# Patient Record
Sex: Male | Born: 1950
Health system: Southern US, Community
[De-identification: ages and names within clinical notes are randomized; demographics above are authoritative.]

## PROBLEM LIST (undated history)

## (undated) DIAGNOSIS — Z8489 Family history of other specified conditions: Secondary | ICD-10-CM

## (undated) DIAGNOSIS — K449 Diaphragmatic hernia without obstruction or gangrene: Secondary | ICD-10-CM

## (undated) DIAGNOSIS — Z860101 Personal history of adenomatous and serrated colon polyps: Secondary | ICD-10-CM

## (undated) DIAGNOSIS — K760 Fatty (change of) liver, not elsewhere classified: Secondary | ICD-10-CM

## (undated) DIAGNOSIS — C189 Malignant neoplasm of colon, unspecified: Secondary | ICD-10-CM

## (undated) DIAGNOSIS — T7840XA Allergy, unspecified, initial encounter: Secondary | ICD-10-CM

## (undated) DIAGNOSIS — J45909 Unspecified asthma, uncomplicated: Secondary | ICD-10-CM

## (undated) DIAGNOSIS — M1990 Unspecified osteoarthritis, unspecified site: Secondary | ICD-10-CM

## (undated) DIAGNOSIS — K219 Gastro-esophageal reflux disease without esophagitis: Secondary | ICD-10-CM

## (undated) DIAGNOSIS — Z86010 Personal history of colonic polyps: Secondary | ICD-10-CM

## (undated) DIAGNOSIS — N200 Calculus of kidney: Secondary | ICD-10-CM

## (undated) DIAGNOSIS — C61 Malignant neoplasm of prostate: Secondary | ICD-10-CM

## (undated) DIAGNOSIS — E785 Hyperlipidemia, unspecified: Secondary | ICD-10-CM

## (undated) DIAGNOSIS — D126 Benign neoplasm of colon, unspecified: Secondary | ICD-10-CM

## (undated) DIAGNOSIS — K648 Other hemorrhoids: Secondary | ICD-10-CM

## (undated) DIAGNOSIS — Z87442 Personal history of urinary calculi: Secondary | ICD-10-CM

## (undated) DIAGNOSIS — I10 Essential (primary) hypertension: Secondary | ICD-10-CM

## (undated) DIAGNOSIS — Z973 Presence of spectacles and contact lenses: Secondary | ICD-10-CM

## (undated) HISTORY — DX: Allergy, unspecified, initial encounter: T78.40XA

## (undated) HISTORY — DX: Other hemorrhoids: K64.8

## (undated) HISTORY — DX: Hyperlipidemia, unspecified: E78.5

## (undated) HISTORY — PX: VASECTOMY: SHX75

## (undated) HISTORY — DX: Gastro-esophageal reflux disease without esophagitis: K21.9

## (undated) HISTORY — DX: Essential (primary) hypertension: I10

## (undated) HISTORY — DX: Unspecified asthma, uncomplicated: J45.909

## (undated) HISTORY — DX: Calculus of kidney: N20.0

## (undated) HISTORY — PX: COLONOSCOPY: SHX174

## (undated) HISTORY — DX: Hereditary hemochromatosis: E83.110

## (undated) HISTORY — DX: Benign neoplasm of colon, unspecified: D12.6

## (undated) HISTORY — DX: Malignant neoplasm of colon, unspecified: C18.9

---
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
## 2003-12-25 HISTORY — PX: BUNIONECTOMY: SHX129

## 2011-12-28 ENCOUNTER — Ambulatory Visit: Payer: Managed Care, Other (non HMO)

## 2011-12-28 DIAGNOSIS — M25569 Pain in unspecified knee: Secondary | ICD-10-CM

## 2011-12-28 DIAGNOSIS — R05 Cough: Secondary | ICD-10-CM

## 2011-12-28 DIAGNOSIS — R059 Cough, unspecified: Secondary | ICD-10-CM

## 2012-06-24 ENCOUNTER — Telehealth: Payer: Self-pay

## 2012-06-24 NOTE — Telephone Encounter (Signed)
rx approved for 36 months.  lmom for pt and called pharmacy.  Pharmacy ran rx thru with no problem

## 2012-06-24 NOTE — Telephone Encounter (Signed)
VICKI STATES SHE AND HER HUSBAND HAVE BEEN DEALING WITH HIS MEDICINE FOR 2 WKS NOW AND HE IS DOWN TO HIS LAST PILL AND THEY WANT THIS TAKEN CARE OF ASAP. IT IS HIS OMEPRAZOLE. PLEASE CALL [phone]

## 2012-07-22 ENCOUNTER — Ambulatory Visit (INDEPENDENT_AMBULATORY_CARE_PROVIDER_SITE_OTHER): Payer: Managed Care, Other (non HMO) | Admitting: Emergency Medicine

## 2012-07-22 ENCOUNTER — Encounter: Payer: Self-pay | Admitting: Emergency Medicine

## 2012-07-22 ENCOUNTER — Telehealth: Payer: Self-pay

## 2012-07-22 VITALS — BP 138/94 | HR 77 | Temp 98.1°F | Resp 16 | Ht 70.0 in | Wt 223.0 lb

## 2012-07-22 DIAGNOSIS — K219 Gastro-esophageal reflux disease without esophagitis: Secondary | ICD-10-CM

## 2012-07-22 DIAGNOSIS — Z0000 Encounter for general adult medical examination without abnormal findings: Secondary | ICD-10-CM

## 2012-07-22 LAB — COMPREHENSIVE METABOLIC PANEL
ALT: 60 U/L — ABNORMAL HIGH (ref 0–53)
AST: 37 U/L (ref 0–37)
Albumin: 4.7 g/dL (ref 3.5–5.2)
Alkaline Phosphatase: 93 U/L (ref 39–117)
BUN: 22 mg/dL (ref 6–23)
CO2: 22 mEq/L (ref 19–32)
Calcium: 9.4 mg/dL (ref 8.4–10.5)
Chloride: 105 mEq/L (ref 96–112)
Creat: 1.09 mg/dL (ref 0.50–1.35)
Glucose, Bld: 107 mg/dL — ABNORMAL HIGH (ref 70–99)
Potassium: 4.3 mEq/L (ref 3.5–5.3)
Sodium: 141 mEq/L (ref 135–145)
Total Bilirubin: 0.9 mg/dL (ref 0.3–1.2)
Total Protein: 6.6 g/dL (ref 6.0–8.3)

## 2012-07-22 LAB — POCT UA - MICROSCOPIC ONLY
Bacteria, U Microscopic: NEGATIVE
Casts, Ur, LPF, POC: NEGATIVE
Crystals, Ur, HPF, POC: NEGATIVE
Yeast, UA: NEGATIVE

## 2012-07-22 LAB — CBC WITH DIFFERENTIAL/PLATELET
Basophils Absolute: 0.1 10*3/uL (ref 0.0–0.1)
Basophils Relative: 1 % (ref 0–1)
Eosinophils Absolute: 0.3 10*3/uL (ref 0.0–0.7)
Eosinophils Relative: 6 % — ABNORMAL HIGH (ref 0–5)
HCT: 45.1 % (ref 39.0–52.0)
Hemoglobin: 16.6 g/dL (ref 13.0–17.0)
Lymphocytes Relative: 22 % (ref 12–46)
Lymphs Abs: 1.1 10*3/uL (ref 0.7–4.0)
MCH: 32.4 pg (ref 26.0–34.0)
MCHC: 36.8 g/dL — ABNORMAL HIGH (ref 30.0–36.0)
MCV: 88.1 fL (ref 78.0–100.0)
Monocytes Absolute: 0.5 10*3/uL (ref 0.1–1.0)
Monocytes Relative: 9 % (ref 3–12)
Neutro Abs: 3.3 10*3/uL (ref 1.7–7.7)
Neutrophils Relative %: 62 % (ref 43–77)
Platelets: 246 10*3/uL (ref 150–400)
RBC: 5.12 MIL/uL (ref 4.22–5.81)
RDW: 12.6 % (ref 11.5–15.5)
WBC: 5.3 10*3/uL (ref 4.0–10.5)

## 2012-07-22 LAB — POCT URINALYSIS DIPSTICK
Bilirubin, UA: NEGATIVE
Blood, UA: NEGATIVE
Glucose, UA: NEGATIVE
Ketones, UA: NEGATIVE
Leukocytes, UA: NEGATIVE
Nitrite, UA: NEGATIVE
Protein, UA: NEGATIVE
Spec Grav, UA: 1.02
Urobilinogen, UA: 0.2
pH, UA: 7

## 2012-07-22 LAB — LIPID PANEL
Cholesterol: 190 mg/dL (ref 0–200)
HDL: 43 mg/dL (ref >39)
LDL Cholesterol: 103 mg/dL — ABNORMAL HIGH (ref 0–99)
Total CHOL/HDL Ratio: 4.4 Ratio
Triglycerides: 222 mg/dL — ABNORMAL HIGH (ref <150)
VLDL: 44 mg/dL — ABNORMAL HIGH (ref 0–40)

## 2012-07-22 LAB — IFOBT (OCCULT BLOOD): IFOBT: NEGATIVE

## 2012-07-22 LAB — PSA: PSA: 1.67 ng/mL (ref <=4.00)

## 2012-07-22 LAB — TSH: TSH: 1.749 u[IU]/mL (ref 0.350–4.500)

## 2012-07-22 NOTE — Telephone Encounter (Signed)
Pt would like to come by and pick up a copy of his lab results from 07/22/12

## 2012-07-22 NOTE — Progress Notes (Signed)
  Subjective:    Patient ID: Trisha Morandi, male    DOB: 08/14/1951, 61 y.o.   MRN: [id number]  HPI    Review of Systems  Constitutional: Negative.   HENT: Negative.   Eyes: Negative.   Respiratory: Negative.   Cardiovascular: Negative.   Gastrointestinal: Negative.   Genitourinary: Negative.   Musculoskeletal: Negative.   Skin: Negative.   Neurological: Negative.   Hematological: Negative.   Psychiatric/Behavioral: Negative.        Objective:   Physical Exam HEENT exam is unremarkable. His neck is supple. Thyroid not enlarged. Chest clear to auscultation and percussion. Heart regular rate no murmurs. Abdomen soft nontender. Extremities without edema. He has an absent right patella. Extremities are without edema. Pulses are 2+ and symmetrical. Ekg shows J-point depression no acute changes  Results for orders placed in visit on 07/22/12  IFOBT (OCCULT BLOOD)      Component Value Range   IFOBT Negative    POCT URINALYSIS DIPSTICK      Component Value Range   Color, UA yellow     Clarity, UA slightly cloudy     Glucose, UA neg     Bilirubin, UA neg     Ketones, UA neg     Spec Grav, UA 1.020     Blood, UA neg     pH, UA 7.0     Protein, UA neg     Urobilinogen, UA 0.2     Nitrite, UA neg     Leukocytes, UA Negative    POCT UA - MICROSCOPIC ONLY      Component Value Range   WBC, Ur, HPF, POC 0-4     RBC, urine, microscopic 0-2     Bacteria, U Microscopic neg     Mucus, UA small     Epithelial cells, urine per micros 0-3     Crystals, Ur, HPF, POC neg     Casts, Ur, LPF, POC neg     Yeast, UA neg         Assessment & Plan:  Patient is stable at present. He is up-to-date on colonoscopy. No change in current treatment protocol the I encouraged him to lose weight and exercise. We'll recheck on yearly basis.

## 2012-07-22 NOTE — Progress Notes (Signed)
  Subjective:    Patient ID: Randall Turner, male    DOB: 12-20-1951, 61 y.o.   MRN: [id number]  HPI    Review of Systems     Objective:   Physical Exam        Assessment & Plan:

## 2012-07-23 NOTE — Telephone Encounter (Signed)
Records are ready for patient to come by and pick-up at 102.  Please call patient to inform patient.

## 2012-07-23 NOTE — Telephone Encounter (Signed)
LMOM regarding lab results are ready for pick-up.

## 2012-09-08 ENCOUNTER — Other Ambulatory Visit: Payer: Self-pay | Admitting: Physician Assistant

## 2012-11-11 ENCOUNTER — Ambulatory Visit: Payer: Managed Care, Other (non HMO)

## 2012-11-11 ENCOUNTER — Ambulatory Visit: Payer: Managed Care, Other (non HMO) | Admitting: Internal Medicine

## 2012-11-11 VITALS — BP 128/72 | HR 98 | Temp 98.0°F | Resp 17 | Ht 70.0 in | Wt 229.0 lb

## 2012-11-11 DIAGNOSIS — M25561 Pain in right knee: Secondary | ICD-10-CM

## 2012-11-11 DIAGNOSIS — M1990 Unspecified osteoarthritis, unspecified site: Secondary | ICD-10-CM

## 2012-11-11 DIAGNOSIS — M25569 Pain in unspecified knee: Secondary | ICD-10-CM

## 2012-11-11 NOTE — Patient Instructions (Signed)
Osteoarthritis Osteoarthritis is the most common form of arthritis. It is redness, soreness, and swelling (inflammation) affecting the cartilage. Cartilage acts as a cushion, covering the ends of bones where they meet to form a joint. CAUSES  Over time, the cartilage begins to wear away. This causes bone to rub on bone. This produces pain and stiffness in the affected joints. Factors that contribute to this problem are:  Excessive body weight.  Age.  Overuse of joints. SYMPTOMS   People with osteoarthritis usually experience joint pain, swelling, or stiffness.  Over time, the joint may lose its normal shape.  Small deposits of bone (osteophytes) may grow on the edges of the joint.  Bits of bone or cartilage can break off and float inside the joint space. This may cause more pain and damage.  Osteoarthritis can lead to depression, anxiety, feelings of helplessness, and limitations on daily activities. The most commonly affected joints are in the:  Ends of the fingers.  Thumbs.  Neck.  Lower back.  Knees.  Hips. DIAGNOSIS  Diagnosis is mostly based on your symptoms and exam. Tests may be helpful, including:  X-rays of the affected joint.  A computerized magnetic scan (MRI).  Blood tests to rule out other types of arthritis.  Joint fluid tests. This involves using a needle to draw fluid from the joint and examining the fluid under a microscope. TREATMENT  Goals of treatment are to control pain, improve joint function, maintain a normal body weight, and maintain a healthy lifestyle. Treatment approaches may include:  A prescribed exercise program with rest and joint relief.  Weight control with nutritional education.  Pain relief techniques such as:  Properly applied heat and cold.  Electric pulses delivered to nerve endings under the skin (transcutaneous electrical nerve stimulation, TENS).  Massage.  Certain supplements. Ask your caregiver before using any  supplements, especially in combination with prescribed drugs.  Medicines to control pain, such as:  Acetaminophen.  Nonsteroidal anti-inflammatory drugs (NSAIDs), such as naproxen.  Narcotic or central-acting agents, such as tramadol. This drug carries a risk of addiction and is generally prescribed for short-term use.  Corticosteroids. These can be given orally or as injection. This is a short-term treatment, not recommended for routine use.  Surgery to reposition the bones and relieve pain (osteotomy) or to remove loose pieces of bone and cartilage. Joint replacement may be needed in advanced states of osteoarthritis. HOME CARE INSTRUCTIONS  Your caregiver can recommend specific types of exercise. These may include:  Strengthening exercises. These are done to strengthen the muscles that support joints affected by arthritis. They can be performed with weights or with exercise bands to add resistance.  Aerobic activities. These are exercises, such as brisk walking or low-impact aerobics, that get your heart pumping. They can help keep your lungs and circulatory system in shape.  Range-of-motion activities. These keep your joints limber.  Balance and agility exercises. These help you maintain daily living skills. Learning about your condition and being actively involved in your care will help improve the course of your osteoarthritis. SEEK MEDICAL CARE IF:   You feel hot or your skin turns red.  You develop a rash in addition to your joint pain.  You have an oral temperature above 102 F (38.9 C). [place] of Arthritis and Musculoskeletal and Skin Diseases: www.niams.SouthExposed.es [employer] on Aging: http://kim-miller.com/ American College of Rheumatology: www.rheumatology.org Document Released: 12/10/2005 Document Revised: 03/03/2012 Document Reviewed: 03/23/2010 [facility] Patient Information 2013 [place].  Wear and Tear Disorders of the Knee  (Arthritis, Osteoarthritis) Everyone will experience wear and tear injuries (arthritis, osteoarthritis) of the knee. These are the changes we all get as we age. They come from the joint stress of daily living. The amount of cartilage damage in your knee and your symptoms determine if you need surgery. Mild problems require approximately two months recovery time. More severe problems take several months to recover. With mild problems, your surgeon may find worn and rough cartilage surfaces. With severe changes, your surgeon may find cartilage that has completely worn away and exposed the bone. Loose bodies of bone and cartilage, bone spurs (excess bone growth), and injuries to the menisci (cushions between the large bones of your leg) are also common. All of these problems can cause pain. For a mild wear and tear problem, rough cartilage may simply need to be shaved and smoothed. For more severe problems with areas of exposed bone, your surgeon may use an instrument for roughing up the bone surfaces to stimulate new cartilage growth. Loose bodies are usually removed. Torn menisci may be trimmed or repaired. ABOUT THE ARTHROSCOPIC PROCEDURE Arthroscopy is a surgical technique. It allows your orthopedic surgeon to diagnose and treat your knee injury with accuracy. The surgeon looks into your knee through a small scope. The scope is like a small (pencil-sized) telescope. Arthroscopy is less invasive than open knee surgery. You can expect a more rapid recovery. After the procedure, you will be moved to a recovery area until most of the effects of the medication have worn off. Your caregiver will discuss the test results with you. RECOVERY The severity of the arthritis and the type of procedure performed will determine recovery time. Other important factors include age, physical condition, medical conditions, and the type of rehabilitation program. Strengthening your muscles after arthroscopy helps guarantee a  better recovery. Follow your caregiver's instructions. Use crutches, rest, elevate, ice, and do knee exercises as instructed. Your caregivers will help you and instruct you with exercises and other physical therapy required to regain your mobility, muscle strength, and functioning following surgery. Only take over-the-counter or prescription medicines for pain, discomfort, or fever as directed by your caregiver.  SEEK MEDICAL CARE IF:   There is increased bleeding (more than a small spot) from the wound.  You notice redness, swelling, or increasing pain in the wound.  Pus is coming from wound.  You develop an unexplained oral temperature above 102 F (38.9 C) , or as your caregiver suggests.  You notice a foul smell coming from the wound or dressing.  You have severe pain with motion of the knee. SEEK IMMEDIATE MEDICAL CARE IF:   You develop a rash.  You have difficulty breathing.  You have any allergic problems. MAKE SURE YOU:   Understand these instructions.  Will watch your condition.  Will get help right away if you are not doing well or get worse. Document Released: 12/07/2000 Document Revised: 03/03/2012 Document Reviewed: 05/05/2008 [facility] Patient Information 2013 [place], [place].

## 2012-11-11 NOTE — Progress Notes (Signed)
  Subjective:    Patient ID: Randall Turner, male    DOB: 1951-04-19, 61 y.o.   MRN: [id number]  HPI Knee pain for 35 yrs Hx of serious knee surgery at 17 Has weakness.   Review of Systems     Objective:   Physical Exam Swollen knee Good rom Joint stable Muscles weak NMS intact  [facility] reading (PRIMARY) by  Dr.Ariyana Faw patella disintegrated, DJD,chondrcalcinosis.       Assessment & Plan:  Pain/Swelling knee Refer to Dr. Berton Lan

## 2013-03-18 ENCOUNTER — Other Ambulatory Visit: Payer: Self-pay | Admitting: Physician Assistant

## 2013-06-15 ENCOUNTER — Other Ambulatory Visit: Payer: Self-pay | Admitting: Physician Assistant

## 2013-07-28 ENCOUNTER — Ambulatory Visit (INDEPENDENT_AMBULATORY_CARE_PROVIDER_SITE_OTHER): Payer: Managed Care, Other (non HMO) | Admitting: Emergency Medicine

## 2013-07-28 ENCOUNTER — Encounter: Payer: Self-pay | Admitting: Emergency Medicine

## 2013-07-28 VITALS — BP 134/96 | HR 84 | Temp 97.5°F | Resp 16 | Ht 70.0 in | Wt 225.0 lb

## 2013-07-28 DIAGNOSIS — K219 Gastro-esophageal reflux disease without esophagitis: Secondary | ICD-10-CM | POA: Insufficient documentation

## 2013-07-28 DIAGNOSIS — Z0000 Encounter for general adult medical examination without abnormal findings: Secondary | ICD-10-CM

## 2013-07-28 DIAGNOSIS — R7309 Other abnormal glucose: Secondary | ICD-10-CM

## 2013-07-28 DIAGNOSIS — R739 Hyperglycemia, unspecified: Secondary | ICD-10-CM | POA: Insufficient documentation

## 2013-07-28 DIAGNOSIS — E785 Hyperlipidemia, unspecified: Secondary | ICD-10-CM

## 2013-07-28 DIAGNOSIS — Z1211 Encounter for screening for malignant neoplasm of colon: Secondary | ICD-10-CM

## 2013-07-28 DIAGNOSIS — R635 Abnormal weight gain: Secondary | ICD-10-CM

## 2013-07-28 DIAGNOSIS — R8281 Pyuria: Secondary | ICD-10-CM

## 2013-07-28 LAB — CBC WITH DIFFERENTIAL/PLATELET
Basophils Absolute: 0.1 10*3/uL (ref 0.0–0.1)
Basophils Relative: 1 % (ref 0–1)
Eosinophils Absolute: 0.3 10*3/uL (ref 0.0–0.7)
Eosinophils Relative: 5 % (ref 0–5)
HCT: 48.4 % (ref 39.0–52.0)
Hemoglobin: 17.6 g/dL — ABNORMAL HIGH (ref 13.0–17.0)
Lymphocytes Relative: 30 % (ref 12–46)
Lymphs Abs: 1.5 10*3/uL (ref 0.7–4.0)
MCH: 32.2 pg (ref 26.0–34.0)
MCHC: 36.4 g/dL — ABNORMAL HIGH (ref 30.0–36.0)
MCV: 88.6 fL (ref 78.0–100.0)
Monocytes Absolute: 0.5 10*3/uL (ref 0.1–1.0)
Monocytes Relative: 9 % (ref 3–12)
Neutro Abs: 2.8 10*3/uL (ref 1.7–7.7)
Neutrophils Relative %: 55 % (ref 43–77)
Platelets: 229 10*3/uL (ref 150–400)
RBC: 5.46 MIL/uL (ref 4.22–5.81)
RDW: 12.6 % (ref 11.5–15.5)
WBC: 5.2 10*3/uL (ref 4.0–10.5)

## 2013-07-28 LAB — POCT UA - MICROSCOPIC ONLY
Bacteria, U Microscopic: NEGATIVE
Casts, Ur, LPF, POC: NEGATIVE
Crystals, Ur, HPF, POC: NEGATIVE
Yeast, UA: NEGATIVE

## 2013-07-28 LAB — POCT GLYCOSYLATED HEMOGLOBIN (HGB A1C): Hemoglobin A1C: 4.8

## 2013-07-28 LAB — POCT URINALYSIS DIPSTICK
Bilirubin, UA: NEGATIVE
Glucose, UA: NEGATIVE
Ketones, UA: NEGATIVE
Nitrite, UA: NEGATIVE
Protein, UA: NEGATIVE
Spec Grav, UA: 1.025
Urobilinogen, UA: 0.2
pH, UA: 5.5

## 2013-07-28 LAB — COMPREHENSIVE METABOLIC PANEL
ALT: 46 U/L (ref 0–53)
AST: 31 U/L (ref 0–37)
Albumin: 4.5 g/dL (ref 3.5–5.2)
Alkaline Phosphatase: 93 U/L (ref 39–117)
BUN: 21 mg/dL (ref 6–23)
CO2: 25 mEq/L (ref 19–32)
Calcium: 9.5 mg/dL (ref 8.4–10.5)
Chloride: 106 mEq/L (ref 96–112)
Creat: 1.34 mg/dL (ref 0.50–1.35)
Glucose, Bld: 114 mg/dL — ABNORMAL HIGH (ref 70–99)
Potassium: 4.3 mEq/L (ref 3.5–5.3)
Sodium: 140 mEq/L (ref 135–145)
Total Bilirubin: 1.1 mg/dL (ref 0.3–1.2)
Total Protein: 6.8 g/dL (ref 6.0–8.3)

## 2013-07-28 LAB — LIPID PANEL
Cholesterol: 197 mg/dL (ref 0–200)
HDL: 51 mg/dL (ref >39)
LDL Cholesterol: 112 mg/dL — ABNORMAL HIGH (ref 0–99)
Total CHOL/HDL Ratio: 3.9 Ratio
Triglycerides: 168 mg/dL — ABNORMAL HIGH (ref <150)
VLDL: 34 mg/dL (ref 0–40)

## 2013-07-28 LAB — PSA: PSA: 1.9 ng/mL (ref <=4.00)

## 2013-07-28 LAB — IFOBT (OCCULT BLOOD): IFOBT: NEGATIVE

## 2013-07-28 LAB — TSH: TSH: 2.229 u[IU]/mL (ref 0.350–4.500)

## 2013-07-28 MED ORDER — OMEPRAZOLE 20 MG PO CPDR: 20.0000 mg | DELAYED_RELEASE_CAPSULE | Freq: Every day | ORAL | Status: DC

## 2013-07-28 NOTE — Progress Notes (Signed)
@[facility] @  Patient ID: Randall Turner MRN: [id number], DOB: 11/24/1951 62 y.o. Date of Encounter: 07/28/2013, 8:08 AM  Primary Physician: No primary provider on file.  Chief Complaint: Physical (CPE)  HPI: 62 y.o. y/o male with history noted below here for CPE.  Doing well. No issues/complaints.  Review of Systems:  Consitutional: No fever, chills, fatigue, night sweats, lymphadenopathy, or weight changes. Eyes: No visual changes, eye redness, or discharge. ENT/Mouth: Ears: No otalgia, tinnitus, hearing loss, discharge. Nose: No congestion, rhinorrhea, sinus pain, or epistaxis. Throat: No sore throat, post nasal drip, or teeth pain. Cardiovascular: No CP, palpitations, diaphoresis, DOE, edema, orthopnea, PND. Respiratory: No cough, hemoptysis, SOB, or wheezing. Gastrointestinal: No anorexia, dysphagia, reflux, pain, nausea, vomiting, hematemesis, diarrhea, constipation, BRBPR, or melena. Genitourinary: No dysuria, frequency, urgency, hematuria, incontinence, nocturia, decreased urinary stream, discharge, impotence, or testicular pain/masses. Musculoskeletal: No decreased ROM, myalgias, stiffness, joint swelling, or weakness. Skin: No rash, erythema, lesion changes, pain, warmth, jaundice, or pruritis. Neurological: No headache, dizziness, syncope, seizures, tremors, memory loss, coordination problems, or paresthesias. Psychological: No anxiety, depression, hallucinations, SI/HI. Endocrine: No fatigue, polydipsia, polyphagia, polyuria, or known diabetes. All other systems were reviewed and are otherwise negative.  Past Medical History  Diagnosis Date  . Allergy   . GERD (gastroesophageal reflux disease)   . Asthma      Past Surgical History  Procedure Laterality Date  . Vasectomy    . Patella removed right knee      Home Meds:  Prior to Admission medications   Medication Sig Start Date End Date Taking? Authorizing Provider  omeprazole (PRILOSEC) 20 MG capsule Take 1  capsule (20 mg total) by mouth daily. PATIENT NEEDS OFFICE VISIT FOR ADDITIONAL REFILLS 06/15/13  Yes Collene Gobble, MD    Allergies:  Allergies  Allergen Reactions  . Cortisone     swelling    History   Social History  . Marital Status: Married    Spouse Name: N/A    Number of Children: N/A  . Years of Education: N/A   Occupational History  . Not on file.   Social History Main Topics  . Smoking status: Never Smoker   . Smokeless tobacco: Not on file  . Alcohol Use: Yes     Comment: ocas  . Drug Use: No  . Sexually Active: Not on file   Other Topics Concern  . Not on file   Social History Narrative  . No narrative on file    Family History  Problem Relation Age of Onset  . Cancer Mother   . Cancer Father     Physical Exam:  Blood pressure 134/96, pulse 84, temperature 97.5 F (36.4 C), temperature source Oral, resp. rate 16, height 5\' 10"  (1.778 m), weight 225 lb (102.059 kg).  General: Well developed, well nourished, in no acute distress. HEENT: Normocephalic, atraumatic. Conjunctiva pink, sclera non-icteric. Pupils 2 mm constricting to 1 mm, round, regular, and equally reactive to light and accomodation. EOMI. Internal auditory canal clear. TMs with good cone of light and without pathology. Nasal mucosa pink. Nares are without discharge. No sinus tenderness. Oral mucosa pink. Dentition . Pharynx without exudate.   Neck: Supple. Trachea midline. No thyromegaly. Full ROM. No lymphadenopathy. Lungs: Clear to auscultation bilaterally without wheezes, rales, or rhonchi. Breathing is of normal effort and unlabored. Cardiovascular: RRR with S1 S2. No murmurs, rubs, or gallops appreciated. Distal pulses 2+ symmetrically. No carotid or abdominal bruits.  Abdomen: Soft, non-tender, non-distended with normoactive bowel sounds. No hepatosplenomegaly  or masses. No rebound/guarding. No CVA tenderness. Without hernias.  Rectal: No external hemorrhoids or fissures. Rectal vault  without masses.   Genitourinary:   circumcised male. No penile lesions. Testes descended bilaterally, and smooth without tenderness or masses.  Musculoskeletal: Full range of motion and 5/5 strength throughout. Without swelling, atrophy, tenderness, crepitus, or warmth. Extremities without clubbing, cyanosis, or edema. Calves supple. Skin: Warm and moist without erythema, ecchymosis, wounds, or rash. Neuro: A+Ox3. CN II-XII grossly intact. Moves all extremities spontaneously. Full sensation throughout. Normal gait. DTR 2+ throughout upper and lower extremities. Finger to nose intact. Psych:  Responds to questions appropriately with a normal affect.   Studies: CBC, CMET, Lipid, PSA, TSH,Hba1c      Assessment/Plan:  62 y.o. y/o male here for complete physical. He is due for his colonoscopy and endoscopy evaluation. He is up-to-date on his immunizations. He needs to have a flu shot this year. I've encouraged him to increase his exercise. Overall he is healthy and doing well. Blood pressure was borderline elevated I think he can treat this with changes in his exercise program and weight loss.  -  Signed, Earl Lites, MD 07/28/2013 8:08 AM

## 2013-07-29 LAB — URINE CULTURE
Colony Count: NO GROWTH
Organism ID, Bacteria: NO GROWTH

## 2013-08-03 ENCOUNTER — Encounter: Payer: Self-pay | Admitting: Internal Medicine

## 2013-08-04 ENCOUNTER — Telehealth: Payer: Self-pay

## 2013-08-04 NOTE — Telephone Encounter (Signed)
Called him. Labs look good. Left message copy mailed

## 2013-08-04 NOTE — Telephone Encounter (Signed)
PT CAME BY 104 FOR LAB RESULTS. RAMONA SAID THEY HAD NOT BEEN REVIEWED YET. PLEASE CONTACT PT AFTER REVIEW.

## 2013-10-05 ENCOUNTER — Ambulatory Visit (AMBULATORY_SURGERY_CENTER): Payer: Self-pay

## 2013-10-05 ENCOUNTER — Encounter: Payer: Self-pay | Admitting: Internal Medicine

## 2013-10-05 VITALS — Ht 71.0 in | Wt 225.0 lb

## 2013-10-05 DIAGNOSIS — Z1211 Encounter for screening for malignant neoplasm of colon: Secondary | ICD-10-CM

## 2013-10-05 MED ORDER — MOVIPREP 100 G PO SOLR: 1.0000 | Freq: Once | ORAL | Status: DC

## 2013-10-19 ENCOUNTER — Ambulatory Visit (AMBULATORY_SURGERY_CENTER): Payer: Managed Care, Other (non HMO) | Admitting: Internal Medicine

## 2013-10-19 ENCOUNTER — Encounter: Payer: Self-pay | Admitting: Internal Medicine

## 2013-10-19 VITALS — BP 136/57 | HR 61 | Temp 98.5°F | Resp 10 | Ht 71.0 in | Wt 225.0 lb

## 2013-10-19 DIAGNOSIS — D126 Benign neoplasm of colon, unspecified: Secondary | ICD-10-CM

## 2013-10-19 DIAGNOSIS — Z1211 Encounter for screening for malignant neoplasm of colon: Secondary | ICD-10-CM

## 2013-10-19 MED ORDER — SODIUM CHLORIDE 0.9 % IV SOLN: 500.0000 mL | INTRAVENOUS | Status: DC

## 2013-10-19 NOTE — Op Note (Signed)
[facility] [address] [place], [address]   COLONOSCOPY PROCEDURE REPORT  PATIENT: Randall, Turner  MR#: [id number] BIRTHDATE: 04-08-1951 , 61  yrs. old GENDER: Male ENDOSCOPIST: Beverley Fiedler, MD REFERRED XB:[id number] Cleta Alberts, M.D. PROCEDURE DATE:  10/19/2013 PROCEDURE:   Colonoscopy with biopsy, Colonoscopy with snare polypectomy, and Colonoscopy with cold biopsy polypectomy First Screening Colonoscopy - Avg.  risk and is 50 yrs.  old or older - No.  Prior Negative Screening - Now for repeat screening. 10 or more years since last screening  History of Adenoma - Now for follow-up colonoscopy & has been > or = to 3 yrs.  N/A  Polyps Removed Today? Yes. ASA CLASS:   Class II INDICATIONS:average risk screening. MEDICATIONS: MAC sedation, administered by CRNA, Propofol (Diprivan), and Propofol (Diprivan) 380 mg IV  DESCRIPTION OF PROCEDURE:   After the risks benefits and alternatives of the procedure were thoroughly explained, informed consent was obtained.  A digital rectal exam revealed no rectal mass.   The LB [id number]  endoscope was introduced through the anus and advanced to the cecum, which was identified by both the appendix and ileocecal valve. No adverse events experienced. The quality of the prep was Moviprep fair  The instrument was then slowly withdrawn as the colon was fully examined.    COLON FINDINGS: An appendiceal fecalith was seen without surrounding inflammatory changes.  Three sessile polyps measuring 3-4 mm in size were found in the ascending colon (2) and rectosigmoid colon (1).  Polypectomy was performed with cold forceps.  All resections were complete and all polyp tissue was completely retrieved.   A flat polyp measuring 6-7 mm in size was found in the transverse colon.  A polypectomy was performed with a cold snare.  The resection was complete and the polyp tissue was completely retrieved.   Two submucosal lesions measuring  approximately 1 cm in size in the sigmoid colon, query lipoma.  Biopsies in bite-on-bite fashion was taken from the most distal submucosal lesion.   Mild diverticulosis was noted in the descending colon and sigmoid colon. Retroflexed views revealed external hemorrhoids. The time to cecum=3 minutes 09 seconds.  Withdrawal time=19 minutes 36 seconds. The scope was withdrawn and the procedure completed.  COMPLICATIONS: There were no complications.  ENDOSCOPIC IMPRESSION: 1.   Three sessile polyps measuring 3-4 mm in size were found in the ascending colon and rectosigmoid colon; Polypectomy was performed with cold forceps 2.   Flat polyp measuring 6-7 mm in size was found in the transverse colon; polypectomy was performed with a cold snare 3.   Two submucosal lesions measuring approximately 1 cm in size in the sigmoid colon, query lipoma.  Biopsy in bite-on-bite fashion was taken from the most distal submucosal lesion. 4.   Mild diverticulosis was noted in the descending colon and sigmoid colon  RECOMMENDATIONS: 1.  Await pathology results 2.  High fiber diet 3.  Timing of repeat colonoscopy will be determined by pathology findings. 4.  You will receive a letter within 1-2 weeks with the results of your biopsy as well as final recommendations.  Please call my office if you have not received a letter after 3 weeks.   eSigned:  Beverley Fiedler, MD 10/19/2013 11:12 AM   cc: The Patient and Lesle Chris, MD   PATIENT NAME:  Randall, Turner MR#: [id number]

## 2013-10-19 NOTE — Patient Instructions (Signed)
Discharge instructions given with verbal understanding. Handouts on polyps and diverticulosis and a high fiber diet. Resume previous medications. YOU HAD AN ENDOSCOPIC PROCEDURE TODAY AT THE [facility]: Refer to the procedure report that was given to you for any specific questions about what was found during the examination.  If the procedure report does not answer your questions, please call your gastroenterologist to clarify.  If you requested that your care partner not be given the details of your procedure findings, then the procedure report has been included in a sealed envelope for you to review at your convenience later.  YOU SHOULD EXPECT: Some feelings of bloating in the abdomen. Passage of more gas than usual.  Walking can help get rid of the air that was put into your GI tract during the procedure and reduce the bloating. If you had a lower endoscopy (such as a colonoscopy or flexible sigmoidoscopy) you may notice spotting of blood in your stool or on the toilet paper. If you underwent a bowel prep for your procedure, then you may not have a normal bowel movement for a few days.  DIET: Your first meal following the procedure should be a light meal and then it is ok to progress to your normal diet.  A half-sandwich or bowl of soup is an example of a good first meal.  Heavy or fried foods are harder to digest and may make you feel nauseous or bloated.  Likewise meals heavy in dairy and vegetables can cause extra gas to form and this can also increase the bloating.  Drink plenty of fluids but you should avoid alcoholic beverages for 24 hours.  ACTIVITY: Your care partner should take you home directly after the procedure.  You should plan to take it easy, moving slowly for the rest of the day.  You can resume normal activity the day after the procedure however you should NOT DRIVE or use heavy machinery for 24 hours (because of the sedation medicines used during the test).    SYMPTOMS  TO REPORT IMMEDIATELY: A gastroenterologist can be reached at any hour.  During normal business hours, 8:30 AM to 5:00 PM Monday through Friday, call [phone].  After hours and on weekends, please call the GI answering service at [phone] who will take a message and have the physician on call contact you.   Following lower endoscopy (colonoscopy or flexible sigmoidoscopy):  Excessive amounts of blood in the stool  Significant tenderness or worsening of abdominal pains  Swelling of the abdomen that is new, acute  Fever of 100F or higher  FOLLOW UP: If any biopsies were taken you will be contacted by phone or by letter within the next 1-3 weeks.  Call your gastroenterologist if you have not heard about the biopsies in 3 weeks.  Our staff will call the home number listed on your records the next business day following your procedure to check on you and address any questions or concerns that you may have at that time regarding the information given to you following your procedure. This is a courtesy call and so if there is no answer at the home number and we have not heard from you through the emergency physician on call, we will assume that you have returned to your regular daily activities without incident.  SIGNATURES/CONFIDENTIALITY: You and/or your care partner have signed paperwork which will be entered into your electronic medical record.  These signatures attest to the fact that that the information above  on your After Visit Summary has been reviewed and is understood.  Full responsibility of the confidentiality of this discharge information lies with you and/or your care-partner.

## 2013-10-19 NOTE — Progress Notes (Signed)
Report to pacu rn, vss, bbs=clear 

## 2013-10-19 NOTE — Progress Notes (Signed)
Called to room to assist during endoscopic procedure.  Patient ID and intended procedure confirmed with present staff. Received instructions for my participation in the procedure from the performing physician.  

## 2013-10-19 NOTE — Progress Notes (Signed)
Patient did not experience any of the following events: a burn prior to discharge; a fall within the facility; wrong site/side/patient/procedure/implant event; or a hospital transfer or hospital admission upon discharge from the facility. (G8907) Patient did not have preoperative order for IV antibiotic SSI prophylaxis. (G8918)  

## 2013-10-20 ENCOUNTER — Telehealth: Payer: Self-pay

## 2013-10-20 NOTE — Telephone Encounter (Signed)
No answer, left message

## 2013-10-25 ENCOUNTER — Encounter: Payer: Self-pay | Admitting: Internal Medicine

## 2013-11-19 ENCOUNTER — Encounter: Payer: Self-pay | Admitting: Family Medicine

## 2014-01-14 ENCOUNTER — Ambulatory Visit (INDEPENDENT_AMBULATORY_CARE_PROVIDER_SITE_OTHER): Payer: Managed Care, Other (non HMO) | Admitting: Physician Assistant

## 2014-01-14 ENCOUNTER — Encounter: Payer: Self-pay | Admitting: Physician Assistant

## 2014-01-14 VITALS — BP 148/90 | HR 86 | Temp 98.3°F | Resp 18 | Ht 70.0 in | Wt 231.0 lb

## 2014-01-14 DIAGNOSIS — R1011 Right upper quadrant pain: Secondary | ICD-10-CM

## 2014-01-14 NOTE — Progress Notes (Signed)
   Subjective:    Patient ID: Randall Turner, male    DOB: 1951/06/29, 63 y.o.   MRN: [id number]  HPI  [name] is a 63 year old male who presents today with complaint of sharp, shooting pain in RUQ on 01/10/14.   He has never had this happen before. He has experienced discomfort in the RUQ since 01/11/14 but has not had another sharp pain. No discomfort while standing but laying down is very uncomfortable which is causing difficulty sleeping at night. Last night reports had very minimal discomfort. Food does not affect the discomfort and he does not feel better or worse after eating.  Hx of hiatal hernia and GERD. Takes Prilosec to reduce acid in stomach. He notes the pain and discomfort he has been experiencing recently feels different than GERD sx.  Denies SOB, chest pain, nausea, vomiting or diarrhea.   Review of Systems As above.     Objective:   Physical Exam  Constitutional: He is oriented to person, place, and time. He appears well-developed and well-nourished.  Cardiovascular: Normal rate, regular rhythm and normal heart sounds.   Pulmonary/Chest: Effort normal and breath sounds normal.  Abdominal: Soft. Bowel sounds are normal. He exhibits no mass. There is tenderness (mild in RUQ to deep palpation ). There is no rebound and no guarding.  Neurological: He is alert and oriented to person, place, and time.  Psychiatric: He has a normal mood and affect. His speech is normal and behavior is normal. Judgment and thought content normal.       Assessment & Plan:   1. RUQ abdominal pain RUQ pain most likely due to gallstone. Ordered Abdominal US to evaluate for gallstones.  - US Abdomen Limited RUQ; Future

## 2014-01-14 NOTE — Patient Instructions (Signed)
If you have not heard anything regarding the referral in 1 week, please contact our office.

## 2014-01-15 NOTE — Progress Notes (Signed)
I have examined this patient along with the student and agree.  

## 2014-01-20 ENCOUNTER — Ambulatory Visit
Admission: RE | Admit: 2014-01-20 | Discharge: 2014-01-20 | Disposition: A | Discharge status: Home / Self Care | Payer: Managed Care, Other (non HMO) | Source: Ambulatory Visit | Attending: Physician Assistant | Admitting: Physician Assistant

## 2014-01-20 DIAGNOSIS — R1011 Right upper quadrant pain: Secondary | ICD-10-CM

## 2014-02-03 ENCOUNTER — Ambulatory Visit: Payer: Managed Care, Other (non HMO) | Admitting: Family Medicine

## 2014-02-03 ENCOUNTER — Ambulatory Visit: Payer: Managed Care, Other (non HMO)

## 2014-02-03 VITALS — BP 142/90 | HR 91 | Temp 97.8°F | Resp 18 | Ht 69.5 in | Wt 232.0 lb

## 2014-02-03 DIAGNOSIS — H409 Unspecified glaucoma: Secondary | ICD-10-CM

## 2014-02-03 DIAGNOSIS — S149XXA Injury of unspecified nerves of neck, initial encounter: Secondary | ICD-10-CM

## 2014-02-03 DIAGNOSIS — M542 Cervicalgia: Secondary | ICD-10-CM

## 2014-02-03 DIAGNOSIS — G589 Mononeuropathy, unspecified: Secondary | ICD-10-CM

## 2014-02-03 MED ORDER — PREDNISONE 20 MG PO TABS: ORAL_TABLET | ORAL | Status: DC

## 2014-02-03 MED ORDER — HYDROCODONE-ACETAMINOPHEN 5-325 MG PO TABS: 10.0000 | ORAL_TABLET | Freq: Four times a day (QID) | ORAL | Status: DC | PRN

## 2014-02-03 MED ORDER — HYDROCODONE-ACETAMINOPHEN 5-325 MG PO TABS: 1.0000 | ORAL_TABLET | Freq: Four times a day (QID) | ORAL | Status: DC | PRN
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
## 2014-02-03 NOTE — Addendum Note (Signed)
Addended by: Kem Boroughs D on: 02/03/2014 03:25 PM   Modules accepted: Orders

## 2014-02-03 NOTE — Patient Instructions (Signed)
Take prednisone 3 daily for 2 days, then 2 daily for 2 days, then one daily for 2 days. Best taken early in the day. With your history of a reaction to a cortisone cream so he discontinued this if there is any concern of allergic reaction, or seek medical attention if needed  Take the pain pills every 4-6 hours if needed for severe pain  When you're down to 1 prednisone daily begin taking naproxen 220 mg 2 pills twice daily for about 5 days.  Return if worse at anytime or if not continuing to improve dramatically over the next week.

## 2014-03-22 ENCOUNTER — Ambulatory Visit
Admission: RE | Admit: 2014-03-22 | Discharge: 2014-03-22 | Disposition: A | Discharge status: Home / Self Care | Payer: Managed Care, Other (non HMO) | Source: Ambulatory Visit | Attending: Emergency Medicine | Admitting: Emergency Medicine

## 2014-03-22 ENCOUNTER — Ambulatory Visit: Payer: Managed Care, Other (non HMO) | Admitting: Emergency Medicine

## 2014-03-22 ENCOUNTER — Ambulatory Visit: Payer: Managed Care, Other (non HMO)

## 2014-03-22 VITALS — BP 148/86 | HR 100 | Temp 97.9°F | Resp 16 | Ht 70.0 in | Wt 236.0 lb

## 2014-03-22 DIAGNOSIS — R319 Hematuria, unspecified: Secondary | ICD-10-CM

## 2014-03-22 LAB — POCT CBC
Granulocyte percent: 68.8 %G (ref 37–80)
HCT, POC: 48.7 % (ref 43.5–53.7)
Hemoglobin: 16.2 g/dL (ref 14.1–18.1)
Lymph, poc: 1.6 (ref 0.6–3.4)
MCH, POC: 31.1 pg (ref 27–31.2)
MCHC: 33.3 g/dL (ref 31.8–35.4)
MCV: 93.4 fL (ref 80–97)
MID (cbc): 0.7 (ref 0–0.9)
MPV: 10 fL (ref 0–99.8)
POC Granulocyte: 5 (ref 2–6.9)
POC LYMPH PERCENT: 21.8 %L (ref 10–50)
POC MID %: 9.4 %M (ref 0–12)
Platelet Count, POC: 263 10*3/uL (ref 142–424)
RBC: 5.21 M/uL (ref 4.69–6.13)
RDW, POC: 13.7 %
WBC: 7.2 10*3/uL (ref 4.6–10.2)

## 2014-03-22 LAB — POCT UA - MICROSCOPIC ONLY
Bacteria, U Microscopic: NEGATIVE
Casts, Ur, LPF, POC: NEGATIVE
Crystals, Ur, HPF, POC: NEGATIVE
Mucus, UA: POSITIVE
Yeast, UA: NEGATIVE

## 2014-03-22 LAB — POCT URINALYSIS DIPSTICK
Glucose, UA: 100
Leukocytes, UA: NEGATIVE
Nitrite, UA: NEGATIVE
Protein, UA: 100
Spec Grav, UA: 1.025
Urobilinogen, UA: 1
pH, UA: 7

## 2014-03-22 LAB — IFOBT (OCCULT BLOOD): IFOBT: NEGATIVE

## 2014-03-22 NOTE — Progress Notes (Signed)
   Subjective:    Patient ID: Randall Turner, male    DOB: 02-Feb-1951, 63 y.o.   MRN: [id number]  HPI patient states he was in his usual state of health until last night he felt somewhat nauseated before he went to bed. He had a vague discomfort on the left side of his abdomen. When he awakened in the night he urinated and had bright red blood with urination. Again when he urinated this morning there was blood again. He has a very mild discomfort left side of the abdomen.    Review of Systems     Objective:   Physical Exam patient is alert and cooperative he is not in distress. Neck is supple. Chest clear to auscultation and percussion. There is no CVA tenderness noted abdomen has very mild left lower to mid abdominal discomfort  [facility] reading (PRIMARY) by  [name]  no stones seen. No acute abnormalities Results for orders placed in visit on 03/22/14  POCT CBC      Result Value Ref Range   WBC 7.2  4.6 - 10.2 K/uL   Lymph, poc 1.6  0.6 - 3.4   POC LYMPH PERCENT 21.8  10 - 50 %L   MID (cbc) 0.7  0 - 0.9   POC MID % 9.4  0 - 12 %M   POC Granulocyte 5.0  2 - 6.9   Granulocyte percent 68.8  37 - 80 %G   RBC 5.21  4.69 - 6.13 M/uL   Hemoglobin 16.2  14.1 - 18.1 g/dL   HCT, POC 48.7  43.5 - 53.7 %   MCV 93.4  80 - 97 fL   MCH, POC 31.1  27 - 31.2 pg   MCHC 33.3  31.8 - 35.4 g/dL   RDW, POC 13.7     Platelet Count, POC 263  142 - 424 K/uL   MPV 10.0  0 - 99.8 fL  POCT UA - MICROSCOPIC ONLY      Result Value Ref Range   WBC, Ur, HPF, POC 0-1     RBC, urine, microscopic TNTC     Bacteria, U Microscopic neg     Mucus, UA pos     Epithelial cells, urine per micros 0-2     Crystals, Ur, HPF, POC neg     Casts, Ur, LPF, POC neg     Yeast, UA neg    POCT URINALYSIS DIPSTICK      Result Value Ref Range   Color, UA brown     Clarity, UA cloudy     Glucose, UA 100     Bilirubin, UA small     Ketones, UA trace     Spec Grav, UA 1.025     Blood, UA large     pH, UA 7.0     Protein, UA 100     Urobilinogen, UA 1.0     Nitrite, UA neg     Leukocytes, UA Negative    IFOBT (OCCULT BLOOD)      Result Value Ref Range   IFOBT Negative         Assessment & Plan:  The patient presents with hematuria with pain on the left side of his abdomen. He is sent to preferred imaging for a CT urogram he was also given a strainer

## 2014-03-23 ENCOUNTER — Encounter (HOSPITAL_COMMUNITY): Payer: Self-pay | Admitting: *Deleted

## 2014-03-23 ENCOUNTER — Other Ambulatory Visit: Payer: Self-pay | Admitting: Urology

## 2014-03-23 ENCOUNTER — Encounter (HOSPITAL_COMMUNITY): Payer: Self-pay | Admitting: Pharmacy Technician

## 2014-03-23 LAB — URINE CULTURE: Colony Count: 5000

## 2014-03-24 NOTE — H&P (Signed)
ctive Problems Problems   1. Calculus of kidney (592.0)  History of Present Illness  Mr. Villamizar is a 63 yo WM sent in consultation by [name] for a stone.  He had gross hematuria this morning with some LLQ discomfort.   He had a KUB at Urgent Care and nothing was seen so he was sent for a CT which showed a 67mm LUPJ stone.   He had some right sided pain about a month before and he had an abdominal US at that time but the kidneys weren't checked since they were checking out his liver.  He has no prior confirmed stones.    He has had a vasectomy but no other GU history.   Past Medical History Problems   1. History of asthma (V12.69)  2. History of esophageal reflux (V12.79)  Surgical History Problems   1. History of Foot Surgery  2. History of Knee Surgery  3. History of Surgery Of Male Genitalia Vasectomy  4. History of Tonsillectomy  Current Meds  1. Aspirin 81 MG Oral Tablet; takes 2 a day;  Therapy: (Recorded:30Mar2015) to Recorded  2. Omeprazole 20 MG Oral Tablet Delayed Release;  Therapy: (Recorded:30Mar2015) to Recorded  Allergies Medication   1. cortisone  Family History Problems   1. Family history of kidney stones (V18.69) : Sister  Social History Problems    Alcohol use (V49.89)   Caffeine use (V49.89)   Married   Never smoker   Number of children   Occupation  Review of Systems Genitourinary, constitutional, skin, eye, otolaryngeal, hematologic/lymphatic, cardiovascular, pulmonary, endocrine, musculoskeletal, gastrointestinal, neurological and psychiatric system(s) were reviewed and pertinent findings if present are noted.  Genitourinary: hematuria.  Gastrointestinal: abdominal pain.    Vitals Vital Signs [Data Includes: Last 1 Day]  Recorded: [id number] 02:00PM  Blood Pressure: 147 / 95 Temperature: 97.6 F Heart Rate: 99 Recorded: [id number] 01:52PM  Height: 5 ft 10 in Weight: 236 lb  BMI Calculated: 33.86 BSA Calculated: 2.24  Physical  Exam Constitutional: Well nourished and well developed . No acute distress.  ENT:. The ears and nose are normal in appearance.  Neck: The appearance of the neck is normal and no neck mass is present.  Pulmonary: No respiratory distress and normal respiratory rhythm and effort.  Cardiovascular: Heart rate and rhythm are normal . No peripheral edema.  Abdomen: The abdomen is mildly obese. The abdomen is soft and nontender. No masses are palpated. No CVA tenderness. No hernias are palpable. No hepatosplenomegaly noted.  Lymphatics: The posterior cervical and supraclavicular nodes are not enlarged or tender.  Skin: Normal skin turgor, no visible rash and no visible skin lesions.  Neuro/Psych:. Mood and affect are appropriate.    Results/Data  The following images/tracing/specimen were independently visualized:  KUB and CT films and report reviewed. I can see the stone on the KUB but it is faint. On CT it is up to 1000HU.  The following clinical lab reports were reviewed:  I reviewed his UA from the hospital with TNTC RBC.    Assessment Assessed   1. Calculus of kidney (592.0)   He has a 12x35mm LUPJ stone with an episode of hematuria and pain.   Plan Calculus of kidney   1. Start: Oxycodone-Acetaminophen 5-325 MG Oral Tablet; take 1 or 2 tablets q 4-6 hours  prn pain  2. Start: Tamsulosin HCl - 0.4 MG Oral Capsule; TAKE ONE CAPSULE BY MOUTH AT  BEDTIME  3. Follow-up Schedule Surgery Office  Follow-up  Status: Hold  For - Appointment   Requested for: [phone]   I discussed the treatment options in detail including ESWL, Ureteroscopy and PCNL.  I think he will be best served by initial ESWL and reviewed the risks of bleeding, infection, failure of fragmentation, need for secondary procedures, thrombotic events and sedation risks as well as renal or adjacent organ injury that could be serious or fatal.     He would like to proceed.   I have given him a script for percocet and sent in  tamsulosin and reviewed the instructions and side effects.   Discussion/Summary  CC: Dr. Ivar Bury.

## 2014-03-25 ENCOUNTER — Encounter (HOSPITAL_COMMUNITY): Payer: Self-pay | Admitting: *Deleted

## 2014-03-25 ENCOUNTER — Ambulatory Visit (HOSPITAL_COMMUNITY): Payer: Managed Care, Other (non HMO)

## 2014-03-25 ENCOUNTER — Encounter (HOSPITAL_COMMUNITY)
Admission: RE | Disposition: A | Discharge status: Home / Self Care | Payer: Self-pay | Source: Ambulatory Visit | Attending: Urology

## 2014-03-25 ENCOUNTER — Ambulatory Visit (HOSPITAL_COMMUNITY)
Admission: RE | Admit: 2014-03-25 | Discharge: 2014-03-25 | Disposition: A | Discharge status: Home / Self Care | Payer: Managed Care, Other (non HMO) | Source: Ambulatory Visit | Attending: Urology | Admitting: Urology

## 2014-03-25 DIAGNOSIS — N200 Calculus of kidney: Secondary | ICD-10-CM | POA: Insufficient documentation

## 2014-03-25 DIAGNOSIS — R319 Hematuria, unspecified: Secondary | ICD-10-CM | POA: Insufficient documentation

## 2014-03-25 DIAGNOSIS — Z79899 Other long term (current) drug therapy: Secondary | ICD-10-CM | POA: Insufficient documentation

## 2014-03-25 DIAGNOSIS — Z7982 Long term (current) use of aspirin: Secondary | ICD-10-CM | POA: Insufficient documentation

## 2014-03-25 DIAGNOSIS — K219 Gastro-esophageal reflux disease without esophagitis: Secondary | ICD-10-CM | POA: Insufficient documentation

## 2014-03-25 SURGERY — LITHOTRIPSY, ESWL
Anesthesia: LOCAL | Laterality: Left

## 2014-03-25 MED ORDER — DIPHENHYDRAMINE HCL 25 MG PO CAPS
25.0000 mg | ORAL_CAPSULE | ORAL | Status: AC
  Administered 2014-03-25: 25 mg via ORAL
  Filled 2014-03-25: qty 1

## 2014-03-25 MED ORDER — CIPROFLOXACIN HCL 500 MG PO TABS
500.0000 mg | ORAL_TABLET | ORAL | Status: AC
  Administered 2014-03-25: 500 mg via ORAL
  Filled 2014-03-25: qty 1

## 2014-03-25 MED ORDER — DIAZEPAM 5 MG PO TABS
10.0000 mg | ORAL_TABLET | ORAL | Status: AC
Start: 2014-03-25 — End: 2014-03-25
  Administered 2014-03-25: 10 mg via ORAL
  Filled 2014-03-25: qty 2

## 2014-03-25 MED ORDER — DEXTROSE-NACL 5-0.45 % IV SOLN
INTRAVENOUS | Status: DC
  Administered 2014-03-25: 08:00:00 via INTRAVENOUS

## 2014-03-25 NOTE — Discharge Instructions (Signed)
Lithotripsy, Care After °Refer to this sheet in the next few weeks. These instructions provide you with information on caring for yourself after your procedure. Your health care provider may also give you more specific instructions. Your treatment has been planned according to current medical practices, but problems sometimes occur. Call your health care provider if you have any problems or questions after your procedure. °WHAT TO EXPECT AFTER THE PROCEDURE  °· Your urine may have a red tinge for a few days after treatment. Blood loss is usually minimal. °· You may have soreness in the back or flank area. This usually goes away after a few days. The procedure can cause blotches or bruises on the back where the pressure wave enters the skin. These marks usually cause only minimal discomfort and should disappear in a short time. °· Stone fragments should begin to pass within 24 hours of treatment. However, a delayed passage is not unusual. °· You may have pain, discomfort, and feel sick to your stomach (nauseated) when the crushed fragments of stone are passed down the tube from the kidney to the bladder. Stone fragments can pass soon after the procedure and may last for up to 4 8 weeks. °· A small number of patients may have severe pain when stone fragments are not able to pass, which leads to an obstruction. °· If your stone is greater than 1 inch (2.5 cm) in diameter or if you have multiple stones that have a combined diameter greater than 1 inch (2.5 cm), you may require more than one treatment. °· If you had a stent placed prior to your procedure, you may experience some discomfort, especially during urination. You may experience the pain or discomfort in your flank or back, or you may experience a sharp pain or discomfort at the base of your penis or in your lower abdomen. The discomfort usually lasts only a few minutes after urinating. °HOME CARE INSTRUCTIONS  °· Rest at home until you feel your energy  improving. °· Only take over-the-counter or prescription medicines for pain, discomfort, or fever as directed by your health care provider. Depending on the type of lithotripsy, you may need to take antibiotics and anti-inflammatory medicines for a few days. °· Drink enough water and fluids to keep your urine clear or pale yellow. This helps "flush" your kidneys. It helps pass any remaining pieces of stone and prevents stones from coming back. °· Most people can resume daily activities within 1 2 days after standard lithotripsy. It can take longer to recover from laser and percutaneous lithotripsy. °· If the stones are in your urinary system, you may be asked to strain your urine at home to look for stones. Any stones that are found can be sent to a medical lab for examination. °· Visit your health care provider for a follow-up appointment in a few weeks. Your doctor may remove your stent if you have one. Your health care provider will also check to see whether stone particles still remain. °SEEK MEDICAL CARE IF:  °· Your pain is not relieved by medicine. °· You have a lasting nauseous feeling. °· You feel there is too much blood in the urine. °· You develop persistent problems with frequent or painful urination that does not at least partially improve after 2 days following the procedure. °· You have a congested cough. °· You feel lightheaded. °· You develop a rash or any other signs that might suggest an allergic problem. °· You develop any reaction or side   effects to your medicine(s). °SEEK IMMEDIATE MEDICAL CARE IF:  °· You experience severe back or flank pain or both. °· You see nothing but blood when you urinate. °· You cannot pass any urine at all. °· You have a fever or shaking chills. °· You develop shortness of breath, difficulty breathing, or chest pain. °· You develop vomiting that will not stop after 6 8 hours. °· You have a fainting episode. °Document Released: 12/30/2007 Document Revised: 09/30/2013  Document Reviewed: 06/25/2013 °[employer]® Patient Information ©2014 [employer]. ° °

## 2014-03-25 NOTE — Interval H&P Note (Signed)
History and Physical Interval Note  The stone is readily seen on KUB today.  03/25/2014 8:49 AM  Randall Turner  has presented today for surgery, with the diagnosis of LEFT RENAL STONE  The various methods of treatment have been discussed with the patient and family. After consideration of risks, benefits and other options for treatment, the patient has consented to  Procedure(s): LEFT EXTRACORPOREAL SHOCK WAVE LITHOTRIPSY (ESWL) (Left) as a surgical intervention .  The patient's history has been reviewed, patient examined, no change in status, stable for surgery.  I have reviewed the patient's chart and labs.  Questions were answered to the patient's satisfaction.     [name]

## 2014-08-03 ENCOUNTER — Encounter: Payer: Self-pay | Admitting: Emergency Medicine

## 2014-08-03 ENCOUNTER — Other Ambulatory Visit: Payer: Self-pay | Admitting: Emergency Medicine

## 2014-08-03 ENCOUNTER — Ambulatory Visit (INDEPENDENT_AMBULATORY_CARE_PROVIDER_SITE_OTHER): Payer: Managed Care, Other (non HMO) | Admitting: Emergency Medicine

## 2014-08-03 VITALS — BP 120/84 | HR 71 | Temp 98.3°F | Resp 16 | Ht 70.5 in | Wt 234.0 lb

## 2014-08-03 DIAGNOSIS — Z125 Encounter for screening for malignant neoplasm of prostate: Secondary | ICD-10-CM

## 2014-08-03 DIAGNOSIS — Z0000 Encounter for general adult medical examination without abnormal findings: Secondary | ICD-10-CM

## 2014-08-03 DIAGNOSIS — E785 Hyperlipidemia, unspecified: Secondary | ICD-10-CM

## 2014-08-03 DIAGNOSIS — N200 Calculus of kidney: Secondary | ICD-10-CM

## 2014-08-03 DIAGNOSIS — Z23 Encounter for immunization: Secondary | ICD-10-CM

## 2014-08-03 DIAGNOSIS — R7309 Other abnormal glucose: Secondary | ICD-10-CM

## 2014-08-03 DIAGNOSIS — R739 Hyperglycemia, unspecified: Secondary | ICD-10-CM

## 2014-08-03 DIAGNOSIS — Z1329 Encounter for screening for other suspected endocrine disorder: Secondary | ICD-10-CM

## 2014-08-03 LAB — COMPLETE METABOLIC PANEL WITH GFR
ALT: 55 U/L — ABNORMAL HIGH (ref 0–53)
AST: 31 U/L (ref 0–37)
Albumin: 4.6 g/dL (ref 3.5–5.2)
Alkaline Phosphatase: 92 U/L (ref 39–117)
BUN: 19 mg/dL (ref 6–23)
CO2: 23 mEq/L (ref 19–32)
Calcium: 9.2 mg/dL (ref 8.4–10.5)
Chloride: 104 mEq/L (ref 96–112)
Creat: 1.17 mg/dL (ref 0.50–1.35)
GFR, Est African American: 77 mL/min
GFR, Est Non African American: 67 mL/min
Glucose, Bld: 108 mg/dL — ABNORMAL HIGH (ref 70–99)
Potassium: 4.2 mEq/L (ref 3.5–5.3)
Sodium: 137 mEq/L (ref 135–145)
Total Bilirubin: 0.9 mg/dL (ref 0.2–1.2)
Total Protein: 6.9 g/dL (ref 6.0–8.3)

## 2014-08-03 LAB — POCT URINALYSIS DIPSTICK
Bilirubin, UA: NEGATIVE
Blood, UA: NEGATIVE
Glucose, UA: NEGATIVE
Ketones, UA: NEGATIVE
Leukocytes, UA: NEGATIVE
Nitrite, UA: NEGATIVE
Protein, UA: NEGATIVE
Spec Grav, UA: <=1.005
Urobilinogen, UA: 0.2
pH, UA: 5.5

## 2014-08-03 LAB — CBC WITH DIFFERENTIAL/PLATELET
Basophils Absolute: 0 10*3/uL (ref 0.0–0.1)
Basophils Relative: 1 % (ref 0–1)
Eosinophils Absolute: 0.2 10*3/uL (ref 0.0–0.7)
Eosinophils Relative: 4 % (ref 0–5)
HCT: 46.5 % (ref 39.0–52.0)
Hemoglobin: 16.5 g/dL (ref 13.0–17.0)
Lymphocytes Relative: 25 % (ref 12–46)
Lymphs Abs: 1.2 10*3/uL (ref 0.7–4.0)
MCH: 30.8 pg (ref 26.0–34.0)
MCHC: 35.5 g/dL (ref 30.0–36.0)
MCV: 86.9 fL (ref 78.0–100.0)
Monocytes Absolute: 0.5 10*3/uL (ref 0.1–1.0)
Monocytes Relative: 10 % (ref 3–12)
Neutro Abs: 2.8 10*3/uL (ref 1.7–7.7)
Neutrophils Relative %: 60 % (ref 43–77)
Platelets: 211 10*3/uL (ref 150–400)
RBC: 5.35 MIL/uL (ref 4.22–5.81)
RDW: 13.6 % (ref 11.5–15.5)
WBC: 4.6 10*3/uL (ref 4.0–10.5)

## 2014-08-03 LAB — POCT GLYCOSYLATED HEMOGLOBIN (HGB A1C): Hemoglobin A1C: 5.2

## 2014-08-03 LAB — IFOBT (OCCULT BLOOD): IFOBT: NEGATIVE

## 2014-08-03 LAB — LIPID PANEL
Cholesterol: 185 mg/dL (ref 0–200)
HDL: 56 mg/dL (ref >39)
LDL Cholesterol: 101 mg/dL — ABNORMAL HIGH (ref 0–99)
Total CHOL/HDL Ratio: 3.3 Ratio
Triglycerides: 142 mg/dL (ref <150)
VLDL: 28 mg/dL (ref 0–40)

## 2014-08-03 LAB — TSH: TSH: 2.877 u[IU]/mL (ref 0.350–4.500)

## 2014-08-03 NOTE — Progress Notes (Signed)
   Subjective:    Patient ID: Randall Turner, male    DOB: 1951/01/13, 63 y.o.   MRN: [id number]  HPI    Review of Systems  Constitutional: Negative.   HENT: Negative.   Eyes: Negative.   Respiratory: Negative.   Cardiovascular: Negative.   Gastrointestinal: Negative.   Endocrine: Negative.   Genitourinary: Negative.   Musculoskeletal: Negative.   Skin: Negative.   Allergic/Immunologic: Negative.   Neurological: Negative.   Hematological: Negative.   Psychiatric/Behavioral: Negative.        Objective:   Physical Exam  Constitutional: He is oriented to person, place, and time. He appears well-developed and well-nourished.  HENT:  Head: Normocephalic and atraumatic.  Eyes: Pupils are equal, round, and reactive to light.  Neck: Normal range of motion. No tracheal deviation present. No thyromegaly present.  Cardiovascular: Normal rate, regular rhythm and normal heart sounds.   Pulmonary/Chest: Breath sounds normal. No respiratory distress. He has no wheezes. He has no rales. He exhibits no tenderness.  Abdominal: Soft. Bowel sounds are normal. He exhibits no distension and no mass. There is no tenderness. There is no rebound and no guarding.  There is an easily reducible umbilical  Genitourinary: Prostate normal and penis normal.  Musculoskeletal: Normal range of motion. He exhibits no edema and no tenderness.  Neurological: He is alert and oriented to person, place, and time. He has normal reflexes.  Skin: Skin is warm and dry.  Psychiatric: He has a normal mood and affect. His behavior is normal. Thought content normal.  EKG normal sinus rhythm Results for orders placed in visit on 08/03/14  IFOBT (OCCULT BLOOD)      Result Value Ref Range   IFOBT Negative           Assessment & Plan:  Patient looks great. He is up-to-date on colonoscopies immunizations and is doing well status post lithotripsy. Recheck one year no changes. He was encouraged to exercise more.

## 2014-08-03 NOTE — Progress Notes (Signed)
   Subjective:    Patient ID: Randall Turner, male    DOB: 1951-01-12, 63 y.o.   MRN: [id number] This chart was scribed for Darlyne Russian, MD by Cathie Hoops, ED Scribe. The patient was seen in Room 21. The patient's care was started at 8:19 AM.   HPI HPI Comments: Randall Turner is a 63 y.o. male who presents to the [facility] a complete physical exam. History of kidney stones doing well. Status: Post-lithotripsy.   UTD on colonoscopy and immunizations.    Review of Systems  Constitutional: Negative for fever and chills.  HENT: Negative for drooling.   Eyes: Negative for discharge.  Respiratory: Negative for shortness of breath.   Gastrointestinal: Negative for nausea and vomiting.  Psychiatric/Behavioral: Negative for confusion.    Past Medical History  Diagnosis Date  . Allergy   . GERD (gastroesophageal reflux disease)   . Asthma     childhood asthma  . Kidney stone    Past Surgical History  Procedure Laterality Date  . Vasectomy    . Patella removed right knee    . Bunionectomy    . Tonsillectomy     Allergies  Allergen Reactions  . Cortisone     swelling   Current Outpatient Prescriptions  Medication Sig Dispense Refill  . aspirin EC 81 MG tablet Take 162 mg by mouth daily.      Marland Kitchen omeprazole (PRILOSEC) 20 MG capsule Take 20 mg by mouth daily.      Marland Kitchen oxyCODONE-acetaminophen (PERCOCET/ROXICET) 5-325 MG per tablet Take 1-2 tablets by mouth every 6 (six) hours as needed for severe pain.      . tamsulosin (FLOMAX) 0.4 MG CAPS capsule Take 0.4 mg by mouth at bedtime as needed.       No current facility-administered medications for this visit.       Objective:  Triage Vitals: BP 120/84  Pulse 71  Temp(Src) 98.3 F (36.8 C) (Oral)  Resp 16  Ht 5' 10.5" (1.791 m)  Wt 234 lb (106.142 kg)  BMI 33.09 kg/m2  SpO2 96% Physical Exam CONSTITUTIONAL: Well developed/well nourished HEAD: Normocephalic/atraumatic EYES: EOMI/PERRL ENMT:  Mucous membranes moist NECK: supple no meningeal signs SPINE:entire spine nontender CV: S1/S2 noted, no murmurs/rubs/gallops noted LUNGS: Lungs are clear to auscultation bilaterally, no apparent distress ABDOMEN: soft, nontender, no rebound or guarding GU:no cva tenderness NEURO: Pt is awake/alert, moves all extremitiesx4 EXTREMITIES: pulses normal, full ROM SKIN: warm, color normal PSYCH: no abnormalities of mood noted   Assessment & Plan:  8:28 AM- Patient informed of current plan for treatment and evaluation and agrees with plan at this time.  Advised to work on weight reduction.

## 2014-08-03 NOTE — Addendum Note (Signed)
Addended by: Lanna Poche R on: 08/03/2014 01:43 PM   Modules accepted: Orders

## 2014-08-04 LAB — HEPATITIS C ANTIBODY: HCV Ab: NEGATIVE

## 2014-08-04 LAB — PSA: PSA: 1.69 ng/mL (ref <=4.00)

## 2014-09-01 ENCOUNTER — Other Ambulatory Visit: Payer: Self-pay | Admitting: Emergency Medicine

## 2014-09-02 NOTE — Telephone Encounter (Signed)
Dr [name] I don't see GERD addressed at CPE last month. OK to give RFs?

## 2015-04-08 ENCOUNTER — Telehealth: Payer: Self-pay | Admitting: Family Medicine

## 2015-04-08 NOTE — Telephone Encounter (Signed)
lmom to call and reschedule his appt with Dr [name] his clinic on 08/09/15 Will be closed

## 2015-08-09 ENCOUNTER — Encounter: Payer: Managed Care, Other (non HMO) | Admitting: Emergency Medicine

## 2015-08-30 ENCOUNTER — Other Ambulatory Visit: Payer: Self-pay | Admitting: Emergency Medicine

## 2015-09-15 ENCOUNTER — Ambulatory Visit (INDEPENDENT_AMBULATORY_CARE_PROVIDER_SITE_OTHER): Payer: 59 | Admitting: Emergency Medicine

## 2015-09-15 ENCOUNTER — Encounter: Payer: Self-pay | Admitting: Emergency Medicine

## 2015-09-15 ENCOUNTER — Ambulatory Visit (INDEPENDENT_AMBULATORY_CARE_PROVIDER_SITE_OTHER): Payer: 59

## 2015-09-15 VITALS — BP 160/120 | HR 88 | Temp 98.4°F | Resp 16 | Ht 70.5 in | Wt 241.6 lb

## 2015-09-15 DIAGNOSIS — Z23 Encounter for immunization: Secondary | ICD-10-CM

## 2015-09-15 DIAGNOSIS — R739 Hyperglycemia, unspecified: Secondary | ICD-10-CM | POA: Diagnosis not present

## 2015-09-15 DIAGNOSIS — N200 Calculus of kidney: Secondary | ICD-10-CM | POA: Diagnosis not present

## 2015-09-15 DIAGNOSIS — K219 Gastro-esophageal reflux disease without esophagitis: Secondary | ICD-10-CM

## 2015-09-15 DIAGNOSIS — I10 Essential (primary) hypertension: Secondary | ICD-10-CM | POA: Diagnosis not present

## 2015-09-15 DIAGNOSIS — Z0000 Encounter for general adult medical examination without abnormal findings: Secondary | ICD-10-CM | POA: Diagnosis not present

## 2015-09-15 DIAGNOSIS — M25511 Pain in right shoulder: Secondary | ICD-10-CM

## 2015-09-15 DIAGNOSIS — Z125 Encounter for screening for malignant neoplasm of prostate: Secondary | ICD-10-CM

## 2015-09-15 LAB — CBC WITH DIFFERENTIAL/PLATELET
Basophils Absolute: 0.1 10*3/uL (ref 0.0–0.1)
Basophils Relative: 1 % (ref 0–1)
Eosinophils Absolute: 0.3 10*3/uL (ref 0.0–0.7)
Eosinophils Relative: 4 % (ref 0–5)
HCT: 46.5 % (ref 39.0–52.0)
Hemoglobin: 17.6 g/dL — ABNORMAL HIGH (ref 13.0–17.0)
Lymphocytes Relative: 29 % (ref 12–46)
Lymphs Abs: 1.9 10*3/uL (ref 0.7–4.0)
MCH: 32.7 pg (ref 26.0–34.0)
MCHC: 36.4 g/dL — ABNORMAL HIGH (ref 30.0–36.0)
MCV: 86.4 fL (ref 78.0–100.0)
MPV: 10.2 fL (ref 8.6–12.4)
Monocytes Absolute: 0.7 10*3/uL (ref 0.1–1.0)
Monocytes Relative: 11 % (ref 3–12)
Neutro Abs: 3.6 10*3/uL (ref 1.7–7.7)
Neutrophils Relative %: 55 % (ref 43–77)
Platelets: 221 10*3/uL (ref 150–400)
RBC: 5.38 MIL/uL (ref 4.22–5.81)
RDW: 13 % (ref 11.5–15.5)
WBC: 6.5 10*3/uL (ref 4.0–10.5)

## 2015-09-15 LAB — COMPLETE METABOLIC PANEL WITH GFR
ALT: 99 U/L — ABNORMAL HIGH (ref 9–46)
AST: 52 U/L — ABNORMAL HIGH (ref 10–35)
Albumin: 4.7 g/dL (ref 3.6–5.1)
Alkaline Phosphatase: 104 U/L (ref 40–115)
BUN: 18 mg/dL (ref 7–25)
CO2: 24 mmol/L (ref 20–31)
Calcium: 9.5 mg/dL (ref 8.6–10.3)
Chloride: 102 mmol/L (ref 98–110)
Creat: 1.23 mg/dL (ref 0.70–1.25)
GFR, Est African American: 72 mL/min (ref >=60)
GFR, Est Non African American: 63 mL/min (ref >=60)
Glucose, Bld: 109 mg/dL — ABNORMAL HIGH (ref 65–99)
Potassium: 4.4 mmol/L (ref 3.5–5.3)
Sodium: 138 mmol/L (ref 135–146)
Total Bilirubin: 1.2 mg/dL (ref 0.2–1.2)
Total Protein: 6.8 g/dL (ref 6.1–8.1)

## 2015-09-15 LAB — POCT URINALYSIS DIP (MANUAL ENTRY)
Bilirubin, UA: NEGATIVE
Blood, UA: NEGATIVE
Glucose, UA: NEGATIVE
Ketones, POC UA: NEGATIVE
Leukocytes, UA: NEGATIVE
Nitrite, UA: NEGATIVE
Protein Ur, POC: NEGATIVE
Spec Grav, UA: 1.01
Urobilinogen, UA: 0.2
pH, UA: 5.5

## 2015-09-15 LAB — LIPID PANEL
Cholesterol: 207 mg/dL — ABNORMAL HIGH (ref 125–200)
HDL: 50 mg/dL (ref >=40)
LDL Cholesterol: 104 mg/dL (ref <130)
Total CHOL/HDL Ratio: 4.1 Ratio (ref <=5.0)
Triglycerides: 264 mg/dL — ABNORMAL HIGH (ref <150)
VLDL: 53 mg/dL — ABNORMAL HIGH (ref <30)

## 2015-09-15 LAB — POC MICROSCOPIC URINALYSIS (UMFC): Mucus: ABSENT

## 2015-09-15 LAB — POCT GLYCOSYLATED HEMOGLOBIN (HGB A1C): Hemoglobin A1C: 5.2

## 2015-09-15 LAB — GLUCOSE, POCT (MANUAL RESULT ENTRY): POC Glucose: 93 mg/dl (ref 70–99)

## 2015-09-15 LAB — TSH: TSH: 2.754 u[IU]/mL (ref 0.350–4.500)

## 2015-09-15 MED ORDER — LOSARTAN POTASSIUM 50 MG PO TABS: 50.0000 mg | ORAL_TABLET | Freq: Every day | ORAL | Status: DC

## 2015-09-15 MED ORDER — OMEPRAZOLE 20 MG PO CPDR: DELAYED_RELEASE_CAPSULE | ORAL | Status: DC

## 2015-09-15 NOTE — Progress Notes (Deleted)
   Subjective:    Patient ID: Randall Turner, male    DOB: [birth year], 64 y.o.   MRN: [id number]  HPI    Review of Systems  Constitutional: Negative.   HENT: Negative.   Eyes: Negative.   Respiratory: Negative.   Cardiovascular: Negative.   Gastrointestinal: Negative.   Endocrine: Negative.   Genitourinary: Negative.   Musculoskeletal: Positive for arthralgias.  Allergic/Immunologic: Negative.   Neurological: Negative.   Hematological: Negative.   Psychiatric/Behavioral: Negative.        Objective:   Physical Exam        Assessment & Plan:

## 2015-09-15 NOTE — Progress Notes (Addendum)
This chart was scribed for Nena Jordan, MD by [facility], medical scribe at [facility].The patient was seen in exam room 22 and the patient's care was started at 9:38 AM.  Chief Complaint:  Chief Complaint  Patient presents with  . Annual Exam   HPI: Randall Turner is a 64 y.o. male who reports to [facility] today for an annual physical exam. BP elevated today 160/120 on recheck. Third recheck is 130/100. Not taking medication for HTN. Some life stressor could contribute to his elevated BP. BP at last visit was 120/84.  Weight is up from last visit.  Not taking flomax.  Fell at work and injured his right shoulder in January, this has lingered but improving. Took naproxen for relief. No x-ray.  Past Medical History  Diagnosis Date  . Allergy   . GERD (gastroesophageal reflux disease)   . Asthma     childhood asthma  . Kidney stone    Past Surgical History  Procedure Laterality Date  . Vasectomy    . Patella removed right knee    . Bunionectomy    . Tonsillectomy     Social History   Social History  . Marital Status: Married    Spouse Name: N/A  . Number of Children: N/A  . Years of Education: N/A   Social History Main Topics  . Smoking status: Never Smoker   . Smokeless tobacco: Never Used  . Alcohol Use: Yes     Comment: ocas 2 drinks  . Drug Use: No  . Sexual Activity: Not Asked   Other Topics Concern  . None   Social History Narrative   Married. Education: [employer].    Family History  Problem Relation Age of Onset  . Cancer Mother   . Gallstones Mother   . Cancer Father   . Colon cancer Neg Hx   . Pancreatic cancer Neg Hx   . Stomach cancer Neg Hx   . Heart disease Maternal Grandmother   . Cancer Maternal Grandfather   . Heart disease Paternal Grandmother    Allergies  Allergen Reactions  . Cortisone     swelling   Prior to Admission medications   Medication Sig Start Date End Date Taking? Authorizing Provider  aspirin EC 81  MG tablet Take 162 mg by mouth daily.   Yes Historical Provider, MD  omeprazole (PRILOSEC) 20 MG capsule TAKE ONE CAPSULE BY MOUTH ONCE DAILY 08/30/15  Yes Chelle Jeffery, PA-C   ROS: The patient denies fevers, chills, night sweats, unintentional weight loss, chest pain, palpitations, wheezing, dyspnea on exertion, nausea, vomiting, abdominal pain, dysuria, hematuria, melena, numbness, weakness, or tingling.  All other systems have been reviewed and were otherwise negative with the exception of those mentioned in the HPI and as above.    PHYSICAL EXAM: Filed Vitals:   09/15/15 0939  BP: 160/120  Pulse:   Temp:   Resp:    Body mass index is 34.16 kg/(m^2).  General: Alert, no acute distress HEENT:  Normocephalic, atraumatic, oropharynx patent. Eye: [name] Cardiovascular:  Regular rate and rhythm, no rubs murmurs or gallops.  No Carotid bruits, radial pulse intact. No pedal edema.  Respiratory: Clear to auscultation bilaterally.  No wheezes, rales, or rhonchi.  No cyanosis, no use of accessory musculature Abdominal: No organomegaly, abdomen is soft and non-tender, positive bowel sounds.  No masses. Musculoskeletal: Gait intact. No edema, tenderness Skin: No rashes. Neurologic: Facial musculature symmetric. Psychiatric: Patient acts appropriately throughout  our interaction. Lymphatic: No cervical or submandibular lymphadenopathy Genitourinary/Anorectal: No acute findings prostate is normal size there are no rectal masses  LABS: Results for orders placed or performed in visit on 09/15/15  POCT urinalysis dipstick  Result Value Ref Range   Color, UA yellow yellow   Clarity, UA clear clear   Glucose, UA negative negative   Bilirubin, UA negative negative   Ketones, POC UA negative negative   Spec Grav, UA 1.010    Blood, UA negative negative   pH, UA 5.5    Protein Ur, POC negative negative   Urobilinogen, UA 0.2    Nitrite, UA Negative Negative   Leukocytes, UA Negative  Negative  POCT Microscopic Urinalysis ([facility])  Result Value Ref Range   WBC,UR,HPF,POC None None WBC/hpf   RBC,UR,HPF,POC None None RBC/hpf   Bacteria None None   Mucus Absent Absent   Epithelial Cells, UR Per Microscopy  None cells/hpf  POCT glucose (manual entry)  Result Value Ref Range   POC Glucose 93 70 - 99 mg/dl  POCT glycosylated hemoglobin (Hb A1C)  Result Value Ref Range   Hemoglobin A1C 5.2    EKG/XRAY:   Primary read interpreted by [name] at [facility]. There is before meals joint arthritis otherwise unremarkable.  ASSESSMENT/PLAN: He has been under a lot stress with his wife's breast cancer. I encouraged him to start working on weight loss diet and exercise. I started him on losartan 50 mg daily. Recheck blood pressure in about a month. Gross sideeffects, risk and benefits, and alternatives of medications d/w patient. Patient is aware that all medications have potential sideeffects and we are unable to predict every sideeffect or drug-drug interaction that may occur.  By signing my name below, I, Nadim Abuhashem, attest that this documentation has been prepared under the direction and in the presence of Nena Jordan, MD.  Electronically Signed: Lora Havens, medical scribe. 09/15/2015 9:48 AM.  Arlyss Queen MD 09/15/2015 9:48 AM

## 2015-09-15 NOTE — Patient Instructions (Signed)
Hypertension Hypertension, commonly called high blood pressure, is when the force of blood pumping through your arteries is too strong. Your arteries are the blood vessels that carry blood from your heart throughout your body. A blood pressure reading consists of a higher number over a lower number, such as 110/72. The higher number (systolic) is the pressure inside your arteries when your heart pumps. The lower number (diastolic) is the pressure inside your arteries when your heart relaxes. Ideally you want your blood pressure below 120/80. Hypertension forces your heart to work harder to pump blood. Your arteries may become narrow or stiff. Having hypertension puts you at risk for heart disease, stroke, and other problems.  RISK FACTORS Some risk factors for high blood pressure are controllable. Others are not.  Risk factors you cannot control include:   Race. You may be at higher risk if you are African American.  Age. Risk increases with age.  Gender. Men are at higher risk than women before age 45 years. After age 65, women are at higher risk than men. Risk factors you can control include:  Not getting enough exercise or physical activity.  Being overweight.  Getting too much fat, sugar, calories, or salt in your diet.  Drinking too much alcohol. SIGNS AND SYMPTOMS Hypertension does not usually cause signs or symptoms. Extremely high blood pressure (hypertensive crisis) may cause headache, anxiety, shortness of breath, and nosebleed. DIAGNOSIS  To check if you have hypertension, your health care provider will measure your blood pressure while you are seated, with your arm held at the level of your heart. It should be measured at least twice using the same arm. Certain conditions can cause a difference in blood pressure between your right and left arms. A blood pressure reading that is higher than normal on one occasion does not mean that you need treatment. If one blood pressure reading  is high, ask your health care provider about having it checked again. TREATMENT  Treating high blood pressure includes making lifestyle changes and possibly taking medicine. Living a healthy lifestyle can help lower high blood pressure. You may need to change some of your habits. Lifestyle changes may include:  Following the DASH diet. This diet is high in fruits, vegetables, and whole grains. It is low in salt, red meat, and added sugars.  Getting at least 2 hours of brisk physical activity every week.  Losing weight if necessary.  Not smoking.  Limiting alcoholic beverages.  Learning ways to reduce stress. If lifestyle changes are not enough to get your blood pressure under control, your health care provider may prescribe medicine. You may need to take more than one. Work closely with your health care provider to understand the risks and benefits. HOME CARE INSTRUCTIONS  Have your blood pressure rechecked as directed by your health care provider.   Take medicines only as directed by your health care provider. Follow the directions carefully. Blood pressure medicines must be taken as prescribed. The medicine does not work as well when you skip doses. Skipping doses also puts you at risk for problems.   Do not smoke.   Monitor your blood pressure at home as directed by your health care provider. SEEK MEDICAL CARE IF:   You think you are having a reaction to medicines taken.  You have recurrent headaches or feel dizzy.  You have swelling in your ankles.  You have trouble with your vision. SEEK IMMEDIATE MEDICAL CARE IF:  You develop a severe headache or confusion.    You have unusual weakness, numbness, or feel faint.  You have severe chest or abdominal pain.  You vomit repeatedly.  You have trouble breathing. MAKE SURE YOU:   Understand these instructions.  Will watch your condition.  Will get help right away if you are not doing well or get worse. Document  Released: 12/10/2005 Document Revised: 04/26/2014 Document Reviewed: 10/02/2013 ExitCare Patient Information 2015 [employer]. This information is not intended to replace advice given to you by your health care provider. Make sure you discuss any questions you have with your health care provider. Health Maintenance A healthy lifestyle and preventative care can promote health and wellness.  Maintain regular health, dental, and eye exams.  Eat a healthy diet. Foods like vegetables, fruits, whole grains, low-fat dairy products, and lean protein foods contain the nutrients you need and are low in calories. Decrease your intake of foods high in solid fats, added sugars, and salt. Get information about a proper diet from your health care provider, if necessary.  Regular physical exercise is one of the most important things you can do for your health. Most adults should get at least 150 minutes of moderate-intensity exercise (any activity that increases your heart rate and causes you to sweat) each week. In addition, most adults need muscle-strengthening exercises on 2 or more days a week.   Maintain a healthy weight. The body mass index (BMI) is a screening tool to identify possible weight problems. It provides an estimate of body fat based on height and weight. Your health care provider can find your BMI and can help you achieve or maintain a healthy weight. For males 20 years and older:  A BMI below 18.5 is considered underweight.  A BMI of 18.5 to 24.9 is normal.  A BMI of 25 to 29.9 is considered overweight.  A BMI of 30 and above is considered obese.  Maintain normal blood lipids and cholesterol by exercising and minimizing your intake of saturated fat. Eat a balanced diet with plenty of fruits and vegetables. Blood tests for lipids and cholesterol should begin at age 20 and be repeated every 5 years. If your lipid or cholesterol levels are high, you are over age 50, or you are at high risk  for heart disease, you may need your cholesterol levels checked more frequently.Ongoing high lipid and cholesterol levels should be treated with medicines if diet and exercise are not working.  If you smoke, find out from your health care provider how to quit. If you do not use tobacco, do not start.  Lung cancer screening is recommended for adults aged 55-80 years who are at high risk for developing lung cancer because of a history of smoking. A yearly low-dose CT scan of the lungs is recommended for people who have at least a 30-pack-year history of smoking and are current smokers or have quit within the past 15 years. A pack year of smoking is smoking an average of 1 pack of cigarettes a day for 1 year (for example, a 30-pack-year history of smoking could mean smoking 1 pack a day for 30 years or 2 packs a day for 15 years). Yearly screening should continue until the smoker has stopped smoking for at least 15 years. Yearly screening should be stopped for people who develop a health problem that would prevent them from having lung cancer treatment.  If you choose to drink alcohol, do not have more than 2 drinks per day. One drink is considered to be 12 oz (  360 mL) of beer, 5 oz (150 mL) of wine, or 1.5 oz (45 mL) of liquor.  Avoid the use of street drugs. Do not share needles with anyone. Ask for help if you need support or instructions about stopping the use of drugs.  High blood pressure causes heart disease and increases the risk of stroke. Blood pressure should be checked at least every 1-2 years. Ongoing high blood pressure should be treated with medicines if weight loss and exercise are not effective.  If you are 45-79 years old, ask your health care provider if you should take aspirin to prevent heart disease.  Diabetes screening involves taking a blood sample to check your fasting blood sugar level. This should be done once every 3 years after age 45 if you are at a normal weight and without  risk factors for diabetes. Testing should be considered at a younger age or be carried out more frequently if you are overweight and have at least 1 risk factor for diabetes.  Colorectal cancer can be detected and often prevented. Most routine colorectal cancer screening begins at the age of 50 and continues through age 75. However, your health care provider may recommend screening at an earlier age if you have risk factors for colon cancer. On a yearly basis, your health care provider may provide home test kits to check for hidden blood in the stool. A small camera at the end of a tube may be used to directly examine the colon (sigmoidoscopy or colonoscopy) to detect the earliest forms of colorectal cancer. Talk to your health care provider about this at age 50 when routine screening begins. A direct exam of the colon should be repeated every 5-10 years through age 75, unless early forms of precancerous polyps or small growths are found.  People who are at an increased risk for hepatitis B should be screened for this virus. You are considered at high risk for hepatitis B if:  You were born in a country where hepatitis B occurs often. Talk with your health care provider about which countries are considered high risk.  Your parents were born in a high-risk country and you have not received a shot to protect against hepatitis B (hepatitis B vaccine).  You have HIV or AIDS.  You use needles to inject street drugs.  You live with, or have sex with, someone who has hepatitis B.  You are a man who has sex with other men (MSM).  You get hemodialysis treatment.  You take certain medicines for conditions like cancer, organ transplantation, and autoimmune conditions.  Hepatitis C blood testing is recommended for all people born from 1945 through 1965 and any individual with known risk factors for hepatitis C.  Healthy men should no longer receive prostate-specific antigen (PSA) blood tests as part of  routine cancer screening. Talk to your health care provider about prostate cancer screening.  Testicular cancer screening is not recommended for adolescents or adult males who have no symptoms. Screening includes self-exam, a health care provider exam, and other screening tests. Consult with your health care provider about any symptoms you have or any concerns you have about testicular cancer.  Practice safe sex. Use condoms and avoid high-risk sexual practices to reduce the spread of sexually transmitted infections (STIs).  You should be screened for STIs, including gonorrhea and chlamydia if:  You are sexually active and are younger than 24 years.  You are older than 24 years, and your health care provider tells you   that you are at risk for this type of infection.  Your sexual activity has changed since you were last screened, and you are at an increased risk for chlamydia or gonorrhea. Ask your health care provider if you are at risk.  If you are at risk of being infected with HIV, it is recommended that you take a prescription medicine daily to prevent HIV infection. This is called pre-exposure prophylaxis (PrEP). You are considered at risk if:  You are a man who has sex with other men (MSM).  You are a heterosexual man who is sexually active with multiple partners.  You take drugs by injection.  You are sexually active with a partner who has HIV.  Talk with your health care provider about whether you are at high risk of being infected with HIV. If you choose to begin PrEP, you should first be tested for HIV. You should then be tested every 3 months for as long as you are taking PrEP.  Use sunscreen. Apply sunscreen liberally and repeatedly throughout the day. You should seek shade when your shadow is shorter than you. Protect yourself by wearing long sleeves, pants, a wide-brimmed hat, and sunglasses year round whenever you are outdoors.  Tell your health care provider of new moles  or changes in moles, especially if there is a change in shape or color. Also, tell your health care provider if a mole is larger than the size of a pencil eraser.  A one-time screening for abdominal aortic aneurysm (AAA) and surgical repair of large AAAs by ultrasound is recommended for men aged 65-75 years who are current or former smokers.  Stay current with your vaccines (immunizations). Document Released: 06/07/2008 Document Revised: 12/15/2013 Document Reviewed: 05/07/2011 ExitCare Patient Information 2015 [employer]. This information is not intended to replace advice given to you by your health care provider. Make sure you discuss any questions you have with your health care provider.  

## 2015-09-18 ENCOUNTER — Other Ambulatory Visit: Payer: Self-pay | Admitting: Radiology

## 2015-09-18 DIAGNOSIS — R7989 Other specified abnormal findings of blood chemistry: Secondary | ICD-10-CM

## 2015-09-18 DIAGNOSIS — R945 Abnormal results of liver function studies: Principal | ICD-10-CM

## 2015-09-18 DIAGNOSIS — D751 Secondary polycythemia: Secondary | ICD-10-CM

## 2015-09-19 ENCOUNTER — Telehealth: Payer: Self-pay | Admitting: *Deleted

## 2015-09-19 NOTE — Telephone Encounter (Signed)
Pt was called to and a message was left to call the office back regarding his elevated liver results and to be told of an ultrasound that has been placed by [name].

## 2015-09-19 NOTE — Telephone Encounter (Signed)
Spoke with pt, I gave him lab results and advised him why he needs an [place] of his liver/. Pt states he had an [place] in 2015 and it showed fatty liver. Do you still want him to repeat this [place]? Please advise. Pt has an appt Friday.

## 2015-09-19 NOTE — Telephone Encounter (Signed)
I would like to go ahead and repeat the ultrasound. His last one was done in January 2015 and as he stated did show fatty change.

## 2015-09-20 ENCOUNTER — Other Ambulatory Visit (INDEPENDENT_AMBULATORY_CARE_PROVIDER_SITE_OTHER): Payer: 59

## 2015-09-20 DIAGNOSIS — R7989 Other specified abnormal findings of blood chemistry: Secondary | ICD-10-CM

## 2015-09-20 DIAGNOSIS — R945 Abnormal results of liver function studies: Secondary | ICD-10-CM

## 2015-09-20 DIAGNOSIS — D751 Secondary polycythemia: Secondary | ICD-10-CM

## 2015-09-20 DIAGNOSIS — Z0000 Encounter for general adult medical examination without abnormal findings: Secondary | ICD-10-CM | POA: Diagnosis not present

## 2015-09-20 LAB — IRON AND TIBC
%SAT: 51 % (ref 15–60)
Iron: 169 ug/dL (ref 50–180)
TIBC: 333 ug/dL (ref 250–425)
UIBC: 164 ug/dL (ref 125–400)

## 2015-09-20 NOTE — Progress Notes (Signed)
Pt is here for lab work only. 

## 2015-09-20 NOTE — Telephone Encounter (Signed)
Spoke with pt, advised message from Dr. Daub. Pt understood. 

## 2015-09-21 ENCOUNTER — Encounter: Payer: Self-pay | Admitting: Family Medicine

## 2015-09-21 LAB — FERRITIN: Ferritin: 246 ng/mL (ref 22–322)

## 2015-09-21 LAB — HEPATITIS B SURFACE ANTIGEN: Hepatitis B Surface Ag: NEGATIVE

## 2015-09-21 LAB — MITOCHONDRIAL ANTIBODIES: Mitochondrial M2 Ab, IgG: 0.41 (ref <0.91)

## 2015-09-21 LAB — ANA: Anti Nuclear Antibody(ANA): NEGATIVE

## 2015-09-22 LAB — ALPHA-1-ANTITRYPSIN: A-1 Antitrypsin, Ser: 133 mg/dL (ref 83–199)

## 2015-09-22 LAB — CERULOPLASMIN: Ceruloplasmin: 20 mg/dL (ref 18–36)

## 2015-09-23 ENCOUNTER — Ambulatory Visit
Admission: RE | Admit: 2015-09-23 | Discharge: 2015-09-23 | Disposition: A | Discharge status: Home / Self Care | Payer: 59 | Source: Ambulatory Visit | Attending: Emergency Medicine | Admitting: Emergency Medicine

## 2015-09-23 DIAGNOSIS — R945 Abnormal results of liver function studies: Principal | ICD-10-CM

## 2015-09-23 DIAGNOSIS — D751 Secondary polycythemia: Secondary | ICD-10-CM

## 2015-09-23 DIAGNOSIS — R7989 Other specified abnormal findings of blood chemistry: Secondary | ICD-10-CM

## 2015-09-24 LAB — HEMOCHROMATOSIS DNA-PCR(C282Y,H63D)

## 2015-09-25 ENCOUNTER — Other Ambulatory Visit: Payer: Self-pay | Admitting: Emergency Medicine

## 2015-09-26 LAB — POC HEMOCCULT BLD/STL (HOME/3-CARD/SCREEN)
Card #2 Fecal Occult Blod, POC: NEGATIVE
Card #3 Fecal Occult Blood, POC: NEGATIVE
Fecal Occult Blood, POC: NEGATIVE

## 2015-09-27 ENCOUNTER — Encounter: Payer: Self-pay | Admitting: Emergency Medicine

## 2015-11-02 ENCOUNTER — Other Ambulatory Visit (HOSPITAL_BASED_OUTPATIENT_CLINIC_OR_DEPARTMENT_OTHER): Payer: 59

## 2015-11-02 ENCOUNTER — Ambulatory Visit (HOSPITAL_BASED_OUTPATIENT_CLINIC_OR_DEPARTMENT_OTHER): Payer: 59

## 2015-11-02 ENCOUNTER — Ambulatory Visit (HOSPITAL_BASED_OUTPATIENT_CLINIC_OR_DEPARTMENT_OTHER): Payer: 59 | Admitting: Hematology & Oncology

## 2015-11-02 ENCOUNTER — Ambulatory Visit: Payer: 59

## 2015-11-02 ENCOUNTER — Encounter: Payer: Self-pay | Admitting: Hematology & Oncology

## 2015-11-02 DIAGNOSIS — R7989 Other specified abnormal findings of blood chemistry: Secondary | ICD-10-CM

## 2015-11-02 LAB — CBC WITH DIFFERENTIAL (CANCER CENTER ONLY)
BASO#: 0.1 10*3/uL (ref 0.0–0.2)
BASO%: 0.8 % (ref 0.0–2.0)
EOS%: 3.8 % (ref 0.0–7.0)
Eosinophils Absolute: 0.3 10*3/uL (ref 0.0–0.5)
HCT: 44.8 % (ref 38.7–49.9)
HGB: 15.9 g/dL (ref 13.0–17.1)
LYMPH#: 2.2 10*3/uL (ref 0.9–3.3)
LYMPH%: 30.4 % (ref 14.0–48.0)
MCH: 31.2 pg (ref 28.0–33.4)
MCHC: 35.5 g/dL (ref 32.0–35.9)
MCV: 88 fL (ref 82–98)
MONO#: 0.9 10*3/uL (ref 0.1–0.9)
MONO%: 12 % (ref 0.0–13.0)
NEUT#: 3.8 10*3/uL (ref 1.5–6.5)
NEUT%: 53 % (ref 40.0–80.0)
Platelets: 228 10*3/uL (ref 145–400)
RBC: 5.1 10*6/uL (ref 4.20–5.70)
RDW: 13.2 % (ref 11.1–15.7)
WBC: 7.1 10*3/uL (ref 4.0–10.0)

## 2015-11-02 LAB — COMPREHENSIVE METABOLIC PANEL (CC13)
ALT: 132 U/L — ABNORMAL HIGH (ref 0–55)
AST: 67 U/L — ABNORMAL HIGH (ref 5–34)
Albumin: 4.1 g/dL (ref 3.5–5.0)
Alkaline Phosphatase: 132 U/L (ref 40–150)
Anion Gap: 10 mEq/L (ref 3–11)
BUN: 16.6 mg/dL (ref 7.0–26.0)
CO2: 22 mEq/L (ref 22–29)
Calcium: 9.6 mg/dL (ref 8.4–10.4)
Chloride: 107 mEq/L (ref 98–109)
Creatinine: 1 mg/dL (ref 0.7–1.3)
EGFR: 76 mL/min/{1.73_m2} — ABNORMAL LOW (ref >90)
Glucose: 95 mg/dl (ref 70–140)
Potassium: 4.1 mEq/L (ref 3.5–5.1)
Sodium: 140 mEq/L (ref 136–145)
Total Bilirubin: 1.09 mg/dL (ref 0.20–1.20)
Total Protein: 6.7 g/dL (ref 6.4–8.3)

## 2015-11-02 NOTE — Progress Notes (Signed)
Randall Turner presents today for phlebotomy per MD orders. Phlebotomy procedure started at 1550 and ended at 1600. 500 grams removed. Patient observed for 30 minutes after procedure without any incident. Patient tolerated procedure well. IV needle removed intact.

## 2015-11-02 NOTE — Patient Instructions (Signed)

## 2015-11-02 NOTE — Progress Notes (Signed)
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  Chief Complaint  Patient presents with  . OTHER    New Patient  : I have hemochromatosis.  HPI: [name] is a very nice 64 year old white male. He is from [place]. We had a good time talking about [place].  He is retired. 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  He apparently has had some issues with his liver. He was followed by [name], who was very thorough in his evaluation. He ultimately found that Mr. Sieler had hemochromatosis. He did an ultrasound of the liver. This showed a fatty liver. No other abnormalities were noted. He had no splenomegaly. He was fact has some slightly elevated LFTs.  He ultimately did a genetic test. He was found to have hemochromatosis. 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  Mr. Pott really does not drink. He does not smoke. He's had no abdominal surgery.  As far as he knows, there is no one in his family that has hemochromatosis. No one died early of heart disease or liver disease. His mother had lung cancer but was never a smoker.  His father had prostate cancer.  He's had no issues with diabetes. He's had no joint problems. He's had no thyroid issues. He's had no problems with low testosterone.  Overall, his performance status is ECOG 0.                 Past Medical History  Diagnosis Date  . Allergy   . GERD (gastroesophageal reflux disease)   . Asthma     childhood asthma  . Kidney stone   :  Past Surgical History  Procedure Laterality Date  . Vasectomy    . Patella removed right knee    . Bunionectomy    . Tonsillectomy    :   Current outpatient prescriptions:  .  aspirin EC 81 MG tablet, Take 162 mg by mouth daily., Disp: , Rfl:  .  losartan (COZAAR) 50 MG tablet, Take 1 tablet (50 mg total) by mouth daily., Disp: 90 tablet, Rfl: 3 .  omeprazole (PRILOSEC) 20 MG  capsule, TAKE ONE CAPSULE BY MOUTH ONCE DAILY, Disp: 90 capsule, Rfl: 3 .  OVER THE COUNTER MEDICATION, Take 1 tablet by mouth. Mens multivitamin without iron, Disp: , Rfl: :  :  Allergies  Allergen Reactions  . Cortisone     swelling  :  Family History  Problem Relation Age of Onset  . Cancer Mother   . Gallstones Mother   . Cancer Father   . Colon cancer Neg Hx   . Pancreatic cancer Neg Hx   . Stomach cancer Neg Hx   . Heart disease Maternal Grandmother   . Cancer Maternal Grandfather   . Heart disease Paternal Grandmother   :  Social History   Social History  . Marital Status: Married    Spouse Name: N/A  . Number of Children: N/A  . Years of Education: N/A   Occupational History  . Not on file.   Social History Main Topics  . Smoking status: Never Smoker   . Smokeless tobacco: Never Used  . Alcohol Use: 0.0 oz/week    0 Standard drinks or equivalent per week     Comment: ocas 2 drinks  . Drug Use: No  . Sexual Activity: Not on file   Other Topics Concern  . Not on file   Social History  Narrative   Married. Education: College.   :  Pertinent items are noted in HPI.  Exam: @IPVITALS @ Well developed and well-nourished white male in no obvious distress. Vital signs are temperature 97.3. Pulse 77. Blood pressure 141/97. Weight is 240 pounds. Head and neck exam shows no ocular or oral lesions. He has no scleral icterus. He has no adenopathy in the neck. Lungs are clear bilaterally. Cardiac exam regular rate and rhythm with no murmurs, rubs or bruits. Abdomen is soft. He is mildly obese. He has good bowel sounds. There is no fluid wave. There is no palpable liver or spleen tip. Back exam shows no tenderness over the spine, ribs or hips. Extremities shows no clubbing, cyanosis or edema. He has no joint swelling in his hands. Neurological exam shows no focal neurological deficits. Skin exam shows no rashes, ecchymoses or petechia.    Recent Labs  11/02/15 1322   WBC 7.1  HGB 15.9  HCT 44.8  PLT 228   No results for input(s): NA, K, CL, CO2, GLUCOSE, BUN, CREATININE, CALCIUM in the last 72 hours.  Blood smear review:  none   Pathology: None     Assessment and Plan:  Mr. Dommer is a 64 year old white male. He has hemochromatosis.  I think the fact that he is a compound heterozygote has actually helped him. I would've thought that his iron levels would've been a whole lot higher. I think that would've been much higher if he was homozygous for one of the mutations.  I think we have to make sure that we get the iron taken out of him. I want to make sure that his ferritin gets below 100. I think this would prevent him from having any issues with iron overload.  I do not believe that the elevated LFTs are from hemochromatosis. I dissolving his iron levels are all that bad. His iron saturation is only 51%.  I spoke to he and his wife for about 45 minutes. His wife currently is undergoing radiation therapy for breast cancer. She is about one third the way through.  I think that we probably can get him phlebotomized weekly for the next 3 weeks. At that point, his ferritin should be down below 100. We can then hopefully is his phlebotomy appointment out further.  I will plan to see him back myself in another 4-5 weeks.

## 2015-11-03 LAB — AFP TUMOR MARKER: AFP-Tumor Marker: 3 ng/mL (ref <6.1)

## 2015-11-03 LAB — IRON AND TIBC CHCC
%SAT: 51 % (ref 20–55)
Iron: 149 ug/dL (ref 42–163)
TIBC: 293 ug/dL (ref 202–409)
UIBC: 143 ug/dL (ref 117–376)

## 2015-11-03 LAB — FERRITIN CHCC: Ferritin: 303 ng/ml (ref 22–316)

## 2015-11-09 ENCOUNTER — Ambulatory Visit (HOSPITAL_BASED_OUTPATIENT_CLINIC_OR_DEPARTMENT_OTHER): Payer: 59

## 2015-11-09 NOTE — Patient Instructions (Signed)

## 2015-11-16 ENCOUNTER — Ambulatory Visit (HOSPITAL_BASED_OUTPATIENT_CLINIC_OR_DEPARTMENT_OTHER): Payer: 59

## 2015-11-16 NOTE — Progress Notes (Signed)
Randall Turner presents today for phlebotomy per MD orders. Phlebotomy procedure started at 1415 and ended at 1423. 500 ml  removed. Patient observed for 30 minutes after procedure without any incident. Patient tolerated procedure well. IV needle removed intact.

## 2015-11-16 NOTE — Patient Instructions (Signed)
Therapeutic Phlebotomy, Care After  Refer to this sheet in the next few weeks. These instructions provide you with information about caring for yourself after your procedure. Your health care provider may also give you more specific instructions. Your treatment has been planned according to current medical practices, but problems sometimes occur. Call your health care provider if you have any problems or questions after your procedure.  WHAT TO EXPECT AFTER THE PROCEDURE  After your procedure, it is common to have:   Light-headedness or dizziness. You may feel faint.   Nausea.   Tiredness.  HOME CARE INSTRUCTIONS  Activities   Return to your normal activities as directed by your health care provider. Most people can go back to their normal activities right away.   Avoid strenuous physical activity and heavy lifting or pulling for about 5 hours after the procedure. Do not lift anything that is heavier than 10 lb (4.5 kg).   Athletes should avoid strenuous exercise for at least 12 hours.   Change positions slowly for the remainder of the day. This will help to prevent light-headedness or fainting.   If you feel light-headed, lie down until the feeling goes away.  Eating and Drinking   Be sure to eat well-balanced meals for the next 24 hours.   Drink enough fluid to keep your urine clear or pale yellow.   Avoid drinking alcohol on the day that you had the procedure.  Care of the Needle Insertion Site   Keep your bandage dry. You can remove the bandage after about 5 hours or as directed by your health care provider.   If you have bleeding from the needle insertion site, elevate your arm and press firmly on the site until the bleeding stops.   If you have bruising at the site, apply ice to the area:   Put ice in a plastic bag.   Place a towel between your skin and the bag.   Leave the ice on for 20 minutes, 2-3 times a day for the first 24 hours.   If the swelling does not go away after 24 hours, apply  a warm, moist washcloth to the area for 20 minutes, 2-3 times a day.  General Instructions   Avoid smoking for at least 30 minutes after the procedure.   Keep all follow-up visits as directed by your health care provider. It is important to continue with further therapeutic phlebotomy treatments as directed.  SEEK MEDICAL CARE IF:   You have redness, swelling, or pain at the needle insertion site.   You have fluid, blood, or pus coming from the needle insertion site.   You feel light-headed, dizzy, or nauseated, and the feeling does not go away.   You notice new bruising at the needle insertion site.   You feel weaker than normal.   You have a fever or chills.  SEEK IMMEDIATE MEDICAL CARE IF:   You have severe nausea or vomiting.   You have chest pain.   You have trouble breathing.    This information is not intended to replace advice given to you by your health care provider. Make sure you discuss any questions you have with your health care provider.    Document Released: 05/14/2011 Document Revised: 04/26/2015 Document Reviewed: 12/06/2014  Elsevier Interactive Patient Education 2016 [employer]vier Inc[employer]

## 2015-11-30 ENCOUNTER — Ambulatory Visit (HOSPITAL_BASED_OUTPATIENT_CLINIC_OR_DEPARTMENT_OTHER): Payer: 59

## 2015-11-30 ENCOUNTER — Ambulatory Visit (HOSPITAL_BASED_OUTPATIENT_CLINIC_OR_DEPARTMENT_OTHER): Payer: 59 | Admitting: Hematology & Oncology

## 2015-11-30 ENCOUNTER — Other Ambulatory Visit (HOSPITAL_BASED_OUTPATIENT_CLINIC_OR_DEPARTMENT_OTHER): Payer: 59

## 2015-11-30 ENCOUNTER — Encounter: Payer: Self-pay | Admitting: Hematology & Oncology

## 2015-11-30 HISTORY — DX: Hereditary hemochromatosis: E83.110

## 2015-11-30 LAB — COMPREHENSIVE METABOLIC PANEL
ALT: 108 U/L — ABNORMAL HIGH (ref 0–55)
AST: 64 U/L — ABNORMAL HIGH (ref 5–34)
Albumin: 4.1 g/dL (ref 3.5–5.0)
Alkaline Phosphatase: 118 U/L (ref 40–150)
Anion Gap: 11 mEq/L (ref 3–11)
BUN: 19.1 mg/dL (ref 7.0–26.0)
CO2: 21 mEq/L — ABNORMAL LOW (ref 22–29)
Calcium: 9.8 mg/dL (ref 8.4–10.4)
Chloride: 108 mEq/L (ref 98–109)
Creatinine: 1.3 mg/dL (ref 0.7–1.3)
EGFR: 60 mL/min/{1.73_m2} — ABNORMAL LOW (ref >90)
Glucose: 120 mg/dl (ref 70–140)
Potassium: 4.4 mEq/L (ref 3.5–5.1)
Sodium: 140 mEq/L (ref 136–145)
Total Bilirubin: 0.84 mg/dL (ref 0.20–1.20)
Total Protein: 7 g/dL (ref 6.4–8.3)

## 2015-11-30 LAB — CBC WITH DIFFERENTIAL (CANCER CENTER ONLY)
BASO#: 0 10*3/uL (ref 0.0–0.2)
BASO%: 0.6 % (ref 0.0–2.0)
EOS%: 4.8 % (ref 0.0–7.0)
Eosinophils Absolute: 0.3 10*3/uL (ref 0.0–0.5)
HCT: 42.3 % (ref 38.7–49.9)
HGB: 14.6 g/dL (ref 13.0–17.1)
LYMPH#: 1.6 10*3/uL (ref 0.9–3.3)
LYMPH%: 24.2 % (ref 14.0–48.0)
MCH: 31.2 pg (ref 28.0–33.4)
MCHC: 34.5 g/dL (ref 32.0–35.9)
MCV: 90 fL (ref 82–98)
MONO#: 0.8 10*3/uL (ref 0.1–0.9)
MONO%: 11.7 % (ref 0.0–13.0)
NEUT#: 3.8 10*3/uL (ref 1.5–6.5)
NEUT%: 58.7 % (ref 40.0–80.0)
Platelets: 240 10*3/uL (ref 145–400)
RBC: 4.68 10*6/uL (ref 4.20–5.70)
RDW: 13.7 % (ref 11.1–15.7)
WBC: 6.5 10*3/uL (ref 4.0–10.0)

## 2015-11-30 NOTE — Progress Notes (Signed)
Hematology and Oncology Follow Up Visit  Randall Turner [id number] 14-Nov-1951 64 y.o. 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  Current Therapy:    Phlebotomy to maintain ferritin less than 100     Interim History:  [name] is back for follow-up. He is doing pretty well. He's had 3 phlebotomies so far. We first saw him, his ferritin was 303 with an iron saturation of 51%.  He is had no problems with the phlebotomies. They did not make him too tired.  He's had no issues with fevers sweats or chills.  He had an abdominal ultrasound done back in September. This showed a fatty liver.  He's had no cough. He's had no shortness of breath. He's had no rashes. He has had no leg swelling.  He is still working without difficulties.  His wife just completed radiation treatments for breast cancer.  Hopefully, they will be oh to travel some next year.  His performance status is ECOG 0.    Medications:  Current outpatient prescriptions:  .  aspirin EC 81 MG tablet, Take 162 mg by mouth daily., Disp: , Rfl:  .  losartan (COZAAR) 50 MG tablet, Take 1 tablet (50 mg total) by mouth daily., Disp: 90 tablet, Rfl: 3 .  omeprazole (PRILOSEC) 20 MG capsule, TAKE ONE CAPSULE BY MOUTH ONCE DAILY, Disp: 90 capsule, Rfl: 3 .  OVER THE COUNTER MEDICATION, Take 1 tablet by mouth. Mens multivitamin without iron, Disp: , Rfl:   Allergies:  Allergies  Allergen Reactions  . Cortisone     swelling    Past Medical History, Surgical history, Social history, and Family History were reviewed and updated.  Review of Systems: As above  Physical Exam:  height is 5\' 10"  (1.778 m) and weight is 243 lb (110.224 kg). His oral temperature is 97.6 F (36.4 C). His blood pressure is 135/86 and his pulse is 101. His respiration is 18.   Wt Readings from Last 3 Encounters:  11/30/15 243 lb (110.224 kg)  11/02/15 240 lb (108.863 kg)  09/15/15 241  lb 9.6 oz (109.589 kg)     Obese white gentleman in no obvious distress. Head and neck exam shows no ocular or oral lesions. There are no palpable cervical or supraclavicular lymph nodes. Lungs are clear to percussion and ask rotation bilaterally. Cardiac exam regular rate and rhythm with no murmurs, rubs or bruits. Abdomen is soft. He has good bowel sounds. There is no fluid wave. He is moderately obese. There is no palpable liver or spleen tip. Extremities shows no clubbing, cyanosis or edema. Neurological exam shows no focal neurological deficits. Skin exam shows no rashes, ecchymoses or petechia.  Lab Results  Component Value Date   WBC 6.5 11/30/2015   HGB 14.6 11/30/2015   HCT 42.3 11/30/2015   MCV 90 11/30/2015   PLT 240 11/30/2015     Chemistry      Component Value Date/Time   NA 140 11/02/2015 1321   NA 138 09/15/2015 1009   K 4.1 11/02/2015 1321   K 4.4 09/15/2015 1009   CL 102 09/15/2015 1009   CO2 22 11/02/2015 1321   CO2 24 09/15/2015 1009   BUN 16.6 11/02/2015 1321   BUN 18 09/15/2015 1009   CREATININE 1.0 11/02/2015 1321   CREATININE 1.23 09/15/2015 1009      Component Value Date/Time   CALCIUM 9.6 11/02/2015 1321   CALCIUM 9.5 09/15/2015 1009   ALKPHOS 132 11/02/2015 1321  ALKPHOS 104 09/15/2015 1009   AST 67* 11/02/2015 1321   AST 52* 09/15/2015 1009   ALT 132* 11/02/2015 1321   ALT 99* 09/15/2015 1009   BILITOT 1.09 11/02/2015 1321   BILITOT 1.2 09/15/2015 1009         Impression and Plan: [name] is a 64 year old gentleman with hemochromatosis. He is a compound heterozygote.  I'll be interested see what his iron studies are today.  We will go ahead and phlebotomize him just to be on the safe side.  I still feel that he probably has hepatic steatosis that is probably causing his liver enzymes to be up a little bit.  We will plan to get him back to see [place] in about 6 weeks. We will see what his iron studies show.     Volanda Napoleon,  MD 12/7/20162:18 PM

## 2015-11-30 NOTE — Patient Instructions (Signed)

## 2015-12-01 ENCOUNTER — Encounter: Payer: Self-pay | Admitting: *Deleted

## 2015-12-01 LAB — FERRITIN: Ferritin: 95 ng/ml (ref 22–316)

## 2015-12-01 LAB — IRON AND TIBC
%SAT: 29 % (ref 20–55)
Iron: 100 ug/dL (ref 42–163)
TIBC: 338 ug/dL (ref 202–409)
UIBC: 238 ug/dL (ref 117–376)

## 2016-01-08 ENCOUNTER — Telehealth: Payer: Self-pay | Admitting: Family Medicine

## 2016-01-08 NOTE — Telephone Encounter (Signed)
lmom to call and reschedule appt with daub 

## 2016-01-11 ENCOUNTER — Ambulatory Visit (INDEPENDENT_AMBULATORY_CARE_PROVIDER_SITE_OTHER): Payer: BLUE CROSS/BLUE SHIELD | Admitting: Family Medicine

## 2016-01-11 VITALS — BP 120/78 | HR 100 | Temp 97.7°F | Resp 18 | Ht 71.0 in | Wt 239.2 lb

## 2016-01-11 DIAGNOSIS — J209 Acute bronchitis, unspecified: Secondary | ICD-10-CM

## 2016-01-11 MED ORDER — HYDROCOD POLST-CPM POLST ER 10-8 MG/5ML PO SUER: 5.0000 mL | Freq: Two times a day (BID) | ORAL | Status: DC | PRN

## 2016-01-11 MED ORDER — ALBUTEROL SULFATE (2.5 MG/3ML) 0.083% IN NEBU
2.5000 mg | INHALATION_SOLUTION | Freq: Once | RESPIRATORY_TRACT | Status: AC
  Administered 2016-01-11: 2.5 mg via RESPIRATORY_TRACT

## 2016-01-11 MED ORDER — AZITHROMYCIN 250 MG PO TABS: ORAL_TABLET | ORAL | Status: DC

## 2016-01-11 MED ORDER — GUAIFENESIN ER 1200 MG PO TB12: 1.0000 | ORAL_TABLET | Freq: Two times a day (BID) | ORAL | Status: DC | PRN

## 2016-01-11 MED ORDER — ALBUTEROL SULFATE 108 (90 BASE) MCG/ACT IN AEPB: 2.0000 | INHALATION_SPRAY | RESPIRATORY_TRACT | Status: DC | PRN

## 2016-01-11 NOTE — Progress Notes (Signed)
Subjective:  By signing my name below, I, Moises Blood, attest that this documentation has been prepared under the direction and in the presence of Delman Cheadle, MD. Electronically Signed: Moises Blood, [place]. 01/11/2016 , 2:23 PM .  Patient was seen in Room 12 .   Patient ID: Randall Turner, male    DOB: 08/02/1951, 65 y.o.   MRN: [id number] Chief Complaint  Patient presents with  . Sore Throat    x 5 days  . Cough    productive, clear  . Sinusitis    allergic to cats, has been around cats recently  . Nasal Congestion   HPI Randall Turner is a 65 y.o. male who presents to [facility] complaining of URI symptoms that started about 5 days ago.  He was at his son's house and he has allergies towards cats. He notes that it started with sinus congestion and productive cough (clear). He states it feels worse at night. He's tried decongestants, antihistamines and nasal spray; however, he believes it made him worse. He wakes up at night due to coughs. He denies shortness of breath, fever and chills. He had childhood asthma. He denies recent use of inhaler. He denies taking allergy medications. He denies history of smoking.   Past Medical History  Diagnosis Date  . Allergy   . GERD (gastroesophageal reflux disease)   . Asthma     childhood asthma  . Kidney stone   . 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 ([place]) 11/30/2015   Prior to Admission medications   Medication Sig Start Date End Date Taking? Authorizing Provider  aspirin EC 81 MG tablet Take 162 mg by mouth daily.   Yes Historical Provider, MD  losartan (COZAAR) 50 MG tablet Take 1 tablet (50 mg total) by mouth daily. 09/15/15  Yes Darlyne Russian, MD  omeprazole (PRILOSEC) 20 MG capsule TAKE ONE CAPSULE BY MOUTH ONCE DAILY 09/15/15  Yes Darlyne Russian, MD  OVER THE COUNTER MEDICATION Take 1 tablet by mouth. Mens multivitamin without iron   Yes Historical Provider, MD   Allergies  Allergen Reactions  .  Cortisone     swelling    Review of Systems  Constitutional: Positive for fatigue. Negative for fever and chills.  HENT: Positive for congestion, postnasal drip, sinus pressure, sore throat and voice change. Negative for ear pain, rhinorrhea and sneezing.   Respiratory: Positive for cough and wheezing. Negative for shortness of breath.        Objective:   Physical Exam  Constitutional: He is oriented to person, place, and time. He appears well-developed and well-nourished. No distress.  HENT:  Head: Normocephalic and atraumatic.  Right Ear: Tympanic membrane is injected (slightly).  Left Ear: Tympanic membrane is injected (slightly).  Mouth/Throat: No posterior oropharyngeal erythema.  Nares pale and boggy, postnasal drip present  Eyes: EOM are normal. Pupils are equal, round, and reactive to light.  Neck: Neck supple.  Cardiovascular: Normal rate, regular rhythm, S1 normal, S2 normal and normal heart sounds.   No murmur heard. Pulmonary/Chest: Effort normal. No respiratory distress.  Coarse breath sounds throughout  Musculoskeletal: Normal range of motion.  Lymphadenopathy:       Head (right side): Tonsillar adenopathy present.       Head (left side): Tonsillar adenopathy present.  Neurological: He is alert and oriented to person, place, and time.  Skin: Skin is warm and dry.  Psychiatric: He has a normal mood and affect. His behavior is normal.  Nursing  note and vitals reviewed.   BP 120/78 mmHg  Pulse 100  Temp(Src) 97.7 F (36.5 C) (Oral)  Resp 18  Ht 5\' 11"  (1.803 m)  Wt 239 lb 3.2 oz (108.5 kg)  BMI 33.38 kg/m2  SpO2 96%     Assessment & Plan:   1. Acute bronchitis, unspecified organism     Meds ordered this encounter  Medications  . albuterol (PROVENTIL) (2.5 MG/3ML) 0.083% nebulizer solution 2.5 mg    Sig:   . chlorpheniramine-HYDROcodone (TUSSIONEX PENNKINETIC ER) 10-8 MG/5ML SUER    Sig: Take 5 mLs by mouth every 12 (twelve) hours as needed.     Dispense:  120 mL    Refill:  0  . Albuterol Sulfate (PROAIR RESPICLICK) 123XX123 (90 Base) MCG/ACT AEPB    Sig: Inhale 2 puffs into the lungs every 4 (four) hours as needed.    Dispense:  1 each    Refill:  0  . azithromycin (ZITHROMAX) 250 MG tablet    Sig: Take 2 tabs PO x 1 dose, then 1 tab PO QD x 4 days    Dispense:  6 tablet    Refill:  0  . Guaifenesin (MUCINEX MAXIMUM STRENGTH) 1200 MG TB12    Sig: Take 1 tablet (1,200 mg total) by mouth every 12 (twelve) hours as needed.    Dispense:  14 tablet    Refill:  1    I personally performed the services described in this documentation, which was scribed in my presence. The recorded information has been reviewed and considered, and addended by me as needed.  Delman Cheadle, MD MPH

## 2016-01-11 NOTE — Patient Instructions (Signed)

## 2016-03-15 ENCOUNTER — Ambulatory Visit (INDEPENDENT_AMBULATORY_CARE_PROVIDER_SITE_OTHER): Payer: BLUE CROSS/BLUE SHIELD | Admitting: Emergency Medicine

## 2016-03-15 ENCOUNTER — Ambulatory Visit: Payer: 59 | Admitting: Emergency Medicine

## 2016-03-15 VITALS — BP 124/92 | HR 87 | Temp 97.7°F | Resp 16 | Ht 70.25 in | Wt 243.2 lb

## 2016-03-15 DIAGNOSIS — Z125 Encounter for screening for malignant neoplasm of prostate: Secondary | ICD-10-CM

## 2016-03-15 LAB — FERRITIN: Ferritin: 23 ng/mL (ref 20–380)

## 2016-03-15 NOTE — Progress Notes (Signed)
Patient ID: Randall Turner, male   DOB: 15-Jan-1951, 65 y.o.   MRN: [id number]     By signing my name below, I, Zola Button, attest that this documentation has been prepared under the direction and in the presence of Arlyss Queen, MD.  Electronically Signed: Zola Button, Medical Scribe. 03/15/2016. 4:09 PM.   Chief Complaint:  Chief Complaint  Patient presents with  . Follow-up    6 mos  . Hypertension    HPI: Randall Turner is a 65 y.o. male with a history of HTN and hemochromatosis who reports to [facility] today for a follow-up. Patient is trying to find a cheaper place to have his phlebotomy done. One of his sons had been tested for hemochromatosis and was told his son was negative, but he does not believe it as his son did not have genetic testing. Patient would like his PSA and ferritin checked. FMHx includes prostate cancer in his father (diagnosed in his 31s). He is due for colonoscopy in October this year; polyps were found on his last colonoscopy in October 2014, and he was told to repeat in 3 years.  Patient reports having a few lesions on his back that he would like examined. Spouse is concerned about one in particular that has been increasing in size and described as looking like a "chocolate Cheerio." He does not want to go to dermatology to have them burned off.  Past Medical History  Diagnosis Date  . Allergy   . GERD (gastroesophageal reflux disease)   . Asthma     childhood asthma  . Kidney stone   . 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 ([place]) 11/30/2015   Past Surgical History  Procedure Laterality Date  . Vasectomy    . Patella removed right knee    . Bunionectomy    . Tonsillectomy     Social History   Social History  . Marital Status: Married    Spouse Name: N/A  . Number of Children: N/A  . Years of Education: N/A   Social History Main Topics  . Smoking status: Never Smoker   . Smokeless tobacco: Never Used  . Alcohol  Use: 0.0 oz/week    0 Standard drinks or equivalent per week     Comment: ocas 2 drinks  . Drug Use: No  . Sexual Activity: Not on file   Other Topics Concern  . Not on file   Social History Narrative   Married. Education: [employer].    Family History  Problem Relation Age of Onset  . Cancer Mother   . Gallstones Mother   . Cancer Father   . Colon cancer Neg Hx   . Pancreatic cancer Neg Hx   . Stomach cancer Neg Hx   . Heart disease Maternal Grandmother   . Cancer Maternal Grandfather   . Heart disease Paternal Grandmother    Allergies  Allergen Reactions  . Cortisone     swelling   Prior to Admission medications   Medication Sig Start Date End Date Taking? Authorizing Provider  Albuterol Sulfate (PROAIR RESPICLICK) 123XX123 (90 Base) MCG/ACT AEPB Inhale 2 puffs into the lungs every 4 (four) hours as needed. 01/11/16   Shawnee Knapp, MD  aspirin EC 81 MG tablet Take 162 mg by mouth daily.    Historical Provider, MD  azithromycin (ZITHROMAX) 250 MG tablet Take 2 tabs PO x 1 dose, then 1 tab PO QD x 4 days 01/11/16   Shawnee Knapp, MD  chlorpheniramine-HYDROcodone (TUSSIONEX PENNKINETIC ER) 10-8 MG/5ML SUER Take 5 mLs by mouth every 12 (twelve) hours as needed. 01/11/16   Shawnee Knapp, MD  Guaifenesin [facility] MAXIMUM STRENGTH) 1200 MG TB12 Take 1 tablet (1,200 mg total) by mouth every 12 (twelve) hours as needed. 01/11/16   Shawnee Knapp, MD  losartan (COZAAR) 50 MG tablet Take 1 tablet (50 mg total) by mouth daily. 09/15/15   Darlyne Russian, MD  omeprazole (PRILOSEC) 20 MG capsule TAKE ONE CAPSULE BY MOUTH ONCE DAILY 09/15/15   Darlyne Russian, MD  OVER THE COUNTER MEDICATION Take 1 tablet by mouth. Mens multivitamin without iron    Historical Provider, MD     ROS: The patient denies fevers, chills, night sweats, unintentional weight loss, chest pain, palpitations, wheezing, dyspnea on exertion, nausea, vomiting, dysuria, hematuria, melena, numbness, or tingling.  All other systems have been  reviewed and were otherwise negative with the exception of those mentioned in the HPI and as above.    PHYSICAL EXAM: Filed Vitals:   03/15/16 1600 03/15/16 1603  BP: 124/90 124/92  Pulse: 87   Temp: 97.7 F (36.5 C)   Resp: 16    Body mass index is 34.66 kg/(m^2).   General: Alert, no acute distress HEENT:  Normocephalic, atraumatic, oropharynx patent. Eye: [facility] Cardiovascular: Occasional skip beat, otherwise heart exam normal. Respiratory: Clear to auscultation bilaterally.  No wheezes, rales, or rhonchi.  No cyanosis, no use of accessory musculature Abdominal: No organomegaly, abdomen is soft and non-tender, positive bowel sounds.  No masses. Musculoskeletal: Gait intact. No edema, tenderness Skin: No rashes. Neurologic: Facial musculature symmetric. Psychiatric: Patient acts appropriately throughout our interaction. Lymphatic: No cervical or submandibular lymphadenopathy     LABS:    EKG/XRAY:   Primary read interpreted by [name] at [facility].   ASSESSMENT/PLAN: Patient has a diagnosis of hemochromatosis. Last phlebotomy was in December with ferritin level under 100. Ferritin level done today. Patient also has FMHx of prostate cancer. At time of physical in September, no PSA was done so PSA was done today. Previous PSAs have been normal.He did have a rectal exam in September which was normal.    Gross sideeffects, risk and benefits, and alternatives of medications d/w patient. Patient is aware that all medications have potential sideeffects and we are unable to predict every sideeffect or drug-drug interaction that may occur.  Arlyss Queen MD 03/15/2016 4:09 PM

## 2016-03-15 NOTE — Patient Instructions (Signed)
     IF you received an x-ray today, you will receive an invoice from [facility]. Please contact [facility] at [phone] with questions or concerns regarding your invoice.   IF you received labwork today, you will receive an invoice from [facility]. Please contact [facility] at [phone] with questions or concerns regarding your invoice.   Our billing staff will not be able to assist you with questions regarding bills from these companies.  You will be contacted with the lab results as soon as they are available. The fastest way to get your results is to activate your My Chart account. Instructions are located on the last page of this paperwork. If you have not heard from us regarding the results in 2 weeks, please contact this office.      

## 2016-03-16 LAB — PSA: PSA: 1.97 ng/mL (ref <=4.00)

## 2016-05-30 ENCOUNTER — Ambulatory Visit: Payer: 59

## 2016-05-30 ENCOUNTER — Encounter: Payer: Self-pay | Admitting: Hematology & Oncology

## 2016-05-30 ENCOUNTER — Ambulatory Visit (HOSPITAL_BASED_OUTPATIENT_CLINIC_OR_DEPARTMENT_OTHER): Payer: BLUE CROSS/BLUE SHIELD | Admitting: Hematology & Oncology

## 2016-05-30 ENCOUNTER — Other Ambulatory Visit (HOSPITAL_BASED_OUTPATIENT_CLINIC_OR_DEPARTMENT_OTHER): Payer: BLUE CROSS/BLUE SHIELD

## 2016-05-30 LAB — CBC WITH DIFFERENTIAL (CANCER CENTER ONLY)
BASO#: 0 10*3/uL (ref 0.0–0.2)
BASO%: 0.7 % (ref 0.0–2.0)
EOS%: 3.6 % (ref 0.0–7.0)
Eosinophils Absolute: 0.2 10*3/uL (ref 0.0–0.5)
HCT: 44.1 % (ref 38.7–49.9)
HGB: 15.4 g/dL (ref 13.0–17.1)
LYMPH#: 1.6 10*3/uL (ref 0.9–3.3)
LYMPH%: 25.9 % (ref 14.0–48.0)
MCH: 30 pg (ref 28.0–33.4)
MCHC: 34.9 g/dL (ref 32.0–35.9)
MCV: 86 fL (ref 82–98)
MONO#: 0.8 10*3/uL (ref 0.1–0.9)
MONO%: 13.4 % — ABNORMAL HIGH (ref 0.0–13.0)
NEUT#: 3.5 10*3/uL (ref 1.5–6.5)
NEUT%: 56.4 % (ref 40.0–80.0)
Platelets: 202 10*3/uL (ref 145–400)
RBC: 5.13 10*6/uL (ref 4.20–5.70)
RDW: 14.2 % (ref 11.1–15.7)
WBC: 6.1 10*3/uL (ref 4.0–10.0)

## 2016-05-30 LAB — COMPREHENSIVE METABOLIC PANEL
ALT: 83 U/L — ABNORMAL HIGH (ref 0–55)
AST: 49 U/L — ABNORMAL HIGH (ref 5–34)
Albumin: 4 g/dL (ref 3.5–5.0)
Alkaline Phosphatase: 104 U/L (ref 40–150)
Anion Gap: 8 mEq/L (ref 3–11)
BUN: 19.7 mg/dL (ref 7.0–26.0)
CO2: 23 mEq/L (ref 22–29)
Calcium: 9.6 mg/dL (ref 8.4–10.4)
Chloride: 110 mEq/L — ABNORMAL HIGH (ref 98–109)
Creatinine: 1.1 mg/dL (ref 0.7–1.3)
EGFR: 73 mL/min/{1.73_m2} — ABNORMAL LOW (ref >90)
Glucose: 117 mg/dl (ref 70–140)
Potassium: 4.3 mEq/L (ref 3.5–5.1)
Sodium: 141 mEq/L (ref 136–145)
Total Bilirubin: 0.7 mg/dL (ref 0.20–1.20)
Total Protein: 6.8 g/dL (ref 6.4–8.3)

## 2016-05-30 NOTE — Progress Notes (Signed)
Phlebotomy not needed per Dr Marin Olp.

## 2016-05-30 NOTE — Progress Notes (Signed)
Hematology and Oncology Follow Up Visit  Randall Turner [id number] February 19, 1951 65 y.o. 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  Current Therapy:    Phlebotomy to maintain ferritin less than 100     Interim History:  Randall Turner is back for follow-up. He is doing pretty well. He actually had a ferritin level done by his family doctor recently. In March, the ferritin was 23.  We have phlebotomize him about 3 or 4 times. We first saw him, his ferritin was 303  and is coming down quite nicely, along with the iron saturation.  Otherwise, he is really had no problems. He has 2 sons that need to be tested. One lives in [place]. I told him that we can always see his son locally and try to help out.  He has had no cough. He has had no arthritic issues. He does have some arthralgias. I don't think this is related to the hemochromatosis.  He has had no change in bowel or bladder habits. He has had no cough. He has had no mouth sores.   His performance status is ECOG 0.   Medications:  Current outpatient prescriptions:  .  Albuterol Sulfate (PROAIR RESPICLICK) 123XX123 (90 Base) MCG/ACT AEPB, Inhale 2 puffs into the lungs every 4 (four) hours as needed., Disp: 1 each, Rfl: 0 .  aspirin EC 81 MG tablet, Take 162 mg by mouth daily., Disp: , Rfl:  .  losartan (COZAAR) 50 MG tablet, Take 1 tablet (50 mg total) by mouth daily., Disp: 90 tablet, Rfl: 3 .  omeprazole (PRILOSEC) 20 MG capsule, TAKE ONE CAPSULE BY MOUTH ONCE DAILY, Disp: 90 capsule, Rfl: 3 .  OVER THE COUNTER MEDICATION, Take 1 tablet by mouth. Mens multivitamin without iron, Disp: , Rfl:   Allergies:  Allergies  Allergen Reactions  . Cortisone     swelling    Past Medical History, Surgical history, Social history, and Family History were reviewed and updated.  Review of Systems: As above  Physical Exam:  height is 5\' 10"  (1.778 m) and weight is 243 lb (110.224 kg).  His oral temperature is 97.4 F (36.3 C). His blood pressure is 135/80 and his pulse is 74. His respiration is 16.   Wt Readings from Last 3 Encounters:  05/30/16 243 lb (110.224 kg)  03/15/16 243 lb 3.2 oz (110.315 kg)  01/11/16 239 lb 3.2 oz (108.5 kg)     Obese white gentleman in no obvious distress. Head and neck exam shows no ocular or oral lesions. There are no palpable cervical or supraclavicular lymph nodes. Lungs are clear to percussion and ask rotation bilaterally. Cardiac exam regular rate and rhythm with no murmurs, rubs or bruits. Abdomen is soft. He has good bowel sounds. There is no fluid wave. He is moderately obese. There is no palpable liver or spleen tip. Extremities shows no clubbing, cyanosis or edema. Neurological exam shows no focal neurological deficits. Skin exam shows no rashes, ecchymoses or petechia.  Lab Results  Component Value Date   WBC 6.1 05/30/2016   HGB 15.4 05/30/2016   HCT 44.1 05/30/2016   MCV 86 05/30/2016   PLT 202 05/30/2016     Chemistry      Component Value Date/Time   NA 140 11/30/2015 1250   NA 138 09/15/2015 1009   K 4.4 11/30/2015 1250   K 4.4 09/15/2015 1009   CL 102 09/15/2015 1009   CO2 21* 11/30/2015 1250   CO2  24 09/15/2015 1009   BUN 19.1 11/30/2015 1250   BUN 18 09/15/2015 1009   CREATININE 1.3 11/30/2015 1250   CREATININE 1.23 09/15/2015 1009      Component Value Date/Time   CALCIUM 9.8 11/30/2015 1250   CALCIUM 9.5 09/15/2015 1009   ALKPHOS 118 11/30/2015 1250   ALKPHOS 104 09/15/2015 1009   AST 64* 11/30/2015 1250   AST 52* 09/15/2015 1009   ALT 108* 11/30/2015 1250   ALT 99* 09/15/2015 1009   BILITOT 0.84 11/30/2015 1250   BILITOT 1.2 09/15/2015 1009         Impression and Plan: Randall Turner is a 65 year old gentleman with hemochromatosis. He is a compound heterozygote.  Of note, his liver function tests have been improving area and this might be from the phlebotomies.  We will see what his iron  studies show today. [place], we will not have to phlebotomize him.   I will make an appointment to see him back in 3 months. I think this would be reasonable in order to make life easier for him and still maintain close contact with his iron studies.   Volanda Napoleon, MD 6/7/20172:41 PM

## 2016-05-31 ENCOUNTER — Telehealth: Payer: Self-pay | Admitting: *Deleted

## 2016-05-31 ENCOUNTER — Encounter: Payer: Self-pay | Admitting: *Deleted

## 2016-05-31 LAB — FERRITIN: Ferritin: 23 ng/ml (ref 22–316)

## 2016-05-31 LAB — IRON AND TIBC
%SAT: 17 % — ABNORMAL LOW (ref 20–55)
Iron: 60 ug/dL (ref 42–163)
TIBC: 345 ug/dL (ref 202–409)
UIBC: 285 ug/dL (ref 117–376)

## 2016-05-31 NOTE — Telephone Encounter (Addendum)
Patient's phone will not accept phone calls from office. Will send via [place]  ----- Message from Volanda Napoleon, MD sent at 05/31/2016 10:08 AM EDT ----- Call - ferrtitin is fantastic - it is only 23!!!!  This is great!!!  Have a great summer!!!  Randall Turner

## 2016-07-30 ENCOUNTER — Encounter: Payer: Self-pay | Admitting: Internal Medicine

## 2016-08-17 ENCOUNTER — Other Ambulatory Visit: Payer: Self-pay | Admitting: Emergency Medicine

## 2016-08-17 DIAGNOSIS — I10 Essential (primary) hypertension: Secondary | ICD-10-CM

## 2016-08-31 ENCOUNTER — Ambulatory Visit: Payer: BLUE CROSS/BLUE SHIELD | Admitting: Hematology & Oncology

## 2016-08-31 ENCOUNTER — Other Ambulatory Visit: Payer: BLUE CROSS/BLUE SHIELD

## 2016-09-13 ENCOUNTER — Encounter: Payer: Self-pay | Admitting: Family

## 2016-09-13 ENCOUNTER — Other Ambulatory Visit (HOSPITAL_BASED_OUTPATIENT_CLINIC_OR_DEPARTMENT_OTHER): Payer: BLUE CROSS/BLUE SHIELD

## 2016-09-13 ENCOUNTER — Ambulatory Visit (HOSPITAL_BASED_OUTPATIENT_CLINIC_OR_DEPARTMENT_OTHER): Payer: BLUE CROSS/BLUE SHIELD | Admitting: Family

## 2016-09-13 ENCOUNTER — Ambulatory Visit (HOSPITAL_BASED_OUTPATIENT_CLINIC_OR_DEPARTMENT_OTHER): Payer: BLUE CROSS/BLUE SHIELD

## 2016-09-13 VITALS — BP 128/78 | HR 93 | Resp 16

## 2016-09-13 VITALS — BP 140/84 | HR 94 | Temp 97.8°F | Resp 16 | Ht 70.0 in | Wt 245.8 lb

## 2016-09-13 DIAGNOSIS — Z23 Encounter for immunization: Secondary | ICD-10-CM

## 2016-09-13 DIAGNOSIS — N63 Unspecified lump in unspecified breast: Secondary | ICD-10-CM

## 2016-09-13 DIAGNOSIS — R5383 Other fatigue: Secondary | ICD-10-CM | POA: Diagnosis not present

## 2016-09-13 LAB — CBC WITH DIFFERENTIAL (CANCER CENTER ONLY)
BASO#: 0.1 10*3/uL (ref 0.0–0.2)
BASO%: 0.7 % (ref 0.0–2.0)
EOS%: 3.9 % (ref 0.0–7.0)
Eosinophils Absolute: 0.3 10*3/uL (ref 0.0–0.5)
HCT: 46.6 % (ref 38.7–49.9)
HGB: 16.5 g/dL (ref 13.0–17.1)
LYMPH#: 1.8 10*3/uL (ref 0.9–3.3)
LYMPH%: 23.9 % (ref 14.0–48.0)
MCH: 30.4 pg (ref 28.0–33.4)
MCHC: 35.4 g/dL (ref 32.0–35.9)
MCV: 86 fL (ref 82–98)
MONO#: 0.9 10*3/uL (ref 0.1–0.9)
MONO%: 11.5 % (ref 0.0–13.0)
NEUT#: 4.5 10*3/uL (ref 1.5–6.5)
NEUT%: 60 % (ref 40.0–80.0)
Platelets: 237 10*3/uL (ref 145–400)
RBC: 5.42 10*6/uL (ref 4.20–5.70)
RDW: 13.7 % (ref 11.1–15.7)
WBC: 7.5 10*3/uL (ref 4.0–10.0)

## 2016-09-13 LAB — COMPREHENSIVE METABOLIC PANEL
ALT: 84 U/L — ABNORMAL HIGH (ref 0–55)
AST: 51 U/L — ABNORMAL HIGH (ref 5–34)
Albumin: 4 g/dL (ref 3.5–5.0)
Alkaline Phosphatase: 127 U/L (ref 40–150)
Anion Gap: 9 mEq/L (ref 3–11)
BUN: 22.6 mg/dL (ref 7.0–26.0)
CO2: 21 mEq/L — ABNORMAL LOW (ref 22–29)
Calcium: 9.7 mg/dL (ref 8.4–10.4)
Chloride: 108 mEq/L (ref 98–109)
Creatinine: 1.1 mg/dL (ref 0.7–1.3)
EGFR: 68 mL/min/{1.73_m2} — ABNORMAL LOW (ref >90)
Glucose: 116 mg/dl (ref 70–140)
Potassium: 4.2 mEq/L (ref 3.5–5.1)
Sodium: 139 mEq/L (ref 136–145)
Total Bilirubin: 1.06 mg/dL (ref 0.20–1.20)
Total Protein: 7.2 g/dL (ref 6.4–8.3)

## 2016-09-13 MED ORDER — INFLUENZA VAC SPLIT QUAD 0.5 ML IM SUSY
0.5000 mL | PREFILLED_SYRINGE | Freq: Once | INTRAMUSCULAR | Status: AC
  Administered 2016-09-13: 0.5 mL via INTRAMUSCULAR
  Filled 2016-09-13: qty 0.5

## 2016-09-13 NOTE — Progress Notes (Signed)
Hematology and Oncology Follow Up Visit  Randall Turner [id number] 1951-05-05 65 y.o. 09/13/2016   Principle Diagnosis:  Hemochromatosis - compound heterozygote for C282Y and H63D  Current Therapy:   Phlebotomy to maintain ferritin less than 100    Interim History:  Randall Turner is here today for follow-up. He is doing fairly well but having increased fatigue. His last phlebotomy was in December 2016. His Ferritin was 23 in March of this year. He feels that he would benefit from a phlebotomy at this time.  No fever, chills, n/v, cough, rash, dizziness, SOB, chest pain, palpitations, abdominal pain or changes in bowel or bladder habits.  He has a "pea sized" nodule that is tender on palpation with exam.  No lymphadenopathy found on exam. No episodes of bleeding or bruising.  No swelling, tenderness, numbness or tingling in his extremities.  He has maintained a food appetite and is staying well hydrated. His weight is stable.    Medications:    Medication List       Accurate as of 09/13/16  2:06 PM. Always use your most recent med list.          Albuterol Sulfate 108 (90 Base) MCG/ACT Aepb Commonly known as:  PROAIR RESPICLICK Inhale 2 puffs into the lungs every 4 (four) hours as needed.   aspirin EC 81 MG tablet Take 162 mg by mouth daily.   losartan 50 MG tablet Commonly known as:  COZAAR TAKE ONE TABLET BY MOUTH ONCE DAILY   omeprazole 20 MG capsule Commonly known as:  PRILOSEC TAKE ONE CAPSULE BY MOUTH ONCE DAILY   OVER THE COUNTER MEDICATION Take 1 tablet by mouth. Mens multivitamin without iron       Allergies:  Allergies  Allergen Reactions  . Cortisone     swelling    Past Medical History, Surgical history, Social history, and Family History were reviewed and updated.  Review of Systems: All other 10 point review of systems is negative.   Physical Exam:  vitals were not taken for this visit.  Wt Readings from Last 3 Encounters:  05/30/16 243  lb (110.2 kg)  03/15/16 243 lb 3.2 oz (110.3 kg)  01/11/16 239 lb 3.2 oz (108.5 kg)    Ocular: Sclerae unicteric, pupils equal, round and reactive to light Ear-nose-throat: Oropharynx clear, dentition fair Lymphatic: No cervical supraclavicular or axillary adenopathy Lungs no rales or rhonchi, good excursion bilaterally Heart regular rate and rhythm, no murmur appreciated Abd soft, nontender, positive bowel sounds, no liver or spleen tip palpated on exam, no fluid wave MSK no focal spinal tenderness, no joint edema Neuro: non-focal, well-oriented, appropriate affect Breasts: Has pea sized, mobile nodule at the 8 o'clock position of the right breast that is tender to the touch. No changes found with the left breast.   Lab Results  Component Value Date   WBC 6.1 05/30/2016   HGB 15.4 05/30/2016   HCT 44.1 05/30/2016   MCV 86 05/30/2016   PLT 202 05/30/2016   Lab Results  Component Value Date   FERRITIN 23 05/30/2016   IRON 60 05/30/2016   TIBC 345 05/30/2016   UIBC 285 05/30/2016   IRONPCTSAT 17 (L) 05/30/2016   Lab Results  Component Value Date   RBC 5.13 05/30/2016   No results found for: KPAFRELGTCHN, LAMBDASER, KAPLAMBRATIO No results found for: IGGSERUM, IGA, IGMSERUM No results found for: TOTALPROTELP, ALBUMINELP, A1GS, A2GS, BETS, BETA2SER, GAMS, MSPIKE, SPEI   Chemistry      Component Value Date/Time  NA 141 05/30/2016 1326   K 4.3 05/30/2016 1326   CL 102 09/15/2015 1009   CO2 23 05/30/2016 1326   BUN 19.7 05/30/2016 1326   CREATININE 1.1 05/30/2016 1326      Component Value Date/Time   CALCIUM 9.6 05/30/2016 1326   ALKPHOS 104 05/30/2016 1326   AST 49 (H) 05/30/2016 1326   ALT 83 (H) 05/30/2016 1326   BILITOT 0.70 05/30/2016 1326     Impression and Plan: Randall Turner is a 65 yo gentleman with hemochromatosis - compound heterozygous. His last phlebotomy was in December of 2016. He is symptomatic at this time with increasing fatigue. He feels that he  needs a phlebotomy at this time. His Hgb/Hct is 16.5/46.6.  We will proceed with his phlebotomy today. He does not feel that he needs fluids afterwards.  He did request a flu shot today so we will add that as well.  We will have him get a mammogram next week to evaluate the nodule in his right breast.  We will plan to see him back in 3 months for repeat lab work and follow-up. We will arrange for his labs to be done a couple days before his follow-up at [facility] as this is closer to home for him.  He will contact our office with any questions or concerns. We can certainly see him sooner if need be.   Eliezer Bottom, NP 9/21/20172:06 PM

## 2016-09-13 NOTE — Patient Instructions (Signed)

## 2016-09-14 ENCOUNTER — Other Ambulatory Visit: Payer: Self-pay | Admitting: Family

## 2016-09-14 DIAGNOSIS — N63 Unspecified lump in unspecified breast: Secondary | ICD-10-CM

## 2016-09-14 LAB — IRON AND TIBC
%SAT: 40 % (ref 20–55)
Iron: 139 ug/dL (ref 42–163)
TIBC: 345 ug/dL (ref 202–409)
UIBC: 206 ug/dL (ref 117–376)

## 2016-09-14 LAB — FERRITIN: Ferritin: 38 ng/ml (ref 22–316)

## 2016-09-18 ENCOUNTER — Ambulatory Visit
Admission: RE | Admit: 2016-09-18 | Discharge: 2016-09-18 | Disposition: A | Discharge status: Home / Self Care | Payer: BLUE CROSS/BLUE SHIELD | Source: Ambulatory Visit | Attending: Family | Admitting: Family

## 2016-09-18 DIAGNOSIS — N63 Unspecified lump in unspecified breast: Secondary | ICD-10-CM

## 2016-09-19 ENCOUNTER — Other Ambulatory Visit: Payer: Self-pay | Admitting: Emergency Medicine

## 2016-09-19 ENCOUNTER — Telehealth: Payer: Self-pay | Admitting: Family

## 2016-09-19 ENCOUNTER — Other Ambulatory Visit: Payer: Self-pay | Admitting: Family

## 2016-09-19 ENCOUNTER — Encounter: Payer: Self-pay | Admitting: Family

## 2016-09-19 DIAGNOSIS — I10 Essential (primary) hypertension: Secondary | ICD-10-CM

## 2016-09-19 NOTE — Telephone Encounter (Signed)
Unable to get through to patient.

## 2016-09-25 ENCOUNTER — Telehealth: Payer: Self-pay

## 2016-09-25 DIAGNOSIS — I10 Essential (primary) hypertension: Secondary | ICD-10-CM

## 2016-09-25 MED ORDER — LOSARTAN POTASSIUM 50 MG PO TABS: 50.0000 mg | ORAL_TABLET | Freq: Every day | ORAL | 0 refills | Status: DC

## 2016-09-25 NOTE — Telephone Encounter (Signed)
Patient is needing his losartin, has an appointment with Dr. Carlota Raspberry.   Pharmacy - [facility] on [address].   [phone]

## 2016-09-25 NOTE — Telephone Encounter (Signed)
Sent in Rf and notified pt. 

## 2016-10-04 ENCOUNTER — Encounter: Payer: BLUE CROSS/BLUE SHIELD | Admitting: Family Medicine

## 2016-10-16 ENCOUNTER — Ambulatory Visit (INDEPENDENT_AMBULATORY_CARE_PROVIDER_SITE_OTHER): Payer: BLUE CROSS/BLUE SHIELD | Admitting: Family Medicine

## 2016-10-16 VITALS — BP 130/90 | HR 89 | Temp 97.7°F | Resp 17 | Ht 70.0 in | Wt 247.0 lb

## 2016-10-16 DIAGNOSIS — Z0000 Encounter for general adult medical examination without abnormal findings: Secondary | ICD-10-CM

## 2016-10-16 DIAGNOSIS — K219 Gastro-esophageal reflux disease without esophagitis: Secondary | ICD-10-CM

## 2016-10-16 DIAGNOSIS — R7989 Other specified abnormal findings of blood chemistry: Secondary | ICD-10-CM

## 2016-10-16 DIAGNOSIS — Z114 Encounter for screening for human immunodeficiency virus [HIV]: Secondary | ICD-10-CM

## 2016-10-16 DIAGNOSIS — R945 Abnormal results of liver function studies: Secondary | ICD-10-CM

## 2016-10-16 DIAGNOSIS — I10 Essential (primary) hypertension: Secondary | ICD-10-CM | POA: Diagnosis not present

## 2016-10-16 DIAGNOSIS — E782 Mixed hyperlipidemia: Secondary | ICD-10-CM

## 2016-10-16 DIAGNOSIS — K429 Umbilical hernia without obstruction or gangrene: Secondary | ICD-10-CM

## 2016-10-16 LAB — LIPID PANEL
Cholesterol: 193 mg/dL (ref 125–200)
HDL: 50 mg/dL (ref >=40)
LDL Cholesterol: 95 mg/dL (ref <130)
Total CHOL/HDL Ratio: 3.9 Ratio (ref <=5.0)
Triglycerides: 239 mg/dL — ABNORMAL HIGH (ref <150)
VLDL: 48 mg/dL — ABNORMAL HIGH (ref <30)

## 2016-10-16 MED ORDER — LOSARTAN POTASSIUM 50 MG PO TABS: 50.0000 mg | ORAL_TABLET | Freq: Every day | ORAL | 1 refills | Status: DC

## 2016-10-16 MED ORDER — OMEPRAZOLE 20 MG PO CPDR: DELAYED_RELEASE_CAPSULE | ORAL | 1 refills | Status: DC

## 2016-10-16 NOTE — Progress Notes (Signed)
By signing my name below, I, [name], attest that this documentation has been prepared under the direction and in the presence of [name], MD.  Electronically Signed: [name], Medical Scribe. 10/16/16. 11:21 AM.  Subjective:    Patient ID: Randall Turner, male    DOB: May 23, 1951, 65 y.o.   MRN: [id number]  HPI Chief Complaint  Patient presents with  . Annual Exam    HPI Comments: Skip Turner is a 65 y.o. male who presents to the [facility] for complete physical. He is a new pt to me. Prev pt of [name] and PMHx of HTN, HLD, GERD, and hemochromatosis. Pt is fasting. Probable lipoma on his right breast; planned on repeat US in 6 months.  Hernia: Pt has had umbilical hernia pain 2x in the past 6 months. Pt states the elastic of his underwear or a tight t-shirt also creates pain to his hernia. Pain occurs when he lays on his stomach, but will go away at night and is described as a "tiny 6 inch cord going across" his abdomen. Pt has taken naprosyn for his symptoms. Pt has not gone to a surgeon to fix his hernia. Pt has been careful about picking up heavy objects.  HLD: Does not appear he is on a statin. Lab Results  Component Value Date   CHOL 207 (H) 09/15/2015   HDL 50 09/15/2015   LDLCALC 104 09/15/2015   TRIG 264 (H) 09/15/2015   CHOLHDL 4.1 09/15/2015   Lab Results  Component Value Date   ALT 84 (H) 09/13/2016   AST 51 (H) 09/13/2016   ALKPHOS 127 09/13/2016   BILITOT 1.06 09/13/2016   Hemochromatosis: Was referred to [name]. He had elevated LFTs and hemoglobin when evaluated last year. Last visit with [name] June 7th. He has had phlebotomy 3-4x with decreasing ferritin. Planned for repeat visit in 3 months.  HTN: On losartan 50 mg QD. Pt doesn't check is bp since he has frequent visits for hemachromatosis.  Lab Results  Component Value Date   CREATININE 1.1 09/13/2016   GERD: Treated with omeprazole 20 mg  QD.  Cancer Screening: Colon CA: Colonoscopy in 2014 with Dr. [name], 4 polyps removed, mild diverticulosis. Polyps were tubular adenomas and told to repeat in 3 years. Pt has not got his colonoscopy repeated yet and plans on getting it done next year. Prostate CA:  Lab Results  Component Value Date   PSA 1.97 03/15/2016   PSA 1.69 08/03/2014   PSA 1.90 07/28/2013   Immunizations: Immunization History  Administered Date(s) Administered  . Influenza Split 10/30/2013  . Influenza,inj,Quad PF,36+ Mos 08/03/2014, 09/15/2015, 09/13/2016  . Pneumococcal-Unspecified 11/22/2006  . Tdap 02/18/2007  . Zoster 12/06/2013   Vision: Pt hasn't been to [name] ophthalmologist in the past 2 years and knows he has to setup an appt soon.  Visual Acuity Screening   Right eye Left eye Both eyes  Without correction:     With correction: 20/15 20/30 20/15    Dentist: Pt's next appt will be around Dec 2017  Exercise: Pt hasn't been exercising.  Hep C/HIV Screening: Pt had negative Hep C antibodies in 08/03/2014 and would like to get screened for HIV.  Depression Screening: Pt's wife had breast CA last year and she had a mammogram that had nl results this year. He has been taking assisting her, while maintaining his own health maintenance, but he has been getting through it. Depression screen [name] 2/9 10/16/2016 03/15/2016  01/11/2016 09/15/2015  Decreased Interest 0 0 0 0  Down, Depressed, Hopeless 0 0 0 0  PHQ - 2 Score 0 0 0 0    Patient Active Problem List   Diagnosis Date Noted  . 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 ([place]) 11/30/2015  . Essential hypertension 09/15/2015  . Hyperglycemia 07/28/2013  . Other and unspecified hyperlipidemia 07/28/2013  . Esophageal reflux 07/28/2013   Past Medical History:  Diagnosis Date  . Allergy   . Asthma    childhood asthma  . GERD (gastroesophageal reflux disease)   . Hemochromatosis associated with compound heterozygous  mutation in HFE gene ([place]) 11/30/2015  . Kidney stone    Past Surgical History:  Procedure Laterality Date  . BUNIONECTOMY    . patella removed right knee    . TONSILLECTOMY    . VASECTOMY     Allergies  Allergen Reactions  . Cortisone     swelling   Prior to Admission medications   Medication Sig Start Date End Date Taking? Authorizing Provider  Albuterol Sulfate (PROAIR RESPICLICK) 123XX123 (90 Base) MCG/ACT AEPB Inhale 2 puffs into the lungs every 4 (four) hours as needed. 01/11/16  Yes Shawnee Knapp, MD  aspirin EC 81 MG tablet Take 162 mg by mouth daily.   Yes Historical Provider, MD  cholecalciferol (VITAMIN D) 1000 units tablet Take 1,000 Units by mouth daily.   Yes Historical Provider, MD  losartan (COZAAR) 50 MG tablet Take 1 tablet (50 mg total) by mouth daily. 09/25/16  Yes Wendie Agreste, MD  omeprazole (PRILOSEC) 20 MG capsule TAKE ONE CAPSULE BY MOUTH ONCE DAILY 09/15/15  Yes Darlyne Russian, MD  OVER THE COUNTER MEDICATION Take 1 tablet by mouth. Mens multivitamin without iron   Yes Historical Provider, MD   Social History   Social History  . Marital status: Married    Spouse name: N/A  . Number of children: N/A  . Years of education: N/A   Occupational History  . Not on file.   Social History Main Topics  . Smoking status: Never Smoker  . Smokeless tobacco: Never Used  . Alcohol use 0.0 oz/week     Comment: ocas 2 drinks  . Drug use: No  . Sexual activity: Not on file   Other Topics Concern  . Not on file   Social History Narrative   Married. Education: [employer].    Review of Systems  Constitutional: Positive for activity change.  Gastrointestinal: Positive for abdominal pain.  13 point ROS negtaive besides checked symptoms above.  Objective:  Physical Exam  Constitutional: He is oriented to person, place, and time. He appears well-developed and well-nourished.  HENT:  Head: Normocephalic and atraumatic.  Right Ear: External ear normal.  Left Ear:  External ear normal.  Mouth/Throat: Oropharynx is clear and moist.  Eyes: Conjunctivae and EOM are normal. Pupils are equal, round, and reactive to light.  Neck: Normal range of motion. Neck supple. No thyromegaly present.  Cardiovascular: Normal rate, regular rhythm, normal heart sounds and intact distal pulses.   Pulmonary/Chest: Effort normal and breath sounds normal. No respiratory distress. He has no wheezes.  Abdominal: Soft. He exhibits no distension. There is no tenderness.  Easily reducible umbilical hernia  Genitourinary:  Genitourinary Comments: Exam deferred as performed earlier in the year.   Musculoskeletal: Normal range of motion. He exhibits no edema or tenderness.  Lymphadenopathy:    He has no cervical adenopathy.  Neurological: He is alert and  oriented to person, place, and time. He has normal reflexes.  Skin: Skin is warm and dry.  Psychiatric: He has a normal mood and affect. His behavior is normal.  Vitals reviewed.  BP 130/90 (BP Location: Right Arm, Patient Position: Sitting, Cuff Size: Normal)   Pulse 89   Temp 97.7 F (36.5 C) (Oral)   Resp 17   Ht 5\' 10"  (1.778 m)   Wt 247 lb (112 kg)   SpO2 97%   BMI 35.44 kg/m   Assessment & Plan:   Mikko Teruel is a 65 y.o. male Annual physical exam  --anticipatory guidance as below in AVS, screening labs above. Health maintenance items as above in HPI discussed/recommended as applicable.   Umbilical hernia without obstruction and without gangrene - Plan: Ambulatory referral to General Surgery  - now more symptomatic, but easily reducible. Refer to gen surgery and RTC/ER hernia precautions discussed as well as info on handout.   Hemochromatosis, unspecified hemochromatosis type  - continue follow up with hematology.   Essential hypertension - Plan: losartan (COZAAR) 50 MG tablet  - stable. Continue losartan same dose.   Elevated LFTs  - reportedly improved with treatment of hemochromatosis. Repeat testing.     Encounter for screening for HIV - Plan: HIV antibody  Mixed hyperlipidemia - Plan: Lipid panel  - check lipid panel, lfts, then can decide if statin needed  Gastroesophageal reflux disease, esophagitis presence not specified - Plan: omeprazole (PRILOSEC) 20 MG capsule  - continue omeprazole for now, discuss further next visit to try trigger avoidance with minimal use of PPI if possible.  History of breast lipoma. Plan on 6 month follow-up ultrasound.  Advised to call eye care provider, dentist, and gastroenterologist to schedule colonoscopy.  Meds ordered this encounter  Medications  . cholecalciferol (VITAMIN D) 1000 units tablet    Sig: Take 1,000 Units by mouth daily.  Marland Kitchen losartan (COZAAR) 50 MG tablet    Sig: Take 1 tablet (50 mg total) by mouth daily.    Dispense:  90 tablet    Refill:  1  . omeprazole (PRILOSEC) 20 MG capsule    Sig: TAKE ONE CAPSULE BY MOUTH ONCE DAILY    Dispense:  90 capsule    Refill:  1   Patient Instructions   See precautions for hernias below.  I would recommend meeting with general surgeon, and will refer you to their office for evaluation. Return to the clinic or go to the nearest emergency room if any of your symptoms worsen or new symptoms occur.  Call Dr. Vena Rua office regarding your repeat colonoscopy as it appears you are due based on prior notes.   You are up to date on prostate cancer screening for now.   Call eye care provider to schedule follow up appointment.   Schedule dentist visit when you can as well.   Plan for follow-up in 6 months, and you will be due for a repeat ultrasound of the right breast lipoma at that time.  If you have any questions from today's visit, let me know.  Return to the clinic or go to the nearest emergency room if any of your symptoms worsen or new symptoms occur.   Keeping you healthy  Get these tests  Blood pressure- Have your blood pressure checked once a year by your healthcare provider.   Normal blood pressure is 120/80  Weight- Have your body mass index (BMI) calculated to screen for obesity.  BMI is a measure of body fat  based on height and weight. You can also calculate your own BMI at [email or web address].[email or web address].  Cholesterol- Have your cholesterol checked every year.  Diabetes- Have your blood sugar checked regularly if you have high blood pressure, high cholesterol, have a family history of diabetes or if you are overweight.  Screening for Colon Cancer- Colonoscopy starting at age 52.  Screening may begin sooner depending on your family history and other health conditions. Follow up colonoscopy as directed by your Gastroenterologist.  Screening for Prostate Cancer- Both blood work (PSA) and a rectal exam help screen for Prostate Cancer.  Screening begins at age 38 with African-American men and at age 76 with Caucasian men.  Screening may begin sooner depending on your family history.  Take these medicines  Aspirin- One aspirin daily can help prevent Heart disease and Stroke.  Flu shot- Every fall.  Tetanus- Every 10 years.  Zostavax- Once after the age of 42 to prevent Shingles.  Pneumonia shot- Once after the age of 20; if you are younger than 41, ask your healthcare provider if you need a Pneumonia shot.  Take these steps  Don't smoke- If you do smoke, talk to your doctor about quitting.  For tips on how to quit, go to www.smokefree.gov or call 1-800-QUIT-NOW.  Be physically active- Exercise 5 days a week for at least 30 minutes.  If you are not already physically active start slow and gradually work up to 30 minutes of moderate physical activity.  Examples of moderate activity include walking briskly, mowing the yard, dancing, swimming, bicycling, etc.  Eat a healthy diet- Eat a variety of healthy food such as fruits, vegetables, low fat milk, low fat cheese, yogurt, lean meant, poultry, fish, beans, tofu, etc. For more information go to  www.thenutritionsource.org  Drink alcohol in moderation- Limit alcohol intake to less than two drinks a day. Never drink and drive.  Dentist- Brush and floss twice daily; visit your dentist twice a year.  Depression- Your emotional health is as important as your physical health. If you're feeling down, or losing interest in things you would normally enjoy please talk to your healthcare provider.  Eye exam- Visit your eye doctor every year.  Safe sex- If you may be exposed to a sexually transmitted infection, use a condom.  Seat belts- Seat belts can save your life; always wear one.  Smoke/Carbon Monoxide detectors- These detectors need to be installed on the appropriate level of your home.  Replace batteries at least once a year.  Skin cancer- When out in the sun, cover up and use sunscreen 15 SPF or higher.  Violence- If anyone is threatening you, please tell your healthcare provider.  Living Will/ Health care power of attorney- Speak with your healthcare provider and family.  Hernia, Adult A hernia is the bulging of an organ or tissue through a weak spot in the muscles of the abdomen (abdominal wall). Hernias develop most often near the navel or groin. There are many kinds of hernias. Common kinds include:  Femoral hernia. This kind of hernia develops under the groin in the upper thigh area.  Inguinal hernia. This kind of hernia develops in the groin or scrotum.  Umbilical hernia. This kind of hernia develops near the navel.  Hiatal hernia. This kind of hernia causes part of the stomach to be pushed up into the chest.  Incisional hernia. This kind of hernia bulges through a scar from an abdominal surgery. CAUSES This condition may be caused by:  Heavy lifting.  Coughing over a long period of time.  Straining to have a bowel movement.  An incision made during an abdominal surgery.  A birth defect (congenital defect).  Excess weight or obesity.  Smoking.  Poor  nutrition.  Cystic fibrosis.  Excess fluid in the abdomen.  Undescended testicles. SYMPTOMS Symptoms of a hernia include:  A lump on the abdomen. This is the first sign of a hernia. The lump may become more obvious with standing, straining, or coughing. It may get bigger over time if it is not treated or if the condition causing it is not treated.  Pain. A hernia is usually painless, but it may become painful over time if treatment is delayed. The pain is usually dull and may get worse with standing or lifting heavy objects. Sometimes a hernia gets tightly squeezed in the weak spot (strangulated) or stuck there (incarcerated) and causes additional symptoms. These symptoms may include:  Vomiting.  Nausea.  Constipation.  Irritability. DIAGNOSIS A hernia may be diagnosed with:  A physical exam. During the exam your health care provider may ask you to cough or to make a specific movement, because a hernia is usually more visible when you move.  Imaging tests. These can include:  X-rays.  Ultrasound.  CT scan. TREATMENT A hernia that is small and painless may not need to be treated. A hernia that is large or painful may be treated with surgery. Inguinal hernias may be treated with surgery to prevent incarceration or strangulation. Strangulated hernias are always treated with surgery, because lack of blood to the trapped organ or tissue can cause it to die. Surgery to treat a hernia involves pushing the bulge back into place and repairing the weak part of the abdomen. HOME CARE INSTRUCTIONS  Avoid straining.  Do not lift anything heavier than 10 lb (4.5 kg).  Lift with your leg muscles, not your back muscles. This helps avoid strain.  When coughing, try to cough gently.  Prevent constipation. Constipation leads to straining with bowel movements, which can make a hernia worse or cause a hernia repair to break down. You can prevent constipation by:  Eating a high-fiber diet  that includes plenty of fruits and vegetables.  Drinking enough fluids to keep your urine clear or pale yellow. Aim to drink 6-8 glasses of water per day.  Using a stool softener as directed by your health care provider.  Lose weight, if you are overweight.  Do not use any tobacco products, including cigarettes, chewing tobacco, or electronic cigarettes. If you need help quitting, ask your health care provider.  Keep all follow-up visits as directed by your health care provider. This is important. Your health care provider may need to monitor your condition. SEEK MEDICAL CARE IF:  You have swelling, redness, and pain in the affected area.  Your bowel habits change. SEEK IMMEDIATE MEDICAL CARE IF:  You have a fever.  You have abdominal pain that is getting worse.  You feel nauseous or you vomit.  You cannot push the hernia back in place by gently pressing on it while you are lying down.  The hernia:  Changes in shape or size.  Is stuck outside the abdomen.  Becomes discolored.  Feels hard or tender.   This information is not intended to replace advice given to you by your health care provider. Make sure you discuss any questions you have with your health care provider.   Document Released: 12/10/2005 Document Revised: 12/31/2014 Document Reviewed: 10/20/2014 Elsevier Interactive Patient  Education 2016 [employer].    IF you received an x-ray today, you will receive an invoice from [facility]. Please contact [facility] at [phone] with questions or concerns regarding your invoice.   IF you received labwork today, you will receive an invoice from [employer]. Please contact [facility] at [phone] with questions or concerns regarding your invoice.   Our billing staff will not be able to assist you with questions regarding bills from these companies.  You will be contacted with the lab results as soon as they are  available. The fastest way to get your results is to activate your My Chart account. Instructions are located on the last page of this paperwork. If you have not heard from [place] regarding the results in 2 weeks, please contact this office.        I personally performed the services described in this documentation, which was scribed in my presence. The recorded information has been reviewed and considered, and addended by me as needed.   Signed,   [name], MD [facility].  10/18/16 8:27 AM

## 2016-10-16 NOTE — Patient Instructions (Addendum)
See precautions for hernias below.  I would recommend meeting with general surgeon, and will refer you to their office for evaluation. Return to the clinic or go to the nearest emergency room if any of your symptoms worsen or new symptoms occur.  Call Dr. Vena Rua office regarding your repeat colonoscopy as it appears you are due based on prior notes.   You are up to date on prostate cancer screening for now.   Call eye care provider to schedule follow up appointment.   Schedule dentist visit when you can as well.   Plan for follow-up in 6 months, and you will be due for a repeat ultrasound of the right breast lipoma at that time.  If you have any questions from today's visit, let me know.  Return to the clinic or go to the nearest emergency room if any of your symptoms worsen or new symptoms occur.   Keeping you healthy  Get these tests  Blood pressure- Have your blood pressure checked once a year by your healthcare provider.  Normal blood pressure is 120/80  Weight- Have your body mass index (BMI) calculated to screen for obesity.  BMI is a measure of body fat based on height and weight. You can also calculate your own BMI at [employer].  Cholesterol- Have your cholesterol checked every year.  Diabetes- Have your blood sugar checked regularly if you have high blood pressure, high cholesterol, have a family history of diabetes or if you are overweight.  Screening for Colon Cancer- Colonoscopy starting at age 73.  Screening may begin sooner depending on your family history and other health conditions. Follow up colonoscopy as directed by your Gastroenterologist.  Screening for Prostate Cancer- Both blood work (PSA) and a rectal exam help screen for Prostate Cancer.  Screening begins at age 23 with African-American men and at age 35 with Caucasian men.  Screening may begin sooner depending on your family history.  Take these medicines  Aspirin- One aspirin daily can  help prevent Heart disease and Stroke.  Flu shot- Every fall.  Tetanus- Every 10 years.  Zostavax- Once after the age of 43 to prevent Shingles.  Pneumonia shot- Once after the age of 65; if you are younger than 23, ask your healthcare provider if you need a Pneumonia shot.  Take these steps  Don't smoke- If you do smoke, talk to your doctor about quitting.  For tips on how to quit, go to www.smokefree.gov or call 1-800-QUIT-NOW.  Be physically active- Exercise 5 days a week for at least 30 minutes.  If you are not already physically active start slow and gradually work up to 30 minutes of moderate physical activity.  Examples of moderate activity include walking briskly, mowing the yard, dancing, swimming, bicycling, etc.  Eat a healthy diet- Eat a variety of healthy food such as fruits, vegetables, low fat milk, low fat cheese, yogurt, lean meant, poultry, fish, beans, tofu, etc. For more information go to www.thenutritionsource.org  Drink alcohol in moderation- Limit alcohol intake to less than two drinks a day. Never drink and drive.  Dentist- Brush and floss twice daily; visit your dentist twice a year.  Depression- Your emotional health is as important as your physical health. If you're feeling down, or losing interest in things you would normally enjoy please talk to your healthcare provider.  Eye exam- Visit your eye doctor every year.  Safe sex- If you may be exposed to a sexually transmitted infection, use a condom.  Seat belts-  Seat belts can save your life; always wear one.  Smoke/Carbon Monoxide detectors- These detectors need to be installed on the appropriate level of your home.  Replace batteries at least once a year.  Skin cancer- When out in the sun, cover up and use sunscreen 15 SPF or higher.  Violence- If anyone is threatening you, please tell your healthcare provider.  Living Will/ Health care power of attorney- Speak with your healthcare provider and  family.  Hernia, Adult A hernia is the bulging of an organ or tissue through a weak spot in the muscles of the abdomen (abdominal wall). Hernias develop most often near the navel or groin. There are many kinds of hernias. Common kinds include:  Femoral hernia. This kind of hernia develops under the groin in the upper thigh area.  Inguinal hernia. This kind of hernia develops in the groin or scrotum.  Umbilical hernia. This kind of hernia develops near the navel.  Hiatal hernia. This kind of hernia causes part of the stomach to be pushed up into the chest.  Incisional hernia. This kind of hernia bulges through a scar from an abdominal surgery. CAUSES This condition may be caused by:  Heavy lifting.  Coughing over a long period of time.  Straining to have a bowel movement.  An incision made during an abdominal surgery.  A birth defect (congenital defect).  Excess weight or obesity.  Smoking.  Poor nutrition.  Cystic fibrosis.  Excess fluid in the abdomen.  Undescended testicles. SYMPTOMS Symptoms of a hernia include:  A lump on the abdomen. This is the first sign of a hernia. The lump may become more obvious with standing, straining, or coughing. It may get bigger over time if it is not treated or if the condition causing it is not treated.  Pain. A hernia is usually painless, but it may become painful over time if treatment is delayed. The pain is usually dull and may get worse with standing or lifting heavy objects. Sometimes a hernia gets tightly squeezed in the weak spot (strangulated) or stuck there (incarcerated) and causes additional symptoms. These symptoms may include:  Vomiting.  Nausea.  Constipation.  Irritability. DIAGNOSIS A hernia may be diagnosed with:  A physical exam. During the exam your health care provider may ask you to cough or to make a specific movement, because a hernia is usually more visible when you move.  Imaging tests. These can  include:  X-rays.  Ultrasound.  CT scan. TREATMENT A hernia that is small and painless may not need to be treated. A hernia that is large or painful may be treated with surgery. Inguinal hernias may be treated with surgery to prevent incarceration or strangulation. Strangulated hernias are always treated with surgery, because lack of blood to the trapped organ or tissue can cause it to die. Surgery to treat a hernia involves pushing the bulge back into place and repairing the weak part of the abdomen. HOME CARE INSTRUCTIONS  Avoid straining.  Do not lift anything heavier than 10 lb (4.5 kg).  Lift with your leg muscles, not your back muscles. This helps avoid strain.  When coughing, try to cough gently.  Prevent constipation. Constipation leads to straining with bowel movements, which can make a hernia worse or cause a hernia repair to break down. You can prevent constipation by:  Eating a high-fiber diet that includes plenty of fruits and vegetables.  Drinking enough fluids to keep your urine clear or pale yellow. Aim to drink 6-8  glasses of water per day.  Using a stool softener as directed by your health care provider.  Lose weight, if you are overweight.  Do not use any tobacco products, including cigarettes, chewing tobacco, or electronic cigarettes. If you need help quitting, ask your health care provider.  Keep all follow-up visits as directed by your health care provider. This is important. Your health care provider may need to monitor your condition. SEEK MEDICAL CARE IF:  You have swelling, redness, and pain in the affected area.  Your bowel habits change. SEEK IMMEDIATE MEDICAL CARE IF:  You have a fever.  You have abdominal pain that is getting worse.  You feel nauseous or you vomit.  You cannot push the hernia back in place by gently pressing on it while you are lying down.  The hernia:  Changes in shape or size.  Is stuck outside the  abdomen.  Becomes discolored.  Feels hard or tender.   This information is not intended to replace advice given to you by your health care provider. Make sure you discuss any questions you have with your health care provider.   Document Released: 12/10/2005 Document Revised: 12/31/2014 Document Reviewed: 10/20/2014 Elsevier Interactive Patient Education 2016 [employer].    IF you received an x-ray today, you will receive an invoice from [facility]. Please contact [facility] at [phone] with questions or concerns regarding your invoice.   IF you received labwork today, you will receive an invoice from [employer]. Please contact [facility] at [phone] with questions or concerns regarding your invoice.   Our billing staff will not be able to assist you with questions regarding bills from these companies.  You will be contacted with the lab results as soon as they are available. The fastest way to get your results is to activate your My Chart account. Instructions are located on the last page of this paperwork. If you have not heard from [place] regarding the results in 2 weeks, please contact this office.

## 2016-10-17 LAB — HIV ANTIBODY (ROUTINE TESTING W REFLEX): HIV 1&2 Ab, 4th Generation: NONREACTIVE

## 2016-10-18 ENCOUNTER — Encounter: Payer: BLUE CROSS/BLUE SHIELD | Admitting: Family Medicine

## 2016-10-29 ENCOUNTER — Ambulatory Visit: Payer: Self-pay | Admitting: General Surgery

## 2016-12-05 ENCOUNTER — Encounter: Payer: Self-pay | Admitting: Internal Medicine

## 2016-12-11 ENCOUNTER — Other Ambulatory Visit (HOSPITAL_BASED_OUTPATIENT_CLINIC_OR_DEPARTMENT_OTHER): Payer: BLUE CROSS/BLUE SHIELD

## 2016-12-11 LAB — CBC WITH DIFFERENTIAL/PLATELET
BASO%: 0.9 % (ref 0.0–2.0)
Basophils Absolute: 0.1 10*3/uL (ref 0.0–0.1)
EOS%: 3.6 % (ref 0.0–7.0)
Eosinophils Absolute: 0.3 10*3/uL (ref 0.0–0.5)
HCT: 47.1 % (ref 38.4–49.9)
HGB: 15.7 g/dL (ref 13.0–17.1)
LYMPH%: 25 % (ref 14.0–49.0)
MCH: 28 pg (ref 27.2–33.4)
MCHC: 33.3 g/dL (ref 32.0–36.0)
MCV: 84.2 fL (ref 79.3–98.0)
MONO#: 0.8 10*3/uL (ref 0.1–0.9)
MONO%: 11.2 % (ref 0.0–14.0)
NEUT#: 4.4 10*3/uL (ref 1.5–6.5)
NEUT%: 59.3 % (ref 39.0–75.0)
Platelets: 266 10*3/uL (ref 140–400)
RBC: 5.6 10*6/uL (ref 4.20–5.82)
RDW: 13.8 % (ref 11.0–14.6)
WBC: 7.3 10*3/uL (ref 4.0–10.3)
lymph#: 1.8 10*3/uL (ref 0.9–3.3)

## 2016-12-11 LAB — COMPREHENSIVE METABOLIC PANEL
ALT: 82 U/L — ABNORMAL HIGH (ref 0–55)
AST: 49 U/L — ABNORMAL HIGH (ref 5–34)
Albumin: 4.1 g/dL (ref 3.5–5.0)
Alkaline Phosphatase: 128 U/L (ref 40–150)
Anion Gap: 10 mEq/L (ref 3–11)
BUN: 16.9 mg/dL (ref 7.0–26.0)
CO2: 20 mEq/L — ABNORMAL LOW (ref 22–29)
Calcium: 9.7 mg/dL (ref 8.4–10.4)
Chloride: 110 mEq/L — ABNORMAL HIGH (ref 98–109)
Creatinine: 1.2 mg/dL (ref 0.7–1.3)
EGFR: 67 mL/min/{1.73_m2} — ABNORMAL LOW (ref >90)
Glucose: 122 mg/dl (ref 70–140)
Potassium: 4.2 mEq/L (ref 3.5–5.1)
Sodium: 140 mEq/L (ref 136–145)
Total Bilirubin: 0.78 mg/dL (ref 0.20–1.20)
Total Protein: 7.4 g/dL (ref 6.4–8.3)

## 2016-12-11 LAB — IRON AND TIBC
%SAT: 18 % — ABNORMAL LOW (ref 20–55)
Iron: 76 ug/dL (ref 42–163)
TIBC: 420 ug/dL — ABNORMAL HIGH (ref 202–409)
UIBC: 344 ug/dL (ref 117–376)

## 2016-12-11 LAB — FERRITIN: Ferritin: 18 ng/ml — ABNORMAL LOW (ref 22–316)

## 2016-12-13 ENCOUNTER — Ambulatory Visit (HOSPITAL_BASED_OUTPATIENT_CLINIC_OR_DEPARTMENT_OTHER): Payer: BLUE CROSS/BLUE SHIELD | Admitting: Hematology & Oncology

## 2016-12-13 ENCOUNTER — Other Ambulatory Visit: Payer: BLUE CROSS/BLUE SHIELD

## 2016-12-13 NOTE — Progress Notes (Signed)
Hematology and Oncology Follow Up Visit  Randall Turner [id number] 1951/06/05 65 y.o. 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 is here today for follow-up. He is doing pretty well. We last saw him back in September.  He had lab work done 2 days ago. The lab work showed his ferritin B 18. His iron saturation was only 18%. As such, we do not have to phlebotomize him.  He's had no trouble with work. He has been traveling. He went to [place] with his wife for things giving. He will be staying local for Christmas.  He did have a mammogram done back in September. When he was last here, Randall Turner found a nodule in the right breast area. This was felt to be a lipoma.  He has had a little bit of a rash on his dorsal hand. This mostly is on the left hand. He does put some lotion on.  He's had no chest pain. He's had no cough or shortness of breath. He's had no nausea or vomiting. His been no change in bowel or bladder habits.  Overall, his performance status is ECOG 0.    Medications:  Allergies as of 12/13/2016      Reactions   Cortisone    swelling      Medication List       Accurate as of 12/13/16  5:21 PM. Always use your most recent med list.          Albuterol Sulfate 108 (90 Base) MCG/ACT Aepb Commonly known as:  PROAIR RESPICLICK Inhale 2 puffs into the lungs every 4 (four) hours as needed.   aspirin EC 81 MG tablet Take 162 mg by mouth daily.   cholecalciferol 1000 units tablet Commonly known as:  VITAMIN D Take 1,000 Units by mouth daily.   losartan 50 MG tablet Commonly known as:  COZAAR Take 1 tablet (50 mg total) by mouth daily.   omeprazole 20 MG capsule Commonly known as:  PRILOSEC TAKE ONE CAPSULE BY MOUTH ONCE DAILY   OVER THE COUNTER MEDICATION Take 1 tablet by mouth. Mens multivitamin without iron       Allergies:  Allergies  Allergen Reactions  . Cortisone     swelling    Past Medical History, Surgical history, Social history, and Family History were reviewed and updated.  Review of Systems: All other 10 point review of systems is negative.   Physical Exam:  weight is 249 lb (112.9 kg). His oral temperature is 97.6 F (36.4 C). His blood pressure is 155/90 (abnormal) and his pulse is 100. His respiration is 16.   Wt Readings from Last 3 Encounters:  12/13/16 249 lb (112.9 kg)  10/16/16 247 lb (112 kg)  09/13/16 245 lb 12.8 oz (111.5 kg)    Ocular: Sclerae unicteric, pupils equal, round and reactive to light Ear-nose-throat: Oropharynx clear, dentition fair Lymphatic: No cervical supraclavicular or axillary adenopathy Lungs no rales or rhonchi, good excursion bilaterally Heart regular rate and rhythm, no murmur appreciated Abd soft, nontender, positive bowel sounds, no liver or spleen tip palpated on exam, no fluid wave MSK no focal spinal tenderness, no joint edema Neuro: non-focal, well-oriented, appropriate affect   Lab Results  Component Value Date   WBC 7.3 12/11/2016   HGB 15.7 12/11/2016   HCT 47.1 12/11/2016   MCV 84.2 12/11/2016   PLT  266 12/11/2016   Lab Results  Component Value Date   FERRITIN 18 (L) 12/11/2016   IRON 76 12/11/2016   TIBC 420 (H) 12/11/2016   UIBC 344 12/11/2016   IRONPCTSAT 18 (L) 12/11/2016   Lab Results  Component Value Date   RBC 5.60 12/11/2016   No results found for: KPAFRELGTCHN, LAMBDASER, KAPLAMBRATIO No results found for: IGGSERUM, IGA, IGMSERUM No results found for: Randall Turner, A1GS, A2GS, Randall Turner, MSPIKE, SPEI   Chemistry      Component Value Date/Time   NA 140 12/11/2016 1352   K 4.2 12/11/2016 1352   CL 102 09/15/2015 1009   CO2 20 (L) 12/11/2016 1352   BUN 16.9 12/11/2016 1352   CREATININE 1.2 12/11/2016 1352      Component Value Date/Time   CALCIUM 9.7 12/11/2016 1352    ALKPHOS 128 12/11/2016 1352   AST 49 (H) 12/11/2016 1352   ALT 82 (H) 12/11/2016 1352   BILITOT 0.78 12/11/2016 1352     Impression and Plan: [name] is a 65 yo gentleman with hemochromatosis - compound heterozygous. His last phlebotomy was in December of 2016.  From my point of view, everything looks quite good. We do not have to phlebotomize him.  He would love to come back in 6 months. He wants get his blood work done a couple days before he gets here. That way, we will have the results back.   I told him that he was comeback sooner if he feels that there are any issues.  Volanda Napoleon, MD 12/21/20175:21 PM

## 2017-01-15 ENCOUNTER — Ambulatory Visit (AMBULATORY_SURGERY_CENTER): Payer: Self-pay | Admitting: *Deleted

## 2017-01-15 VITALS — Ht 71.0 in | Wt 251.0 lb

## 2017-01-15 DIAGNOSIS — Z86010 Personal history of colonic polyps: Secondary | ICD-10-CM

## 2017-01-15 MED ORDER — NA SULFATE-K SULFATE-MG SULF 17.5-3.13-1.6 GM/177ML PO SOLN: ORAL | 0 refills | Status: DC

## 2017-01-15 NOTE — Progress Notes (Signed)
No allergies to eggs or soy. No problems with anesthesia.  Pt given Emmi instructions for colonoscopy  No oxygen use  No diet drug use  

## 2017-01-16 ENCOUNTER — Encounter: Payer: Self-pay | Admitting: Internal Medicine

## 2017-01-23 ENCOUNTER — Encounter: Payer: Self-pay | Admitting: Internal Medicine

## 2017-01-23 ENCOUNTER — Ambulatory Visit (AMBULATORY_SURGERY_CENTER): Payer: BLUE CROSS/BLUE SHIELD | Admitting: Internal Medicine

## 2017-01-23 VITALS — BP 129/76 | HR 64 | Temp 97.1°F | Resp 11 | Ht 71.0 in | Wt 251.0 lb

## 2017-01-23 DIAGNOSIS — Z86010 Personal history of colonic polyps: Secondary | ICD-10-CM | POA: Diagnosis not present

## 2017-01-23 DIAGNOSIS — D122 Benign neoplasm of ascending colon: Secondary | ICD-10-CM

## 2017-01-23 MED ORDER — SODIUM CHLORIDE 0.9 % IV SOLN
500.0000 mL | INTRAVENOUS | Status: DC
Start: 2017-01-23 — End: 2017-08-09

## 2017-01-23 NOTE — Progress Notes (Signed)
Called to room to assist during endoscopic procedure.  Patient ID and intended procedure confirmed with present staff. Received instructions for my participation in the procedure from the performing physician.  

## 2017-01-23 NOTE — Patient Instructions (Signed)
Discharge instructions given. Handouts on polyps and hemorrhoids. Resume previous medications. YOU HAD AN ENDOSCOPIC PROCEDURE TODAY AT THE [facility]:   Refer to the procedure report that was given to you for any specific questions about what was found during the examination.  If the procedure report does not answer your questions, please call your gastroenterologist to clarify.  If you requested that your care partner not be given the details of your procedure findings, then the procedure report has been included in a sealed envelope for you to review at your convenience later.  YOU SHOULD EXPECT: Some feelings of bloating in the abdomen. Passage of more gas than usual.  Walking can help get rid of the air that was put into your GI tract during the procedure and reduce the bloating. If you had a lower endoscopy (such as a colonoscopy or flexible sigmoidoscopy) you may notice spotting of blood in your stool or on the toilet paper. If you underwent a bowel prep for your procedure, you may not have a normal bowel movement for a few days.  Please Note:  You might notice some irritation and congestion in your nose or some drainage.  This is from the oxygen used during your procedure.  There is no need for concern and it should clear up in a day or so.  SYMPTOMS TO REPORT IMMEDIATELY:   Following lower endoscopy (colonoscopy or flexible sigmoidoscopy):  Excessive amounts of blood in the stool  Significant tenderness or worsening of abdominal pains  Swelling of the abdomen that is new, acute  Fever of 100F or higher   For urgent or emergent issues, a gastroenterologist can be reached at any hour by calling [phone].   DIET:  We do recommend a small meal at first, but then you may proceed to your regular diet.  Drink plenty of fluids but you should avoid alcoholic beverages for 24 hours.  ACTIVITY:  You should plan to take it easy for the rest of today and you should NOT DRIVE  or use heavy machinery until tomorrow (because of the sedation medicines used during the test).    FOLLOW UP: Our staff will call the number listed on your records the next business day following your procedure to check on you and address any questions or concerns that you may have regarding the information given to you following your procedure. If we do not reach you, we will leave a message.  However, if you are feeling well and you are not experiencing any problems, there is no need to return our call.  We will assume that you have returned to your regular daily activities without incident.  If any biopsies were taken you will be contacted by phone or by letter within the next 1-3 weeks.  Please call us at [phone] if you have not heard about the biopsies in 3 weeks.    SIGNATURES/CONFIDENTIALITY: You and/or your care partner have signed paperwork which will be entered into your electronic medical record.  These signatures attest to the fact that that the information above on your After Visit Summary has been reviewed and is understood.  Full responsibility of the confidentiality of this discharge information lies with you and/or your care-partner. 

## 2017-01-23 NOTE — Op Note (Signed)
[place] Patient Name: Randall Turner Procedure Date: 01/23/2017 1:34 PM MRN: [id number] Endoscopist: Jerene Bears , MD Age: 66 Referring MD:  Date of Birth: 1951-04-21 Gender: Male Account #: [id number] Procedure:                Colonoscopy Indications:              Surveillance: Personal history of adenomatous                            polyps on last colonoscopy 3 years ago Medicines:                Monitored Anesthesia Care Procedure:                Pre-Anesthesia Assessment:                           - Prior to the procedure, a History and Physical                            was performed, and patient medications and                            allergies were reviewed. The patient's tolerance of                            previous anesthesia was also reviewed. The risks                            and benefits of the procedure and the sedation                            options and risks were discussed with the patient.                            All questions were answered, and informed consent                            was obtained. Prior Anticoagulants: The patient has                            taken no previous anticoagulant or antiplatelet                            agents. ASA Grade Assessment: II - A patient with                            mild systemic disease. After reviewing the risks                            and benefits, the patient was deemed in                            satisfactory condition to undergo the procedure.  After obtaining informed consent, the colonoscope                            was passed under direct vision. Throughout the                            procedure, the patient's blood pressure, pulse, and                            oxygen saturations were monitored continuously. The                            Model CF-HQ190L [id number]) scope was introduced                            through the anus and  advanced to the the cecum,                            identified by appendiceal orifice and ileocecal                            valve. The colonoscopy was performed without                            difficulty. The patient tolerated the procedure                            well. The quality of the bowel preparation was                            good. The ileocecal valve, appendiceal orifice, and                            rectum were photographed. The bowel preparation                            used was 2 day Suprep. Scope In: 1:41:34 PM Scope Out: 2:04:13 PM Scope Withdrawal Time: 0 hours 19 minutes 26 seconds  Total Procedure Duration: 0 hours 22 minutes 39 seconds  Findings:                 The digital rectal exam was normal.                           Two sessile polyps were found in the ascending                            colon. The polyps were 3 to 6 mm in size. These                            polyps were removed with a cold snare. Resection                            and retrieval were complete.  There were 3 small lipomas, 6 to 12 mm in diameter,                            in the recto-sigmoid colon (1) and in the sigmoid                            colon (2). 2 of these were observed at last                            colonoscopy in 2014 and unchanged.                           Internal hemorrhoids were found during                            retroflexion. The hemorrhoids were small. Complications:            No immediate complications. Estimated Blood Loss:     Estimated blood loss was minimal. Impression:               - Two 3 to 6 mm polyps in the ascending colon,                            removed with a cold snare. Resected and retrieved.                           - Small lipomas in the recto-sigmoid colon and in                            the sigmoid colon.                           - Internal hemorrhoids. Recommendation:           - Patient  has a contact number available for                            emergencies. The signs and symptoms of potential                            delayed complications were discussed with the                            patient. Return to normal activities tomorrow.                            Written discharge instructions were provided to the                            patient.                           - Resume previous diet.                           - Continue  present medications.                           - Await pathology results.                           - Repeat colonoscopy is recommended for                            surveillance. The colonoscopy date will be                            determined after pathology results from today's                            exam become available for review. Jerene Bears, MD 01/23/2017 2:15:15 PM This report has been signed electronically.

## 2017-01-23 NOTE — Progress Notes (Signed)
Spontaneous respirations throughout. VSS. Resting comfortably. To PACU on room air. Report to  Celia RN. 

## 2017-01-24 ENCOUNTER — Telehealth: Payer: Self-pay

## 2017-01-24 NOTE — Telephone Encounter (Signed)
  Follow up Call-  Call back number 01/23/2017  Post procedure Call Back phone  # [phone]  Permission to leave phone message Yes  Some recent data might be hidden     Patient questions:  Do you have a fever, pain , or abdominal swelling? No. Pain Score  0 *  Have you tolerated food without any problems? Yes.    Have you been able to return to your normal activities? Yes.    Do you have any questions about your discharge instructions: Diet   No. Medications  No. Follow up visit  No.  Do you have questions or concerns about your Care? No.  Actions: * If pain score is 4 or above: No action needed, pain <4.

## 2017-01-24 NOTE — Telephone Encounter (Signed)
  Follow up Call-  Call back number 01/23/2017  Post procedure Call Back phone  # [phone]  Permission to leave phone message Yes  Some recent data might be hidden    Patient was called for follow up after his procedure on 01/23/2017. No answer at the number given for follow up ph one call. A message was left on the answering machine.

## 2017-02-01 ENCOUNTER — Encounter: Payer: Self-pay | Admitting: Internal Medicine

## 2017-03-14 ENCOUNTER — Telehealth: Payer: Self-pay | Admitting: Family Medicine

## 2017-03-14 NOTE — Telephone Encounter (Signed)
LMOM FOR PT TO CALL BACK TO RESCHEDULE APPOINTMENT THAT HE HAD WITH [facility] ON 04-18-17 SHE WILL NOT BE IN OFFICE THAT DAY

## 2017-04-10 ENCOUNTER — Other Ambulatory Visit: Payer: Self-pay | Admitting: Family Medicine

## 2017-04-10 DIAGNOSIS — I10 Essential (primary) hypertension: Secondary | ICD-10-CM

## 2017-04-18 ENCOUNTER — Ambulatory Visit: Payer: BLUE CROSS/BLUE SHIELD | Admitting: Family Medicine

## 2017-04-25 ENCOUNTER — Other Ambulatory Visit: Payer: Self-pay | Admitting: Family Medicine

## 2017-04-25 DIAGNOSIS — K219 Gastro-esophageal reflux disease without esophagitis: Secondary | ICD-10-CM

## 2017-04-26 MED ORDER — OMEPRAZOLE 20 MG PO CPDR: DELAYED_RELEASE_CAPSULE | ORAL | 0 refills | Status: DC

## 2017-04-26 NOTE — Telephone Encounter (Signed)
Pt calling stating that he has an appointment with Carlota Raspberry on the 24th and was denied a refill on his Omeprazole 20 mg he would like a 90 day supply instead of 30 we had to reschedule his appointment due to provider not being in the office that day so he rescheduled

## 2017-05-10 ENCOUNTER — Other Ambulatory Visit: Payer: Self-pay | Admitting: Family Medicine

## 2017-05-10 DIAGNOSIS — I10 Essential (primary) hypertension: Secondary | ICD-10-CM

## 2017-05-16 ENCOUNTER — Ambulatory Visit (INDEPENDENT_AMBULATORY_CARE_PROVIDER_SITE_OTHER): Payer: BLUE CROSS/BLUE SHIELD | Admitting: Family Medicine

## 2017-05-16 ENCOUNTER — Encounter: Payer: Self-pay | Admitting: Family Medicine

## 2017-05-16 VITALS — BP 136/92 | HR 91 | Temp 98.2°F | Resp 18 | Ht 71.0 in | Wt 252.4 lb

## 2017-05-16 DIAGNOSIS — R1310 Dysphagia, unspecified: Secondary | ICD-10-CM | POA: Diagnosis not present

## 2017-05-16 DIAGNOSIS — E782 Mixed hyperlipidemia: Secondary | ICD-10-CM | POA: Diagnosis not present

## 2017-05-16 DIAGNOSIS — I10 Essential (primary) hypertension: Secondary | ICD-10-CM | POA: Diagnosis not present

## 2017-05-16 DIAGNOSIS — K219 Gastro-esophageal reflux disease without esophagitis: Secondary | ICD-10-CM | POA: Diagnosis not present

## 2017-05-16 DIAGNOSIS — Z23 Encounter for immunization: Secondary | ICD-10-CM

## 2017-05-16 MED ORDER — OMEPRAZOLE 20 MG PO CPDR: DELAYED_RELEASE_CAPSULE | ORAL | 3 refills | Status: DC

## 2017-05-16 MED ORDER — LOSARTAN POTASSIUM 50 MG PO TABS: 50.0000 mg | ORAL_TABLET | Freq: Every day | ORAL | 1 refills | Status: DC

## 2017-05-16 NOTE — Progress Notes (Signed)
Subjective:  By signing my name below, I, Randall Turner, attest that this documentation has been prepared under the direction and in the presence of [name], MD. Electronically Signed: [name], [place]. 05/16/2017 , 9:58 AM .  Patient was seen in Room 25 .   Patient ID: Randall Turner, male    DOB: 10-08-1951, 66 y.o.   MRN: [id number] Chief Complaint  Patient presents with  . Follow-up   HPI Randall Turner is a 66 y.o. male Here for 6 month follow up of HTN. He was last seen for his physical in Oct 2017. He is fasting this morning.   HTN Lab Results  Component Value Date   CREATININE 1.2 12/11/2016   He is on losartan 50mg  QD. He was overall controlled at previous visit in Oct. His BP was 139/90 at previous visit.   Patient has been checking BP at home, running 137/67 this past week. 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 He took a shower and checked his BP afterwards, at 112/66. He denies seeing his BP over 140/90 at home. He denies complications with his BP medication.   He's cautious about climbing and working on his roof. He notes his roof isn't a high pitch.   HLD Lab Results  Component Value Date   CHOL 193 10/16/2016   HDL 50 10/16/2016   LDLCALC 95 10/16/2016   TRIG 239 (H) 10/16/2016   CHOLHDL 3.9 10/16/2016   His 10 year ASCVD risk was 10.9%; plan for diet and exercise as initial approach recheck in 3-6 months. Then consider statin if still elevated.   Wt Readings from Last 3 Encounters:  01/23/17 251 lb (113.9 kg)  01/15/17 251 lb (113.9 kg)  12/13/16 249 lb (112.9 kg)   He reports his diet has been about the same. He recalls that his father was on cholesterol medication. His father also had Alzheimer's, and heart issues; passed away at at 65 years old.   Hemochromatosis He is followed by [name]; last seen in Dec, plan for recheck in 6 months. His last phlebotomy was in Dec 2016. His next appointment  will be in June 21st.   Esophageal reflux He takes omeprazole 20mg  QD. He was placed on it to help reduce the amount of acid in his stomach due to hiatal hernia, instructed by GI. He's been taking it for over 10 years. He had colonoscopy done in Jan 2018 by Dr. [name], repeat in 5 years.   He also mentions having occasional food being stuck if he eats too fast. He would eat and slow down his food intake. This has been ongoing for past few years. His last endoscopy was over 10 years ago; nothing new recently. He sleeps with his upper body slightly elevated to help with this.   Tetanus shot Requested updating his tetanus today. His last tdap was done Feb 2008. We will update this today.   Patient Active Problem List   Diagnosis Date Noted  . 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 Essential hypertension 09/15/2015  . Hyperglycemia 07/28/2013  . Other and unspecified hyperlipidemia 07/28/2013  . Esophageal reflux 07/28/2013   Past Medical History:  Diagnosis Date  . Allergy   . Asthma    childhood asthma  . GERD (gastroesophageal reflux disease)   . 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 Hypertension   . Kidney stone  Past Surgical History:  Procedure Laterality Date  . BUNIONECTOMY Bilateral 2005  . patella removed right knee  1969  . TONSILLECTOMY  1958  . VASECTOMY     Allergies  Allergen Reactions  . Cortisone     swelling   Prior to Admission medications   Medication Sig Start Date End Date Taking? Authorizing Provider  Albuterol Sulfate (PROAIR RESPICLICK) 599 (90 Base) MCG/ACT AEPB Inhale 2 puffs into the lungs every 4 (four) hours as needed. 01/11/16   Shawnee Knapp, MD  aspirin EC 81 MG tablet Take 162 mg by mouth daily.    [provider]  cholecalciferol (VITAMIN D) 1000 units tablet Take 1,000 Units by mouth daily.    [provider]    losartan (COZAAR) 50 MG tablet TAKE 1 TABLET BY MOUTH ONCE DAILY 05/11/17   Wendie Agreste, MD  omeprazole (PRILOSEC) 20 MG capsule TAKE ONE CAPSULE BY MOUTH ONCE DAILY 04/26/17   Wendie Agreste, MD  OVER THE COUNTER MEDICATION Take 1 tablet by mouth. Mens multivitamin without iron    [provider]   Social History   Social History  . Marital status: Married    Spouse name: N/A  . Number of children: N/A  . Years of education: N/A   Occupational History  . Not on file.   Social History Main Topics  . Smoking status: Never Smoker  . Smokeless tobacco: Never Used  . Alcohol use No  . Drug use: No  . Sexual activity: Not on file   Other Topics Concern  . Not on file   Social History Narrative   Married. Education: [employer].    Review of Systems  Constitutional: Negative for fatigue and unexpected weight change.  Eyes: Negative for visual disturbance.  Respiratory: Negative for cough, chest tightness and shortness of breath.   Cardiovascular: Negative for chest pain, palpitations and leg swelling.  Gastrointestinal: Negative for abdominal pain and Turner in stool.  Neurological: Negative for dizziness, light-headedness and headaches.       Objective:   Physical Exam  Constitutional: He is oriented to person, place, and time. He appears well-developed and well-nourished.  HENT:  Head: Normocephalic and atraumatic.  Eyes: EOM are normal. Pupils are equal, round, and reactive to light.  Neck: No JVD present. Carotid bruit is not present.  Cardiovascular: Normal rate, regular rhythm and normal heart sounds.   No murmur heard. Pulmonary/Chest: Effort normal and breath sounds normal. He has no rales.  Musculoskeletal: He exhibits no edema.  Neurological: He is alert and oriented to person, place, and time.  Skin: Skin is warm and dry.  Psychiatric: He has a normal mood and affect.  Vitals reviewed.   Vitals:   05/16/17 0925 05/16/17 1003  BP: (!) 142/90  (!) 136/92  Pulse: 91   Resp: 18   Temp: 98.2 F (36.8 C)   TempSrc: Oral   SpO2: 94%   Weight: 252 lb 6.4 oz (114.5 kg)   Height: 5\' 11"  (1.803 m)       Assessment & Plan:   Randall Turner is a 66 y.o. male Mixed hyperlipidemia - Plan: Comprehensive metabolic panel, Lipid panel  -Check lipid panel, then can determine if meds indicated.  Essential hypertension - Plan: Comprehensive metabolic panel, losartan (COZAAR) 50 MG tablet  -Borderline elevated in office, home readings are much lower. No change in meds at this time. Labs pending. Continue losartan 50 mg daily  Gastroesophageal reflux disease, esophagitis presence not specified -  Plan: omeprazole (PRILOSEC) 20 MG capsule, Vitamin B12, Magnesium, Ambulatory referral to Gastroenterology,  Dysphagia, unspecified type - Plan: Ambulatory referral to Gastroenterology  -Check B12, magnesium with chronic PPI use. Continue omeprazole 20 mg daily. We'll refer to GI to discuss further evaluation of dysphasia symptoms.  Need for Tdap vaccination - Plan: Tdap vaccine greater than or equal to 7yo IM given.   Meds ordered this encounter  Medications  . losartan (COZAAR) 50 MG tablet    Sig: Take 1 tablet (50 mg total) by mouth daily.    Dispense:  90 tablet    Refill:  1  . omeprazole (PRILOSEC) 20 MG capsule    Sig: TAKE ONE CAPSULE BY MOUTH ONCE DAILY    Dispense:  90 capsule    Refill:  3   Patient Instructions    Keep a record of your Turner pressures outside of the office and if over 140/90, may need to change meds. Otherwise same dose for now. Make sure to drink plenty of fluids during heat or work outside.    If cholesterol still elevated, would recommend a statin medication.   I will check some Turner tests with use of your omeprazole, but continue omeprazole once per day. I will refer you to gastroenterology to discuss the symptoms with swallowing food.   Discuss with surgeon regarding your hernia. Tetanus was updated  today.  Recheck in 6 months  IF you received an x-ray today, you will receive an invoice from [facility]. Please contact [facility] at [phone] with questions or concerns regarding your invoice.   IF you received labwork today, you will receive an invoice from [facility]. Please contact [facility] at [phone] with questions or concerns regarding your invoice.   Our billing staff will not be able to assist you with questions regarding bills from these companies.  You will be contacted with the lab results as soon as they are available. The fastest way to get your results is to activate your My Chart account. Instructions are located on the last page of this paperwork. If you have not heard from [place] regarding the results in 2 weeks, please contact this office.      I personally performed the services described in this documentation, which was scribed in my presence. The recorded information has been reviewed and considered for accuracy and completeness, addended by me as needed, and agree with information above.  Signed,   [name], MD [facility] [place].  05/16/17 6:24 PM

## 2017-05-16 NOTE — Patient Instructions (Addendum)
  Keep a record of your blood pressures outside of the office and if over 140/90, may need to change meds. Otherwise same dose for now. Make sure to drink plenty of fluids during heat or work outside.    If cholesterol still elevated, would recommend a statin medication.   I will check some blood tests with use of your omeprazole, but continue omeprazole once per day. I will refer you to gastroenterology to discuss the symptoms with swallowing food.   Discuss with surgeon regarding your hernia. Tetanus was updated today.  Recheck in 6 months  IF you received an x-ray today, you will receive an invoice from [facility]. Please contact [facility] at [phone] with questions or concerns regarding your invoice.   IF you received labwork today, you will receive an invoice from [facility]. Please contact [facility] at [phone] with questions or concerns regarding your invoice.   Our billing staff will not be able to assist you with questions regarding bills from these companies.  You will be contacted with the lab results as soon as they are available. The fastest way to get your results is to activate your My Chart account. Instructions are located on the last page of this paperwork. If you have not heard from [place] regarding the results in 2 weeks, please contact this office.

## 2017-05-17 LAB — COMPREHENSIVE METABOLIC PANEL
ALT: 64 IU/L — ABNORMAL HIGH (ref 0–44)
AST: 43 IU/L — ABNORMAL HIGH (ref 0–40)
Albumin/Globulin Ratio: 2.1 (ref 1.2–2.2)
Albumin: 4.6 g/dL (ref 3.6–4.8)
Alkaline Phosphatase: 122 IU/L — ABNORMAL HIGH (ref 39–117)
BUN/Creatinine Ratio: 16 (ref 10–24)
BUN: 19 mg/dL (ref 8–27)
Bilirubin Total: 0.9 mg/dL (ref 0.0–1.2)
CO2: 20 mmol/L (ref 18–29)
Calcium: 9.5 mg/dL (ref 8.6–10.2)
Chloride: 103 mmol/L (ref 96–106)
Creatinine, Ser: 1.19 mg/dL (ref 0.76–1.27)
GFR calc Af Amer: 74 mL/min/{1.73_m2} (ref >59)
GFR calc non Af Amer: 64 mL/min/{1.73_m2} (ref >59)
Globulin, Total: 2.2 g/dL (ref 1.5–4.5)
Glucose: 111 mg/dL — ABNORMAL HIGH (ref 65–99)
Potassium: 4.8 mmol/L (ref 3.5–5.2)
Sodium: 140 mmol/L (ref 134–144)
Total Protein: 6.8 g/dL (ref 6.0–8.5)

## 2017-05-17 LAB — LIPID PANEL
Chol/HDL Ratio: 4.1 ratio (ref 0.0–5.0)
Cholesterol, Total: 189 mg/dL (ref 100–199)
HDL: 46 mg/dL (ref >39)
LDL Calculated: 99 mg/dL (ref 0–99)
Triglycerides: 222 mg/dL — ABNORMAL HIGH (ref 0–149)
VLDL Cholesterol Cal: 44 mg/dL — ABNORMAL HIGH (ref 5–40)

## 2017-05-17 LAB — VITAMIN B12: Vitamin B-12: 879 pg/mL (ref 232–1245)

## 2017-05-17 LAB — MAGNESIUM: Magnesium: 2 mg/dL (ref 1.6–2.3)

## 2017-05-27 ENCOUNTER — Encounter: Payer: Self-pay | Admitting: Family Medicine

## 2017-05-29 MED ORDER — PRAVASTATIN SODIUM 20 MG PO TABS: 20.0000 mg | ORAL_TABLET | Freq: Every day | ORAL | 0 refills | Status: DC

## 2017-06-07 ENCOUNTER — Encounter: Payer: Self-pay | Admitting: *Deleted

## 2017-06-11 ENCOUNTER — Other Ambulatory Visit (HOSPITAL_BASED_OUTPATIENT_CLINIC_OR_DEPARTMENT_OTHER): Payer: BLUE CROSS/BLUE SHIELD

## 2017-06-11 LAB — COMPREHENSIVE METABOLIC PANEL
ALT: 53 U/L (ref 0–55)
AST: 32 U/L (ref 5–34)
Albumin: 4.2 g/dL (ref 3.5–5.0)
Alkaline Phosphatase: 122 U/L (ref 40–150)
Anion Gap: 9 mEq/L (ref 3–11)
BUN: 18.8 mg/dL (ref 7.0–26.0)
CO2: 25 mEq/L (ref 22–29)
Calcium: 10.1 mg/dL (ref 8.4–10.4)
Chloride: 107 mEq/L (ref 98–109)
Creatinine: 1.2 mg/dL (ref 0.7–1.3)
EGFR: 67 mL/min/{1.73_m2} — ABNORMAL LOW (ref >90)
Glucose: 96 mg/dl (ref 70–140)
Potassium: 4.3 mEq/L (ref 3.5–5.1)
Sodium: 141 mEq/L (ref 136–145)
Total Bilirubin: 0.89 mg/dL (ref 0.20–1.20)
Total Protein: 7 g/dL (ref 6.4–8.3)

## 2017-06-11 LAB — CBC WITH DIFFERENTIAL/PLATELET
BASO%: 0.8 % (ref 0.0–2.0)
Basophils Absolute: 0.1 10*3/uL (ref 0.0–0.1)
EOS%: 3.5 % (ref 0.0–7.0)
Eosinophils Absolute: 0.3 10*3/uL (ref 0.0–0.5)
HCT: 46.8 % (ref 38.4–49.9)
HGB: 15.9 g/dL (ref 13.0–17.1)
LYMPH%: 24.8 % (ref 14.0–49.0)
MCH: 29 pg (ref 27.2–33.4)
MCHC: 34.1 g/dL (ref 32.0–36.0)
MCV: 85.1 fL (ref 79.3–98.0)
MONO#: 0.9 10*3/uL (ref 0.1–0.9)
MONO%: 12.7 % (ref 0.0–14.0)
NEUT#: 4.2 10*3/uL (ref 1.5–6.5)
NEUT%: 58.2 % (ref 39.0–75.0)
Platelets: 231 10*3/uL (ref 140–400)
RBC: 5.5 10*6/uL (ref 4.20–5.82)
RDW: 14.6 % (ref 11.0–14.6)
WBC: 7.2 10*3/uL (ref 4.0–10.3)
lymph#: 1.8 10*3/uL (ref 0.9–3.3)

## 2017-06-11 LAB — IRON AND TIBC
%SAT: 25 % (ref 20–55)
Iron: 99 ug/dL (ref 42–163)
TIBC: 395 ug/dL (ref 202–409)
UIBC: 296 ug/dL (ref 117–376)

## 2017-06-11 LAB — FERRITIN: Ferritin: 20 ng/ml — ABNORMAL LOW (ref 22–316)

## 2017-06-12 ENCOUNTER — Telehealth: Payer: Self-pay | Admitting: *Deleted

## 2017-06-12 ENCOUNTER — Encounter: Payer: Self-pay | Admitting: *Deleted

## 2017-06-12 NOTE — Telephone Encounter (Addendum)
Patient's phone blocking incoming call from office. Will send echart message  ----- Message from Volanda Napoleon, MD sent at 06/12/2017  7:18 AM EDT ----- Call - iron level is perfect!!!  NO phlebootmy!!  pete

## 2017-06-13 ENCOUNTER — Ambulatory Visit (HOSPITAL_BASED_OUTPATIENT_CLINIC_OR_DEPARTMENT_OTHER): Payer: BLUE CROSS/BLUE SHIELD | Admitting: Hematology & Oncology

## 2017-06-13 NOTE — Progress Notes (Signed)
Hematology and Oncology Follow Up Visit  Yancy Knoble [id number] 1951-05-11 66 y.o. 06/13/2017   Principle Diagnosis:  Hemochromatosis - compound heterozygote for C282Y and H63D  Current Therapy:   Phlebotomy to maintain ferritin less than 100    Interim History:  Mr. Loeper is here today for follow-up. He is doing pretty well. We last saw him back in December.  He had lab work done 2 days ago. The lab work showed his ferritin of 20. His iron saturation was 30%.  He is now retired. He is enjoying retirement.  He is not planning to travel anywhere over the summertime.  He did have a nice Father's Day weekend.  He's had no chest pain. He's had no cough or shortness of breath. He's had no nausea or vomiting. His been no change in bowel or bladder habits.  Overall, his performance status is ECOG 0.    Medications:  Allergies as of 06/13/2017      Reactions   Cortisone    swelling      Medication List       Accurate as of 06/13/17  3:43 PM. Always use your most recent med list.          Albuterol Sulfate 108 (90 Base) MCG/ACT Aepb Commonly known as:  PROAIR RESPICLICK Inhale 2 puffs into the lungs every 4 (four) hours as needed.   aspirin EC 81 MG tablet Take 162 mg by mouth daily.   cholecalciferol 1000 units tablet Commonly known as:  VITAMIN D Take 1,000 Units by mouth daily.   losartan 50 MG tablet Commonly known as:  COZAAR Take 1 tablet (50 mg total) by mouth daily.   omeprazole 20 MG capsule Commonly known as:  PRILOSEC TAKE ONE CAPSULE BY MOUTH ONCE DAILY   OVER THE COUNTER MEDICATION Take 1 tablet by mouth. Mens multivitamin without iron   pravastatin 20 MG tablet Commonly known as:  PRAVACHOL Take 1 tablet (20 mg total) by mouth daily.       Allergies:  Allergies  Allergen Reactions  . Cortisone     swelling    Past Medical History, Surgical history, Social history, and Family History were reviewed and updated.  Review of  Systems: All other 10 point review of systems is negative.   Physical Exam:  weight is 251 lb (113.9 kg). His oral temperature is 98.1 F (36.7 C). His blood pressure is 135/93 (abnormal) and his pulse is 109 (abnormal). His respiration is 18 and oxygen saturation is 96%.   Wt Readings from Last 3 Encounters:  06/13/17 251 lb (113.9 kg)  05/16/17 252 lb 6.4 oz (114.5 kg)  01/23/17 251 lb (113.9 kg)    Ocular: Sclerae unicteric, pupils equal, round and reactive to light Ear-nose-throat: Oropharynx clear, dentition fair Lymphatic: No cervical supraclavicular or axillary adenopathy Lungs no rales or rhonchi, good excursion bilaterally Heart regular rate and rhythm, no murmur appreciated Abd soft, nontender, positive bowel sounds, no liver or spleen tip palpated on exam, no fluid wave MSK no focal spinal tenderness, no joint edema Neuro: non-focal, well-oriented, appropriate affect   Lab Results  Component Value Date   WBC 7.2 06/11/2017   HGB 15.9 06/11/2017   HCT 46.8 06/11/2017   MCV 85.1 06/11/2017   PLT 231 06/11/2017   Lab Results  Component Value Date   FERRITIN 20 (L) 06/11/2017   IRON 99 06/11/2017   TIBC 395 06/11/2017   UIBC 296 06/11/2017   IRONPCTSAT 25 06/11/2017   Lab Results  Component Value Date   RBC 5.50 06/11/2017   No results found for: KPAFRELGTCHN, LAMBDASER, KAPLAMBRATIO No results found for: IGGSERUM, IGA, IGMSERUM No results found for: Odetta Pink, SPEI   Chemistry      Component Value Date/Time   NA 141 06/11/2017 1401   K 4.3 06/11/2017 1401   CL 103 05/16/2017 1040   CO2 25 06/11/2017 1401   BUN 18.8 06/11/2017 1401   CREATININE 1.2 06/11/2017 1401      Component Value Date/Time   CALCIUM 10.1 06/11/2017 1401   ALKPHOS 122 06/11/2017 1401   AST 32 06/11/2017 1401   ALT 53 06/11/2017 1401   BILITOT 0.89 06/11/2017 1401     Impression and Plan: Mr. Zoeller is a 66 yo  gentleman with hemochromatosis - compound heterozygous. His last phlebotomy was in December of 2016.  From my point of view, everything looks quite good. We do not have to phlebotomize him.  I think we can see him back yearly now. I do not see anything that suggests that the hemochromatosis will be a problem.   Volanda Napoleon, MD 6/21/20183:43 PM

## 2017-06-17 ENCOUNTER — Ambulatory Visit (INDEPENDENT_AMBULATORY_CARE_PROVIDER_SITE_OTHER): Payer: BLUE CROSS/BLUE SHIELD | Admitting: Internal Medicine

## 2017-06-17 ENCOUNTER — Encounter: Payer: Self-pay | Admitting: Internal Medicine

## 2017-06-17 VITALS — BP 126/80 | HR 74 | Ht 71.0 in | Wt 258.0 lb

## 2017-06-17 DIAGNOSIS — K219 Gastro-esophageal reflux disease without esophagitis: Secondary | ICD-10-CM | POA: Diagnosis not present

## 2017-06-17 DIAGNOSIS — K449 Diaphragmatic hernia without obstruction or gangrene: Secondary | ICD-10-CM

## 2017-06-17 DIAGNOSIS — Z79899 Other long term (current) drug therapy: Secondary | ICD-10-CM

## 2017-06-17 DIAGNOSIS — Z5181 Encounter for therapeutic drug level monitoring: Secondary | ICD-10-CM | POA: Diagnosis not present

## 2017-06-17 MED ORDER — OMEPRAZOLE 20 MG PO CPDR: DELAYED_RELEASE_CAPSULE | ORAL | 3 refills | Status: DC

## 2017-06-17 NOTE — Patient Instructions (Signed)
We have sent the following medications to your pharmacy for you to pick up at your convenience: omeprazole 20 mg once daily  Please follow up with Dr [name] in 1 year.  We will contact you once we have gotten an appointment with Dr Yong Channel.  If you are age 65 or older, your body mass index should be between 23-30. Your Body mass index is 35.98 kg/m. If this is out of the aforementioned range listed, please consider follow up with your Primary Care Provider.  If you are age 61 or younger, your body mass index should be between 19-25. Your Body mass index is 35.98 kg/m. If this is out of the aformentioned range listed, please consider follow up with your Primary Care Provider.

## 2017-06-17 NOTE — Progress Notes (Signed)
Patient ID: Randall Turner, male   DOB: 1951-11-20, 66 y.o.   MRN: [id number] HPI: Randall Turner is a 66 year old male with a past medical history of GERD, hiatal hernia, colon polyps, hemochromatosis with history of prior phlebotomy followed by hematology, hypertension, hyperlipidemia and kidney stones who seen in consult at the request of Dr. Carlota Raspberry to discuss reflux and chronic PPI use. He is here alone today. He is known to me from colonoscopy which was last performed in January 2018. This revealed 2 polyps 3-6 mm in size removed with cold snare. There were 3 small lipomas seen in the colon. Internal hemorrhoids were found on retroflexion. The polyps removed were tubular adenomas without high-grade dysplasia.  He reports a long-standing history of reflux which he reports is well controlled on omeprazole 20 mg daily. He recalls 8-9 years ago having had a barium esophagram and upper endoscopy. He reports the upper endoscopy was unremarkable other than the hiatal hernia. At that time he was having food catching in his upper chest and neck area. This is been a persistent and stable symptom for 8 or 9 years. He feels that omeprazole which he takes daily helps improve this symptom. He will have regurgitation without heartburn if he is too late at night or overeats. He does sleep on 2 pillows. He tries to chew his food well and take small bites. Occasionally if he is quickly food feels like it sticks in the mid neck. This has not changed her been progressive. No weight loss. No abdominal pain. No change in bowel habits, blood in his stool or melena.  He denies a history of osteoporosis, bone fracture, and chronic kidney disease. He denies family history of GI tract malignancy.  Past Medical History:  Diagnosis Date  . Allergy   . Asthma    childhood asthma  . GERD (gastroesophageal reflux disease)   . 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  Hyperlipidemia   . Hypertension   . Internal hemorrhoids   . Kidney stone   . Tubular adenoma of colon     Past Surgical History:  Procedure Laterality Date  . BUNIONECTOMY Bilateral 2005  . patella removed right knee  1969  . TONSILLECTOMY  1958  . VASECTOMY      Outpatient Medications Prior to Visit  Medication Sig Dispense Refill  . Albuterol Sulfate (PROAIR RESPICLICK) 163 (90 Base) MCG/ACT AEPB Inhale 2 puffs into the lungs every 4 (four) hours as needed. 1 each 0  . aspirin EC 81 MG tablet Take 162 mg by mouth daily.    . cholecalciferol (VITAMIN D) 1000 units tablet Take 1,000 Units by mouth daily.    Marland Kitchen losartan (COZAAR) 50 MG tablet Take 1 tablet (50 mg total) by mouth daily. 90 tablet 1  . OVER THE COUNTER MEDICATION Take 1 tablet by mouth. Mens multivitamin without iron    . pravastatin (PRAVACHOL) 20 MG tablet Take 1 tablet (20 mg total) by mouth daily. 90 tablet 0  . omeprazole (PRILOSEC) 20 MG capsule TAKE ONE CAPSULE BY MOUTH ONCE DAILY 90 capsule 3   Facility-Administered Medications Prior to Visit  Medication Dose Route Frequency Provider Last Rate Last Dose  . 0.9 %  sodium chloride infusion  500 mL Intravenous Continuous Pyrtle, Lajuan Lines, MD        Allergies  Allergen Reactions  . Cortisone     swelling    Family History  Problem Relation Age of Onset  .  Cancer Mother   . Gallstones Mother   . Cancer Father   . Heart disease Maternal Grandmother   . Cancer Maternal Grandfather   . Heart disease Paternal Grandmother   . Colon cancer Neg Hx   . Pancreatic cancer Neg Hx   . Stomach cancer Neg Hx     Social History  Substance Use Topics  . Smoking status: Never Smoker  . Smokeless tobacco: Never Used  . Alcohol use No    ROS: As per history of present illness, otherwise negative  BP 126/80   Pulse 74   Ht 5\' 11"  (1.803 m)   Wt 258 lb (117 kg)   BMI 35.98 kg/m  Constitutional: Well-developed and well-nourished. No distress. HEENT:  Normocephalic and atraumatic. Oropharynx is clear and moist. Conjunctivae are normal.  No scleral icterus. Neck: Neck supple. Trachea midline. Cardiovascular: Normal rate, regular rhythm and intact distal pulses. No M/R/G Pulmonary/chest: Effort normal and breath sounds normal. No wheezing, rales or rhonchi. Abdominal: Soft, nontender, nondistended. Bowel sounds active throughout. There are no masses palpable. No hepatosplenomegaly.Obese, small umbilical hernia without tenderness Extremities: no clubbing, cyanosis, or edema Neurological: Alert and oriented to person place and time. Skin: Skin is warm and dry.  Psychiatric: Normal mood and affect. Behavior is normal.  RELEVANT LABS AND IMAGING: CBC    Component Value Date/Time   WBC 7.2 06/11/2017 1401   WBC 6.5 09/15/2015 1009   RBC 5.50 06/11/2017 1401   RBC 5.38 09/15/2015 1009   HGB 15.9 06/11/2017 1401   HCT 46.8 06/11/2017 1401   PLT 231 06/11/2017 1401   MCV 85.1 06/11/2017 1401   MCH 29.0 06/11/2017 1401   MCH 30.4 09/13/2016 1354   MCH 32.7 09/15/2015 1009   MCHC 34.1 06/11/2017 1401   MCHC 36.4 (H) 09/15/2015 1009   RDW 14.6 06/11/2017 1401   LYMPHSABS 1.8 06/11/2017 1401   MONOABS 0.9 06/11/2017 1401   EOSABS 0.3 06/11/2017 1401   EOSABS 0.3 09/13/2016 1354   BASOSABS 0.1 06/11/2017 1401    CMP     Component Value Date/Time   NA 141 06/11/2017 1401   K 4.3 06/11/2017 1401   CL 103 05/16/2017 1040   CO2 25 06/11/2017 1401   GLUCOSE 96 06/11/2017 1401   BUN 18.8 06/11/2017 1401   CREATININE 1.2 06/11/2017 1401   CALCIUM 10.1 06/11/2017 1401   PROT 7.0 06/11/2017 1401   ALBUMIN 4.2 06/11/2017 1401   AST 32 06/11/2017 1401   ALT 53 06/11/2017 1401   ALKPHOS 122 06/11/2017 1401   BILITOT 0.89 06/11/2017 1401   GFRNONAA 64 05/16/2017 1040   GFRNONAA 63 09/15/2015 1009   GFRAA 74 05/16/2017 1040   GFRAA 72 09/15/2015 1009    ASSESSMENT/PLAN:  66 year old male with a past medical history of GERD, hiatal  hernia, colon polyps, hemochromatosis with history of prior phlebotomy followed by hematology, hypertension, hyperlipidemia and kidney stones who seen in consult at the request of Dr. Carlota Raspberry to discuss reflux and chronic PPI use.  1. GERD/history of hiatal hernia/dysphagia -- patient reports GERD symptoms as stable and well-controlled on omeprazole 20 mg daily. We discussed his dysphagia which is not progressive and intermittent. He reports prior barium esophagram and upper endoscopy is unremarkable. We discussed repeating upper endoscopy for possible dilation and to exclude Barrett's esophagus but he does not wish to proceed at this time. He feels that since his symptoms are stable he does not want to have this evaluated further with endoscopy at this  time. I recommended that if symptoms change that he notify me immediately. He voices understanding. We did discuss chronic PPI use including the risk benefits and alternatives. I informed him that on further review PPIs are not related to dementia. They can be related to osteoporosis, bone fracture, C. difficile infection (of which he does not have a history), and be used with caution and chronic advanced kidney disease. After this discussion he wishes to continue this medication which I feel is very reasonable. Continue omeprazole 20 mg once daily. Annual follow-up, sooner if necessary    PH:XTAVWP, Ranell Patrick, [address] [place], [place] [id number]

## 2017-06-18 ENCOUNTER — Telehealth: Payer: Self-pay | Admitting: Internal Medicine

## 2017-06-18 NOTE — Telephone Encounter (Signed)
Noted  

## 2017-08-09 ENCOUNTER — Encounter: Payer: Self-pay | Admitting: Family Medicine

## 2017-08-09 ENCOUNTER — Ambulatory Visit (INDEPENDENT_AMBULATORY_CARE_PROVIDER_SITE_OTHER): Payer: BLUE CROSS/BLUE SHIELD | Admitting: Family Medicine

## 2017-08-09 DIAGNOSIS — K219 Gastro-esophageal reflux disease without esophagitis: Secondary | ICD-10-CM

## 2017-08-09 DIAGNOSIS — I10 Essential (primary) hypertension: Secondary | ICD-10-CM | POA: Diagnosis not present

## 2017-08-09 DIAGNOSIS — J4520 Mild intermittent asthma, uncomplicated: Secondary | ICD-10-CM

## 2017-08-09 DIAGNOSIS — J45909 Unspecified asthma, uncomplicated: Secondary | ICD-10-CM | POA: Insufficient documentation

## 2017-08-09 DIAGNOSIS — R739 Hyperglycemia, unspecified: Secondary | ICD-10-CM | POA: Diagnosis not present

## 2017-08-09 DIAGNOSIS — Z86010 Personal history of colonic polyps: Secondary | ICD-10-CM | POA: Diagnosis not present

## 2017-08-09 DIAGNOSIS — Z860101 Personal history of adenomatous and serrated colon polyps: Secondary | ICD-10-CM

## 2017-08-09 DIAGNOSIS — E785 Hyperlipidemia, unspecified: Secondary | ICD-10-CM

## 2017-08-09 MED ORDER — ALBUTEROL SULFATE 108 (90 BASE) MCG/ACT IN AEPB: 2.0000 | INHALATION_SPRAY | RESPIRATORY_TRACT | 3 refills | Status: DC | PRN

## 2017-08-09 MED ORDER — LOSARTAN POTASSIUM 50 MG PO TABS: 50.0000 mg | ORAL_TABLET | Freq: Every day | ORAL | 3 refills | Status: DC

## 2017-08-09 MED ORDER — PRAVASTATIN SODIUM 20 MG PO TABS: 20.0000 mg | ORAL_TABLET | Freq: Every day | ORAL | 3 refills | Status: DC

## 2017-08-09 NOTE — Patient Instructions (Addendum)
10/17/17 or later for physical . Would love to see you lose perhaps 5 lbs by that visit. Come in fasting. Will be at new location  Refilled meds outside prilosec  ______________________________________________________________________  Starting October 1st 2018, I will be transferring to our new location:  I would love to have you remain my patient at this new location as long as it remains convenient for you. I am excited about the opportunity to have x-ray and sports medicine in the new building but will really miss the awesome staff and physicians at [facility]. Continue to schedule appointments at [facility] and we will automatically transfer them to the [place] location starting October 1st.

## 2017-08-09 NOTE — Assessment & Plan Note (Addendum)
S: suspect reasonably controlled on pravastatin 20mg - started due to triglycerides over 200. No myalgias.  Lab Results  Component Value Date   CHOL 189 05/16/2017   HDL 46 05/16/2017   LDLCALC 99 05/16/2017   TRIG 222 (H) 05/16/2017   CHOLHDL 4.1 05/16/2017   A/P: LDL at goal <100 but would prefer triglycerides under 200- started on statin as a result . Weight up 10 lbs over last 18 months- I encouraged him to reverse this trend in hopes it will help his triglycerides.  Repeat lipids at CPE  He blames R knee for lack of exercise but he has a pool and recumbent bike and encouraged him to work around this.

## 2017-08-09 NOTE — Assessment & Plan Note (Signed)
S: controlled on losartan 50mg .  BP Readings from Last 3 Encounters:  08/09/17 116/82  06/17/17 126/80  06/13/17 (!) 135/93  A/P: We discussed blood pressure goal of <140/90. Continue current meds

## 2017-08-09 NOTE — Assessment & Plan Note (Addendum)
S: prn albuterol- rare use with animal exposure- otherwise pretty much grew out of this from childhood A/P: continue current medications

## 2017-08-09 NOTE — Assessment & Plan Note (Signed)
S: GERD Complicated by Schatzki's ring. Omeprazole 20mg . Sees Dr. [name] A/P:continue current rx

## 2017-08-09 NOTE — Progress Notes (Signed)
Phone: [phone]  Subjective:  Patient presents today to establish care.  Prior patient of [place] Dr. Everlene Turner. Chief complaint-noted.   See problem oriented charting  The following were reviewed and entered/updated in epic: Past Medical History:  Diagnosis Date  . Allergy   . Asthma    childhood asthma  . GERD (gastroesophageal reflux disease)   . 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 Hyperlipidemia   . Hypertension   . Internal hemorrhoids   . Nephrolithiasis    lithotripsy  . Tubular adenoma of colon    Patient Active Problem List   Diagnosis Date Noted  . 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 Asthma 08/09/2017    Priority: Medium  . Essential hypertension 09/15/2015    Priority: Medium  . Hyperglycemia 07/28/2013    Priority: Medium  . Hyperlipidemia 07/28/2013    Priority: Medium  . Esophageal reflux 07/28/2013    Priority: Medium  . History of adenomatous polyp of colon 08/09/2017    Priority: Low   Past Surgical History:  Procedure Laterality Date  . BUNIONECTOMY Bilateral 2005  . patella removed right knee  1969   after football injury. plan was to grind down back of it but it was broken  . TONSILLECTOMY  1958  . VASECTOMY      Family History  Problem Relation Age of Onset  . Gallstones Mother   . Colon cancer Mother   . Lung cancer Mother        discovered near death  . Heart disease Mother        rhythm and mitral valve prolapse  . Alzheimer's disease Father   . Prostate cancer Father        radiation when younger- survived  . Mesothelioma Father   . Heart disease Maternal Grandmother        died at young age from heart issue unkown  . Cancer Maternal Grandfather   . Heart disease Paternal Grandmother        enlarged heart  . Heart attack Paternal Grandfather        age 34  . Nephrolithiasis Sister   .  Diabetes Maternal Aunt   . Pancreatic cancer Neg Hx   . Stomach cancer Neg Hx     Medications- reviewed and updated Current Outpatient Prescriptions  Medication Sig Dispense Refill  . Albuterol Sulfate (PROAIR RESPICLICK) 378 (90 Base) MCG/ACT AEPB Inhale 2 puffs into the lungs every 4 (four) hours as needed. 1 each 0  . aspirin EC 81 MG tablet Take 162 mg by mouth daily.    . cholecalciferol (VITAMIN D) 1000 units tablet Take 1,000 Units by mouth daily.    Marland Kitchen losartan (COZAAR) 50 MG tablet Take 1 tablet (50 mg total) by mouth daily. 90 tablet 1  . omeprazole (PRILOSEC) 20 MG capsule TAKE ONE CAPSULE BY MOUTH ONCE DAILY 90 capsule 3  . OVER THE COUNTER MEDICATION Take 1 tablet by mouth. Mens multivitamin without iron    . pravastatin (PRAVACHOL) 20 MG tablet Take 1 tablet (20 mg total) by mouth daily. 90 tablet 0   No current facility-administered medications for this visit.     Allergies-reviewed and updated Allergies  Allergen Reactions  . Cortisone     swelling    Social History   Social History  . Marital status: Married    Spouse name: N/A  . Number of children:  N/A  . Years of education: N/A   Social History Main Topics  . Smoking status: Never Smoker  . Smokeless tobacco: Never Used  . Alcohol use No  . Drug use: No  . Sexual activity: Not Asked   Other Topics Concern  . None   Social History Narrative   Married 1975. 2 sons. 4 grandkids- all local (21 to 10 in 2018)      [name]   2 years of college.       Hobbies: enjoys watching football with wife [employer], movies, pool in back yard, grandkids.     ROS--Full ROS was completed Review of Systems  Constitutional: Negative for chills and fever.  HENT: Negative for hearing loss and tinnitus.   Eyes: Negative for blurred vision and double vision.  Respiratory: Negative for cough and hemoptysis.   Cardiovascular: Negative for chest pain and palpitations.  Gastrointestinal: Negative for heartburn.    Genitourinary: Negative for dysuria and urgency.  Musculoskeletal: Positive for joint pain (knee right). Negative for myalgias and neck pain.  Skin: Negative for itching and rash.  Neurological: Negative for dizziness and headaches.  Endo/Heme/Allergies: Negative for polydipsia. Does not bruise/bleed easily.       Pet dander allergy  Psychiatric/Behavioral: Negative for hallucinations and substance abuse.   Objective: BP 116/82 (BP Location: Left Arm, Patient Position: Sitting, Cuff Size: Large)   Pulse 86   Temp 98.2 F (36.8 C) (Oral)   Ht 5\' 11"  (1.803 m)   Wt 250 lb 3.2 oz (113.5 kg)   SpO2 94%   BMI 34.90 kg/m  Gen: NAD, resting comfortably HEENT: Mucous membranes are moist. Oropharynx normal. TM normal. Small right lower chest lump < 1 cm- has had ultrasound in past and states unchanged since then Eyes: sclera and lids normal, PERRLA Neck: no thyromegaly, no cervical lymphadenopathy CV: RRR no murmurs rubs or gallops Lungs: CTAB no crackles, wheeze, rhonchi Abdomen: soft/nontender/nondistended/normal bowel sounds. No rebound or guarding.  Ext: no edema Skin: warm, dry, various small mobile lipomas Neuro: 5/5 strength in upper and lower extremities, normal gait, normal reflexes  Assessment/Plan:  Hyperlipidemia S: suspect reasonably controlled on pravastatin 20mg - started due to triglycerides over 200. No myalgias.  Lab Results  Component Value Date   CHOL 189 05/16/2017   HDL 46 05/16/2017   LDLCALC 99 05/16/2017   TRIG 222 (H) 05/16/2017   CHOLHDL 4.1 05/16/2017   A/P: LDL at goal <100 but would prefer triglycerides under 200- started on statin as a result . Weight up 10 lbs over last 18 months- I encouraged him to reverse this trend in hopes it will help his triglycerides.  Repeat lipids at CPE  He blames R knee for lack of exercise but he has a pool and recumbent bike and encouraged him to work around this.    Hyperglycemia S: fasting cbgs elevated at  times Lab Results  Component Value Date   HGBA1C 5.2 09/15/2015  A/P: Encouraged need for healthy eating, regular exercise, weight loss once again   Esophageal reflux S: GERD Complicated by Schatzki's ring. Omeprazole 20mg . Sees Dr. Hilarie Turner A/P:continue current rx   Essential hypertension S: controlled on losartan 50mg .  BP Readings from Last 3 Encounters:  08/09/17 116/82  06/17/17 126/80  06/13/17 (!) 135/93  A/P: We discussed blood pressure goal of <140/90. Continue current meds   Asthma S: prn albuterol- rare use with animal exposure- otherwise pretty much grew out of this from childhood A/P: continue current medications  10/17/17 or later for physical .  Meds ordered this encounter  Medications  . losartan (COZAAR) 50 MG tablet    Sig: Take 1 tablet (50 mg total) by mouth daily.    Dispense:  90 tablet    Refill:  3  . pravastatin (PRAVACHOL) 20 MG tablet    Sig: Take 1 tablet (20 mg total) by mouth daily.    Dispense:  90 tablet    Refill:  3  . Albuterol Sulfate (PROAIR RESPICLICK) 939 (90 Base) MCG/ACT AEPB    Sig: Inhale 2 puffs into the lungs every 4 (four) hours as needed.    Dispense:  1 each    Refill:  3   Return precautions advised. Garret Reddish, MD

## 2017-08-09 NOTE — Assessment & Plan Note (Signed)
S: fasting cbgs elevated at times Lab Results  Component Value Date   HGBA1C 5.2 09/15/2015  A/P: Encouraged need for healthy eating, regular exercise, weight loss once again

## 2017-11-12 ENCOUNTER — Encounter: Payer: BLUE CROSS/BLUE SHIELD | Admitting: Family Medicine

## 2017-12-05 ENCOUNTER — Encounter: Payer: BLUE CROSS/BLUE SHIELD | Admitting: Family Medicine

## 2018-01-03 ENCOUNTER — Encounter: Payer: Self-pay | Admitting: Family Medicine

## 2018-01-03 ENCOUNTER — Ambulatory Visit (INDEPENDENT_AMBULATORY_CARE_PROVIDER_SITE_OTHER): Payer: Medicare Other | Admitting: Family Medicine

## 2018-01-03 VITALS — BP 116/88 | HR 94 | Temp 97.6°F | Ht 71.0 in | Wt 248.8 lb

## 2018-01-03 DIAGNOSIS — E785 Hyperlipidemia, unspecified: Secondary | ICD-10-CM | POA: Diagnosis not present

## 2018-01-03 DIAGNOSIS — Z125 Encounter for screening for malignant neoplasm of prostate: Secondary | ICD-10-CM

## 2018-01-03 DIAGNOSIS — K219 Gastro-esophageal reflux disease without esophagitis: Secondary | ICD-10-CM | POA: Diagnosis not present

## 2018-01-03 DIAGNOSIS — I10 Essential (primary) hypertension: Secondary | ICD-10-CM | POA: Diagnosis not present

## 2018-01-03 DIAGNOSIS — Z23 Encounter for immunization: Secondary | ICD-10-CM

## 2018-01-03 DIAGNOSIS — R739 Hyperglycemia, unspecified: Secondary | ICD-10-CM | POA: Diagnosis not present

## 2018-01-03 DIAGNOSIS — Z0000 Encounter for general adult medical examination without abnormal findings: Secondary | ICD-10-CM

## 2018-01-03 LAB — TRIGLYCERIDES: Triglycerides: 254 mg/dL — ABNORMAL HIGH (ref 0.0–149.0)

## 2018-01-03 LAB — CBC WITH DIFFERENTIAL/PLATELET
Basophils Absolute: 0 10*3/uL (ref 0.0–0.1)
Basophils Relative: 0.6 % (ref 0.0–3.0)
Eosinophils Absolute: 0.1 10*3/uL (ref 0.0–0.7)
Eosinophils Relative: 2.2 % (ref 0.0–5.0)
HCT: 50.7 % (ref 39.0–52.0)
Hemoglobin: 17.1 g/dL — ABNORMAL HIGH (ref 13.0–17.0)
Lymphocytes Relative: 18.5 % (ref 12.0–46.0)
Lymphs Abs: 1.2 10*3/uL (ref 0.7–4.0)
MCHC: 33.6 g/dL (ref 30.0–36.0)
MCV: 89 fl (ref 78.0–100.0)
Monocytes Absolute: 0.7 10*3/uL (ref 0.1–1.0)
Monocytes Relative: 11.8 % (ref 3.0–12.0)
Neutro Abs: 4.2 10*3/uL (ref 1.4–7.7)
Neutrophils Relative %: 66.9 % (ref 43.0–77.0)
Platelets: 232 10*3/uL (ref 150.0–400.0)
RBC: 5.7 Mil/uL (ref 4.22–5.81)
RDW: 13.7 % (ref 11.5–15.5)
WBC: 6.3 10*3/uL (ref 4.0–10.5)

## 2018-01-03 LAB — COMPREHENSIVE METABOLIC PANEL
ALT: 87 U/L — ABNORMAL HIGH (ref 0–53)
AST: 50 U/L — ABNORMAL HIGH (ref 0–37)
Albumin: 4.6 g/dL (ref 3.5–5.2)
Alkaline Phosphatase: 113 U/L (ref 39–117)
BUN: 18 mg/dL (ref 6–23)
CO2: 24 mEq/L (ref 19–32)
Calcium: 9.6 mg/dL (ref 8.4–10.5)
Chloride: 102 mEq/L (ref 96–112)
Creatinine, Ser: 1.13 mg/dL (ref 0.40–1.50)
GFR: 69.21 mL/min (ref >60.00)
Glucose, Bld: 119 mg/dL — ABNORMAL HIGH (ref 70–99)
Potassium: 4.1 mEq/L (ref 3.5–5.1)
Sodium: 136 mEq/L (ref 135–145)
Total Bilirubin: 1.2 mg/dL (ref 0.2–1.2)
Total Protein: 7 g/dL (ref 6.0–8.3)

## 2018-01-03 LAB — IRON,TIBC AND FERRITIN PANEL
%SAT: 37 % (calc) (ref 15–60)
Ferritin: 30 ng/mL (ref 20–380)
Iron: 145 ug/dL (ref 50–180)
TIBC: 391 mcg/dL (calc) (ref 250–425)

## 2018-01-03 LAB — PSA: PSA: 2.34 ng/mL (ref 0.10–4.00)

## 2018-01-03 LAB — HEMOGLOBIN A1C: Hgb A1c MFr Bld: 5.6 % (ref 4.6–6.5)

## 2018-01-03 LAB — LDL CHOLESTEROL, DIRECT: Direct LDL: 93 mg/dL

## 2018-01-03 NOTE — Patient Instructions (Addendum)
If you can find shingrix- please get this at your pharmacy and let us know.   I love your goal of 10 lbs weight loss in a year. Lets check in 6 months from now with hopes of being a tleast 5 lbs down. Can recheck blood pressure then too.   Please stop by lab before you go

## 2018-01-03 NOTE — Assessment & Plan Note (Signed)
Hyperlipidemia- reasonable control in may with LDL under 100 on pravastatin 20mg - started by Dr. Carlota Raspberry (actually was not on statin before labs below). Work on weight loss to get triglycerides down.  Lab Results  Component Value Date   CHOL 189 05/16/2017   HDL 46 05/16/2017   LDLCALC 99 05/16/2017   TRIG 222 (H) 05/16/2017   CHOLHDL 4.1 05/16/2017

## 2018-01-03 NOTE — Assessment & Plan Note (Signed)
Hyperglycemia- update cbgs- high at times in past fasting. a1c not elevated in past luckily. Update today Lab Results  Component Value Date   HGBA1C 5.2 09/15/2015

## 2018-01-03 NOTE — Assessment & Plan Note (Signed)
Hemochromatosis- follows with [name] LFT elevation- very mild- continue to monitor as long as under 100- was oringinal reason for hemochromatosis evaluation. Update cmet, ferritin/iron panel

## 2018-01-03 NOTE — Progress Notes (Signed)
Phone: [phone]  Subjective:  Patient presents today for their annual physical. Chief complaint-noted.   See problem oriented charting- ROS- full  review of systems was completed and negative - full ROS sheeted was complete dby patient with no positive findings.   The following were reviewed and entered/updated in epic: Past Medical History:  Diagnosis Date  . Allergy   . Asthma    childhood asthma  . GERD (gastroesophageal reflux disease)   . 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 Hyperlipidemia   . Hypertension   . Internal hemorrhoids   . Nephrolithiasis    lithotripsy  . Tubular adenoma of colon    Patient Active Problem List   Diagnosis Date Noted  . 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 Asthma 08/09/2017    Priority: Medium  . Essential hypertension 09/15/2015    Priority: Medium  . Hyperglycemia 07/28/2013    Priority: Medium  . Hyperlipidemia 07/28/2013    Priority: Medium  . Esophageal reflux 07/28/2013    Priority: Medium  . History of adenomatous polyp of colon 08/09/2017    Priority: Low   Past Surgical History:  Procedure Laterality Date  . BUNIONECTOMY Bilateral 2005  . patella removed right knee  1969   after football injury. plan was to grind down back of it but it was broken  . TONSILLECTOMY  1958  . VASECTOMY      Family History  Problem Relation Age of Onset  . Gallstones Mother   . Colon cancer Mother   . Lung cancer Mother        discovered near death  . Heart disease Mother        rhythm and mitral valve prolapse  . Alzheimer's disease Father   . Prostate cancer Father        radiation when younger- survived  . Mesothelioma Father   . Heart disease Maternal Grandmother        died at young age from heart issue unkown  . Cancer Maternal Grandfather   . Heart disease Paternal Grandmother    enlarged heart  . Heart attack Paternal Grandfather        age 73  . Nephrolithiasis Sister   . Diabetes Maternal Aunt   . Pancreatic cancer Neg Hx   . Stomach cancer Neg Hx     Medications- reviewed and updated Current Outpatient Medications  Medication Sig Dispense Refill  . Albuterol Sulfate (PROAIR RESPICLICK) 829 (90 Base) MCG/ACT AEPB Inhale 2 puffs into the lungs every 4 (four) hours as needed. 1 each 3  . aspirin EC 81 MG tablet Take 162 mg by mouth daily.    . cholecalciferol (VITAMIN D) 1000 units tablet Take 1,000 Units by mouth daily.    Marland Kitchen losartan (COZAAR) 50 MG tablet Take 1 tablet (50 mg total) by mouth daily. 90 tablet 3  . omeprazole (PRILOSEC) 20 MG capsule TAKE ONE CAPSULE BY MOUTH ONCE DAILY 90 capsule 3  . OVER THE COUNTER MEDICATION Take 1 tablet by mouth. Mens multivitamin without iron    . pravastatin (PRAVACHOL) 20 MG tablet Take 1 tablet (20 mg total) by mouth daily. 90 tablet 3   No current facility-administered medications for this visit.     Allergies-reviewed and updated Allergies  Allergen Reactions  . Cortisone     swelling    Social History   Socioeconomic History  . Marital status:  Married    Spouse name: None  . Number of children: None  . Years of education: None  . Highest education level: None  Social Needs  . Financial resource strain: None  . Food insecurity - worry: None  . Food insecurity - inability: None  . Transportation needs - medical: None  . Transportation needs - non-medical: None  Occupational History  . None  Tobacco Use  . Smoking status: Never Smoker  . Smokeless tobacco: Never Used  Substance and Sexual Activity  . Alcohol use: No    Alcohol/week: 0.0 oz  . Drug use: No  . Sexual activity: None  Other Topics Concern  . None  Social History Narrative   Married 1975. 2 sons. 4 grandkids- all local (21 to 10 in 2018)      [name]   2 years of college.       Hobbies: enjoys watching football with  wife NFL, movies, pool in back yard, grandkids.     Objective: BP 116/88 (BP Location: Left Arm, Patient Position: Sitting, Cuff Size: Large)   Pulse 94   Temp 97.6 F (36.4 C) (Oral)   Ht 5\' 11"  (1.803 m)   Wt 248 lb 12.8 oz (112.9 kg)   SpO2 96%   BMI 34.70 kg/m  Gen: NAD, resting comfortably HEENT: Mucous membranes are moist. Oropharynx normal Neck: no thyromegaly CV: RRR no murmurs rubs or gallops Lungs: CTAB no crackles, wheeze, rhonchi Abdomen: soft/nontender/nondistended/normal bowel sounds. No rebound or guarding. Obese- particularly abdominal obesity Ext: no edema Skin: warm, dry Neuro: grossly normal, moves all extremities, PERRLA Rectal: normal tone, diffusely enlarged prostate, no masses or tenderness  Assessment/Plan:  67 y.o. male presenting for annual physical.  Health Maintenance counseling: 1. Anticipatory guidance: Patient counseled regarding regular dental exams -q6 months, eye exams -yearly, wearing seatbelts.  2. Risk factor reduction:  Advised patient of need for regular exercise and diet rich and fruits and vegetables to reduce risk of heart attack and stroke. Exercise- knee really bothers him with exercise- we discussed trialing the bike or water exercises. Diet-not particularly trying to eat well- eats similar diet to wife- they are both heavy. Trending in the right direction- encouraged in ways to further improve. He set a goal of another 10 lbs in next year.  Wt Readings from Last 3 Encounters:  01/03/18 248 lb 12.8 oz (112.9 kg)  08/09/17 250 lb 3.2 oz (113.5 kg)  06/17/17 258 lb (117 kg)  3. Immunizations/screenings/ancillary studies- discussed shingrix at pharmacy. Given prevnar 13 today- give pneumovax 23 next year Immunization History  Administered Date(s) Administered  . Influenza Split 10/30/2013  . Influenza,inj,Quad PF,6+ Mos 08/03/2014, 09/15/2015, 09/13/2016  . Influenza-Unspecified 11/11/2017  . Pneumococcal Conjugate-13 01/03/2018  .  Pneumococcal-Unspecified 11/22/2006  . Tdap 02/18/2007, 05/16/2017  . Zoster 12/06/2013  4. Prostate cancer screening-  prior low risk PSA trend- update today. Rectal exam low risk but does have BPH which he was not aware of. Has nocturia but no recent worsening Lab Results  Component Value Date   PSA 1.97 03/15/2016   PSA 1.69 08/03/2014   PSA 1.90 07/28/2013   5. Colon cancer screening - 01/23/17 with adenoma and 5 year repeat planned 6. Skin cancer screening- no dermatologist. advised regular sunscreen use. Denies worrisome, changing, or new skin lesions.   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 LFT elevation- very mild- continue to monitor as long as under 100- was oringinal reason for hemochromatosis evaluation. Update cmet, ferritin/iron panel  Hyperlipidemia Hyperlipidemia- reasonable control in may with LDL under 100 on pravastatin 20mg - started by Dr. Carlota Raspberry (actually was not on statin before labs below). Work on weight loss to get triglycerides down.  Lab Results  Component Value Date   CHOL 189 05/16/2017   HDL 46 05/16/2017   LDLCALC 99 05/16/2017   TRIG 222 (H) 05/16/2017   CHOLHDL 4.1 05/16/2017     Hyperglycemia Hyperglycemia- update cbgs- high at times in past fasting. a1c not elevated in past luckily. Update today Lab Results  Component Value Date   HGBA1C 5.2 09/15/2015     Future Appointments  Date Time Provider [place]  06/11/2018 11:00 AM [facility] ONLY [facility] None  06/13/2018  9:00 AM Ennever, Rudell Cobb, MD [facility] None   Return in about 6 months (around 07/03/2018) for weight and blood pressure check.  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  Garret Reddish, MD

## 2018-01-31 ENCOUNTER — Other Ambulatory Visit: Payer: Self-pay | Admitting: *Deleted

## 2018-01-31 MED ORDER — PRAVASTATIN SODIUM 20 MG PO TABS: 20.0000 mg | ORAL_TABLET | Freq: Every day | ORAL | 3 refills | Status: DC

## 2018-02-06 ENCOUNTER — Telehealth: Payer: Self-pay | Admitting: Family Medicine

## 2018-02-06 NOTE — Telephone Encounter (Signed)
MEDICATION: losartan (COZAAR) 50 MG tablet Pantoprazole sod ec tb  PHARMACY:   [place] [place] [phone] (Phone) [phone] (Fax)     IS THIS A 90 DAY SUPPLY : yes  IS PATIENT OUT OF MEDICATION:   IF NOT; HOW MUCH IS LEFT:   LAST APPOINTMENT DATE: @01 /11/19  NEXT APPOINTMENT DATE:@7 /11/2018  OTHER COMMENTS:    **Let patient know to contact pharmacy at the end of the day to make sure medication is ready. **  ** Please notify patient to allow 48-72 hours to process**  **Encourage patient to contact the pharmacy for refills or they can request refills through [facility]**

## 2018-02-07 MED ORDER — LOSARTAN POTASSIUM 50 MG PO TABS: 50.0000 mg | ORAL_TABLET | Freq: Every day | ORAL | 1 refills | Status: DC

## 2018-02-07 NOTE — Telephone Encounter (Signed)
Rx sent to [facility] as requested.

## 2018-02-10 ENCOUNTER — Telehealth: Payer: Self-pay | Admitting: Family Medicine

## 2018-02-10 NOTE — Telephone Encounter (Signed)
Copied from [place]. Topic: Quick Communication - See Telephone Encounter >> Feb 10, 2018  2:00 PM [place]ona Rieger wrote: CRM for notification. See Telephone encounter for:   02/10/18.  [facility] rx needs PA on pantoprazole 20mg . Please call back @ [phone] reference [phone] Fax number [phone]

## 2018-02-10 NOTE — Telephone Encounter (Signed)
Please advise 

## 2018-02-18 ENCOUNTER — Other Ambulatory Visit: Payer: Self-pay | Admitting: *Deleted

## 2018-02-18 DIAGNOSIS — K219 Gastro-esophageal reflux disease without esophagitis: Secondary | ICD-10-CM

## 2018-02-18 MED ORDER — OMEPRAZOLE 20 MG PO CPDR: DELAYED_RELEASE_CAPSULE | ORAL | 0 refills | Status: DC

## 2018-02-19 NOTE — Telephone Encounter (Signed)
Called and spoke with [facility]. The message says pantoprazole but I see GI has placed patient on Omeprazole. Patient was able to pick up a prescription of Omeprazole on 02/18/2018. 90 day supply

## 2018-04-08 ENCOUNTER — Other Ambulatory Visit: Payer: Self-pay | Admitting: Internal Medicine

## 2018-04-08 DIAGNOSIS — K219 Gastro-esophageal reflux disease without esophagitis: Secondary | ICD-10-CM

## 2018-06-06 ENCOUNTER — Other Ambulatory Visit: Payer: Self-pay | Admitting: Family Medicine

## 2018-06-11 ENCOUNTER — Inpatient Hospital Stay: Payer: Medicare Other | Attending: Hematology & Oncology

## 2018-06-11 LAB — CMP (CANCER CENTER ONLY)
ALT: 84 U/L — ABNORMAL HIGH (ref 0–55)
AST: 48 U/L — ABNORMAL HIGH (ref 5–34)
Albumin: 4.3 g/dL (ref 3.5–5.0)
Alkaline Phosphatase: 117 U/L (ref 40–150)
Anion gap: 8 (ref 3–11)
BUN: 17 mg/dL (ref 7–26)
CO2: 24 mmol/L (ref 22–29)
Calcium: 9.5 mg/dL (ref 8.4–10.4)
Chloride: 108 mmol/L (ref 98–109)
Creatinine: 1.12 mg/dL (ref 0.70–1.30)
GFR, Est AFR Am: >60 mL/min (ref >60)
GFR, Estimated: >60 mL/min (ref >60)
Glucose, Bld: 105 mg/dL (ref 70–140)
Potassium: 4.3 mmol/L (ref 3.5–5.1)
Sodium: 140 mmol/L (ref 136–145)
Total Bilirubin: 1 mg/dL (ref 0.2–1.2)
Total Protein: 6.9 g/dL (ref 6.4–8.3)

## 2018-06-11 LAB — CBC WITH DIFFERENTIAL (CANCER CENTER ONLY)
Basophils Absolute: 0.1 10*3/uL (ref 0.0–0.1)
Basophils Relative: 1 %
Eosinophils Absolute: 0.3 10*3/uL (ref 0.0–0.5)
Eosinophils Relative: 5 %
HCT: 48 % (ref 38.4–49.9)
Hemoglobin: 16.3 g/dL (ref 13.0–17.1)
Lymphocytes Relative: 23 %
Lymphs Abs: 1.4 10*3/uL (ref 0.9–3.3)
MCH: 30.3 pg (ref 27.2–33.4)
MCHC: 33.9 g/dL (ref 32.0–36.0)
MCV: 89.3 fL (ref 79.3–98.0)
Monocytes Absolute: 0.6 10*3/uL (ref 0.1–0.9)
Monocytes Relative: 11 %
Neutro Abs: 3.5 10*3/uL (ref 1.5–6.5)
Neutrophils Relative %: 60 %
Platelet Count: 208 10*3/uL (ref 140–400)
RBC: 5.38 MIL/uL (ref 4.20–5.82)
RDW: 13.7 % (ref 11.0–14.6)
WBC Count: 5.9 10*3/uL (ref 4.0–10.3)

## 2018-06-11 LAB — IRON AND TIBC
Iron: 113 ug/dL (ref 42–163)
Saturation Ratios: 33 % — ABNORMAL LOW (ref 42–163)
TIBC: 345 ug/dL (ref 202–409)
UIBC: 232 ug/dL

## 2018-06-11 LAB — FERRITIN: Ferritin: 36 ng/mL (ref 22–316)

## 2018-06-12 ENCOUNTER — Encounter: Payer: Self-pay | Admitting: *Deleted

## 2018-06-12 ENCOUNTER — Telehealth: Payer: Self-pay | Admitting: *Deleted

## 2018-06-12 NOTE — Telephone Encounter (Addendum)
Phone number out of service. Will send message via MyChart  ----- Message from Randall Napoleon, MD sent at 06/11/2018  2:38 PM EDT ----- Call - the iron is nice and low!! NO phlebotomy!!  Laurey Arrow

## 2018-06-13 ENCOUNTER — Inpatient Hospital Stay: Payer: Medicare Other | Admitting: Hematology & Oncology

## 2018-06-13 ENCOUNTER — Encounter: Payer: Self-pay | Admitting: Hematology & Oncology

## 2018-06-13 ENCOUNTER — Other Ambulatory Visit: Payer: Self-pay

## 2018-06-13 NOTE — Progress Notes (Signed)
Hematology and Oncology Follow Up Visit  Randall Turner [id number] 1951-10-31 67 y.o. 06/13/2018   Principle Diagnosis:  Hemochromatosis - compound heterozygote for C282Y and H63D  Current Therapy:   Phlebotomy to maintain ferritin less than 100    Interim History:  Randall Turner is here today for follow-up. He is doing pretty well. We last saw him back in December.  He had lab work done 2 days ago. The lab work showed his ferritin of 36. His iron saturation was 33%.  He is now retired. He is enjoying retirement.  He is not planning to travel anywhere over the summertime.  He did have a nice Father's Day weekend.  He's had no chest pain. He's had no cough or shortness of breath. He's had no nausea or vomiting. His been no change in bowel or bladder habits.  Overall, his performance status is ECOG 0.    Medications:  Allergies as of 06/13/2018      Reactions   Cortisone    swelling      Medication List        Accurate as of 06/13/18  9:25 AM. Always use your most recent med list.          Albuterol Sulfate 108 (90 Base) MCG/ACT Aepb Commonly known as:  PROAIR RESPICLICK Inhale 2 puffs into the lungs every 4 (four) hours as needed.   aspirin EC 81 MG tablet Take 162 mg by mouth daily.   cholecalciferol 1000 units tablet Commonly known as:  VITAMIN D Take 1,000 Units by mouth daily.   losartan 50 MG tablet Commonly known as:  COZAAR TAKE 1 TABLET BY MOUTH  DAILY   omeprazole 20 MG capsule Commonly known as:  PRILOSEC TAKE 1 CAPSULE BY MOUTH  ONCE DAILY   OVER THE COUNTER MEDICATION Take 1 tablet by mouth. Mens multivitamin without iron   pravastatin 20 MG tablet Commonly known as:  PRAVACHOL Take 1 tablet (20 mg total) by mouth daily.       Allergies:  Allergies  Allergen Reactions  . Cortisone     swelling    Past Medical History, Surgical history, Social history, and Family History were reviewed and updated.  Review of  Systems: Review of Systems  Constitutional: Negative.   HENT: Negative.   Eyes: Negative.   Respiratory: Negative.   Cardiovascular: Negative.   Gastrointestinal: Negative.   Genitourinary: Negative.   Musculoskeletal: Negative.   Skin: Negative.   Neurological: Negative.   Endo/Heme/Allergies: Negative.   Psychiatric/Behavioral: Negative.       Physical Exam:  weight is 254 lb (115.2 kg). His oral temperature is 98.2 F (36.8 C). His blood pressure is 139/88 and his pulse is 86. His respiration is 16 and oxygen saturation is 97%.   Wt Readings from Last 3 Encounters:  06/13/18 254 lb (115.2 kg)  01/03/18 248 lb 12.8 oz (112.9 kg)  08/09/17 250 lb 3.2 oz (113.5 kg)    Physical Exam  Constitutional: He is oriented to person, place, and time.  HENT:  Head: Normocephalic and atraumatic.  Mouth/Throat: Oropharynx is clear and moist.  Eyes: Pupils are equal, round, and reactive to light. EOM are normal.  Neck: Normal range of motion.  Cardiovascular: Normal rate, regular rhythm and normal heart sounds.  Pulmonary/Chest: Effort normal and breath sounds normal.  Abdominal: Soft. Bowel sounds are normal.  Musculoskeletal: Normal range of motion. He exhibits no edema, tenderness or deformity.  Lymphadenopathy:    He has no cervical  adenopathy.  Neurological: He is alert and oriented to person, place, and time.  Skin: Skin is warm and dry. No rash noted. No erythema.  Psychiatric: He has a normal mood and affect. His behavior is normal. Judgment and thought content normal.  Vitals reviewed.     Lab Results  Component Value Date   WBC 5.9 06/11/2018   HGB 16.3 06/11/2018   HCT 48.0 06/11/2018   MCV 89.3 06/11/2018   PLT 208 06/11/2018   Lab Results  Component Value Date   FERRITIN 36 06/11/2018   IRON 113 06/11/2018   TIBC 345 06/11/2018   UIBC 232 06/11/2018   IRONPCTSAT 33 (L) 06/11/2018   Lab Results  Component Value Date   RBC 5.38 06/11/2018   No results  found for: KPAFRELGTCHN, LAMBDASER, KAPLAMBRATIO No results found for: IGGSERUM, IGA, IGMSERUM No results found for: Odetta Pink, SPEI   Chemistry      Component Value Date/Time   NA 140 06/11/2018 1111   NA 141 06/11/2017 1401   K 4.3 06/11/2018 1111   K 4.3 06/11/2017 1401   CL 108 06/11/2018 1111   CO2 24 06/11/2018 1111   CO2 25 06/11/2017 1401   BUN 17 06/11/2018 1111   BUN 18.8 06/11/2017 1401   CREATININE 1.12 06/11/2018 1111   CREATININE 1.2 06/11/2017 1401      Component Value Date/Time   CALCIUM 9.5 06/11/2018 1111   CALCIUM 10.1 06/11/2017 1401   ALKPHOS 117 06/11/2018 1111   ALKPHOS 122 06/11/2017 1401   AST 48 (H) 06/11/2018 1111   AST 32 06/11/2017 1401   ALT 84 (H) 06/11/2018 1111   ALT 53 06/11/2017 1401   BILITOT 1.0 06/11/2018 1111   BILITOT 0.89 06/11/2017 1401     Impression and Plan: Randall Turner is a 67 yo gentleman with hemochromatosis - compound heterozygous. His last phlebotomy was in December of 2016.  From my point of view, everything looks quite good. We do not have to phlebotomize him.  I think we can see him back in 1 year. I do not see anything that suggests that the hemochromatosis will be a problem.   Volanda Napoleon, MD 6/21/20199:25 AM

## 2018-07-01 ENCOUNTER — Other Ambulatory Visit: Payer: Self-pay | Admitting: Internal Medicine

## 2018-07-01 DIAGNOSIS — K219 Gastro-esophageal reflux disease without esophagitis: Secondary | ICD-10-CM

## 2018-07-04 ENCOUNTER — Ambulatory Visit: Payer: Medicare Other | Admitting: Family Medicine

## 2018-07-04 ENCOUNTER — Encounter: Payer: Self-pay | Admitting: Family Medicine

## 2018-07-04 VITALS — BP 130/86 | HR 96 | Temp 97.6°F | Ht 71.0 in | Wt 250.8 lb

## 2018-07-04 DIAGNOSIS — E785 Hyperlipidemia, unspecified: Secondary | ICD-10-CM | POA: Diagnosis not present

## 2018-07-04 DIAGNOSIS — I10 Essential (primary) hypertension: Secondary | ICD-10-CM | POA: Diagnosis not present

## 2018-07-04 NOTE — Assessment & Plan Note (Signed)
S: controlled on losartan 50mg . Home diastolics in 01I.  BP Readings from Last 3 Encounters:  07/04/18 130/86  06/13/18 139/88  01/03/18 116/88  A/P: We discussed blood pressure goal of <140/90. Continue current meds

## 2018-07-04 NOTE — Patient Instructions (Addendum)
Your goal today was to get back on the treadmill 3 days a week and swim in your pool 4x a week. Continue to remain active in the yard.  When you come back for physical would be great if you could bring a food journal for week prior to that visit.   Please check with your pharmacy to see if they have the shingrix vaccine. If they do- please get this immunization and update [place] by phone call or mychart with dates you receive the vaccine

## 2018-07-04 NOTE — Assessment & Plan Note (Addendum)
S: reasonably controlled on pravastatin 20mg  with LDL under 100. Would prefer triglycerides under 200 and have discussed lifestyle changes. Weight up 2 lbs since january Lab Results  Component Value Date   CHOL 189 05/16/2017   HDL 46 05/16/2017   LDLCALC 99 05/16/2017   LDLDIRECT 93.0 01/03/2018   TRIG 254.0 (H) 01/03/2018   CHOLHDL 4.1 05/16/2017   A/P: continue pravastatin. Update LFTs which have shown mild elevation in past- suspect less statin and more related to obesity or could be prior damage from hemochromatosis. Reviewed labs from [name]- hgb now normal. Lfts remain elevated- discussed weight loss once again.

## 2018-07-04 NOTE — Progress Notes (Signed)
Subjective:  Randall Turner. is a 67 y.o. year old very pleasant male patient who presents for/with See problem oriented charting ROS- No chest pain or shortness of breath. No headache or blurry vision.    Past Medical History-  Patient Active Problem List   Diagnosis Date Noted  . 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 Asthma 08/09/2017    Priority: Medium  . Essential hypertension 09/15/2015    Priority: Medium  . Hyperglycemia 07/28/2013    Priority: Medium  . Hyperlipidemia 07/28/2013    Priority: Medium  . Esophageal reflux 07/28/2013    Priority: Medium  . History of adenomatous polyp of colon 08/09/2017    Priority: Low    Medications- reviewed and updated Current Outpatient Medications  Medication Sig Dispense Refill  . Albuterol Sulfate (PROAIR RESPICLICK) 606 (90 Base) MCG/ACT AEPB Inhale 2 puffs into the lungs every 4 (four) hours as needed. 1 each 3  . aspirin EC 81 MG tablet Take 162 mg by mouth daily.    . cholecalciferol (VITAMIN D) 1000 units tablet Take 1,000 Units by mouth daily.    Marland Kitchen losartan (COZAAR) 50 MG tablet TAKE 1 TABLET BY MOUTH  DAILY 90 tablet 1  . omeprazole (PRILOSEC) 20 MG capsule TAKE 1 CAPSULE BY MOUTH  ONCE DAILY 90 capsule 0  . OVER THE COUNTER MEDICATION Take 1 tablet by mouth. Mens multivitamin without iron    . pravastatin (PRAVACHOL) 20 MG tablet Take 1 tablet (20 mg total) by mouth daily. 90 tablet 3   No current facility-administered medications for this visit.     Objective: BP 130/86 (BP Location: Left Arm, Patient Position: Sitting, Cuff Size: Normal)   Pulse 96   Temp 97.6 F (36.4 C) (Oral)   Ht 5\' 11"  (1.803 m)   Wt 250 lb 12.8 oz (113.8 kg)   SpO2 96%   BMI 34.98 kg/m  Gen: NAD, resting comfortably CV: RRR no murmurs rubs or gallops Lungs: CTAB no crackles, wheeze, rhonchi Abdomen: soft/nontender/nondistended/obese Ext: no  edema Skin: warm, dry Neuro: speech normal, moves all extremities  Assessment/Plan:  Other notes: 1.weight stable from last time (didn't take shoes off this time). He is planning to dust off treadmill and do some more swimming. He wants to start here. Discussed [employer] or weight watchers-opposed at this point  Essential hypertension S: controlled on losartan 50mg . Home diastolics in 30Z.  BP Readings from Last 3 Encounters:  07/04/18 130/86  06/13/18 139/88  01/03/18 116/88  A/P: We discussed blood pressure goal of <140/90. Continue current meds  Hyperlipidemia S: reasonably controlled on pravastatin 20mg  with LDL under 100. Would prefer triglycerides under 200 and have discussed lifestyle changes. Weight up 2 lbs since january Lab Results  Component Value Date   CHOL 189 05/16/2017   HDL 46 05/16/2017   LDLCALC 99 05/16/2017   LDLDIRECT 93.0 01/03/2018   TRIG 254.0 (H) 01/03/2018   CHOLHDL 4.1 05/16/2017   A/P: continue pravastatin. Update LFTs which have shown mild elevation in past- suspect less statin and more related to obesity or could be prior damage from hemochromatosis. Reviewed labs from [name]- hgb now normal. Lfts remain elevated- discussed weight loss once again.   Future Appointments  Date Time Provider [place]  06/17/2019 11:00 AM [facility] 3 [facility] None  06/19/2019  9:00 AM [place], Rudell Cobb, MD [facility] None   Return in about 6 months (around  01/04/2019) for physical.  Return precautions advised.  Garret Reddish, MD

## 2018-08-06 ENCOUNTER — Other Ambulatory Visit: Payer: Self-pay | Admitting: Internal Medicine

## 2018-08-06 DIAGNOSIS — K219 Gastro-esophageal reflux disease without esophagitis: Secondary | ICD-10-CM

## 2018-08-22 ENCOUNTER — Telehealth: Payer: Self-pay | Admitting: Internal Medicine

## 2018-08-22 DIAGNOSIS — K219 Gastro-esophageal reflux disease without esophagitis: Secondary | ICD-10-CM

## 2018-08-22 MED ORDER — OMEPRAZOLE 20 MG PO CPDR
DELAYED_RELEASE_CAPSULE | ORAL | 0 refills | Status: DC
Start: 2018-08-22 — End: 2018-10-22

## 2018-08-22 NOTE — Telephone Encounter (Signed)
Rx sent 

## 2018-09-11 ENCOUNTER — Encounter: Payer: Self-pay | Admitting: Family Medicine

## 2018-09-11 ENCOUNTER — Ambulatory Visit (INDEPENDENT_AMBULATORY_CARE_PROVIDER_SITE_OTHER): Payer: Medicare Other

## 2018-09-11 DIAGNOSIS — Z23 Encounter for immunization: Secondary | ICD-10-CM | POA: Diagnosis not present

## 2018-09-11 NOTE — Progress Notes (Signed)
Patient in today for Flu vaccine. VIS given. Administered in right arm. Tolerated well 

## 2018-09-11 NOTE — Patient Instructions (Signed)
Health Maintenance Due  Topic Date Due  . INFLUENZA VACCINE  07/24/2018    Depression screen [facility] 2/9 07/04/2018 05/16/2017 10/16/2016  Decreased Interest 0 0 0  Down, Depressed, Hopeless 0 0 0  PHQ - 2 Score 0 0 0

## 2018-10-10 ENCOUNTER — Other Ambulatory Visit: Payer: Self-pay | Admitting: Internal Medicine

## 2018-10-10 DIAGNOSIS — K219 Gastro-esophageal reflux disease without esophagitis: Secondary | ICD-10-CM

## 2018-10-22 ENCOUNTER — Encounter: Payer: Self-pay | Admitting: Internal Medicine

## 2018-10-22 ENCOUNTER — Ambulatory Visit: Payer: Medicare Other | Admitting: Internal Medicine

## 2018-10-22 VITALS — BP 132/80 | HR 76 | Ht 71.0 in | Wt 257.0 lb

## 2018-10-22 DIAGNOSIS — K219 Gastro-esophageal reflux disease without esophagitis: Secondary | ICD-10-CM | POA: Diagnosis not present

## 2018-10-22 DIAGNOSIS — Z86010 Personal history of colonic polyps: Secondary | ICD-10-CM | POA: Diagnosis not present

## 2018-10-22 MED ORDER — OMEPRAZOLE 20 MG PO CPDR: DELAYED_RELEASE_CAPSULE | ORAL | 3 refills | Status: DC

## 2018-10-22 NOTE — Progress Notes (Signed)
Subjective:    Patient ID: Randall Turner., male    DOB: 06-01-1951, 67 y.o.   MRN: [id number]  HPI Randall Turner is a 67 year old male with a past medical history of GERD with hiatal hernia, adenomatous colon polyps, hemochromatosis following with [name], hypertension, hyperlipidemia and history of kidney stones who is here for follow-up.  He was last seen on 06/17/2017.  He is here alone today.  He reports that he is doing well.  He continues on omeprazole 20 mg a day.  This well controlled his reflux symptoms.  He is not having heartburn.  No issues with dysphagia or odynophagia.  No nausea or vomiting.  He does not have water brash or pyrosis with the medication.  He reports symptoms is very stable.  He does follow with hematology and was seen back in June 2019.  At that point his liver enzymes were normal as were his iron studies.  His ferritin was less than 50.  He has not needed phlebotomy in quite some time.  No issues with jaundice, itching, lower extremity swelling or increasing abdominal girth/swelling.   Review of Systems As per HPI, otherwise negative  Current Medications, Allergies, Past Medical History, Past Surgical History, Family History and Social History were reviewed in [employer] record.      Objective:   Physical Exam BP 132/80   Pulse 76   Ht 5\' 11"  (1.803 m)   Wt 257 lb (116.6 kg)   BMI 35.84 kg/m  Gen: awake, alert, NAD HEENT: anicteric, op clear CV: RRR, no mrg Pulm: CTA b/l Abd: soft, NT/ND, +BS throughout Ext: no c/c/e Neuro: nonfocal  CBC    Component Value Date/Time   WBC 5.9 06/11/2018 1111   WBC 6.3 01/03/2018 1103   RBC 5.38 06/11/2018 1111   HGB 16.3 06/11/2018 1111   HGB 15.9 06/11/2017 1401   HCT 48.0 06/11/2018 1111   HCT 46.8 06/11/2017 1401   PLT 208 06/11/2018 1111   PLT 231 06/11/2017 1401   MCV 89.3 06/11/2018 1111   MCV 85.1 06/11/2017 1401   MCH 30.3 06/11/2018 1111   MCHC 33.9  06/11/2018 1111   RDW 13.7 06/11/2018 1111   RDW 14.6 06/11/2017 1401   LYMPHSABS 1.4 06/11/2018 1111   LYMPHSABS 1.8 06/11/2017 1401   MONOABS 0.6 06/11/2018 1111   MONOABS 0.9 06/11/2017 1401   EOSABS 0.3 06/11/2018 1111   EOSABS 0.3 06/11/2017 1401   EOSABS 0.3 09/13/2016 1354   BASOSABS 0.1 06/11/2018 1111   BASOSABS 0.1 06/11/2017 1401   CMP     Component Value Date/Time   NA 140 06/11/2018 1111   NA 141 06/11/2017 1401   K 4.3 06/11/2018 1111   K 4.3 06/11/2017 1401   CL 108 06/11/2018 1111   CO2 24 06/11/2018 1111   CO2 25 06/11/2017 1401   GLUCOSE 105 06/11/2018 1111   GLUCOSE 96 06/11/2017 1401   BUN 17 06/11/2018 1111   BUN 18.8 06/11/2017 1401   CREATININE 1.12 06/11/2018 1111   CREATININE 1.2 06/11/2017 1401   CALCIUM 9.5 06/11/2018 1111   CALCIUM 10.1 06/11/2017 1401   PROT 6.9 06/11/2018 1111   PROT 7.0 06/11/2017 1401   ALBUMIN 4.3 06/11/2018 1111   ALBUMIN 4.2 06/11/2017 1401   AST 32 06/11/2017 1401   ALT 53 06/11/2017 1401   ALKPHOS 117 06/11/2018 1111   ALKPHOS 122 06/11/2017 1401   BILITOT 1.0 06/11/2018 1111   BILITOT 0.89 06/11/2017 1401  GFRNONAA >60 06/11/2018 1111   GFRNONAA 63 09/15/2015 1009   GFRAA >60 06/11/2018 1111   GFRAA 72 09/15/2015 1009   Iron/TIBC/Ferritin/ %Sat    Component Value Date/Time   IRON 113 06/11/2018 1110   IRON 99 06/11/2017 1401   TIBC 345 06/11/2018 1110   TIBC 395 06/11/2017 1401   FERRITIN 36 06/11/2018 1110   FERRITIN 20 (L) 06/11/2017 1401   IRONPCTSAT 33 (L) 06/11/2018 1110   IRONPCTSAT 37 01/03/2018 1103       Assessment & Plan:   67 year old male with a past medical history of GERD with hiatal hernia, adenomatous colon polyps, hemochromatosis following with [name], hypertension, hyperlipidemia and history of kidney stones who is here for follow-up.  1. GERD --stable symptoms without alarm symptom.  He recalls a previous endoscopy 8 or 9 years ago with no report of Barrett's esophagus.  We  will continue omeprazole 20 mg once daily.  He is reminded to let me know if any change in symptoms or any troublesome upper GI symptoms.  He can follow-up for this issue in 1 to 2 years, sooner if needed.  2.  History of colon polyps --2 subcentimeter adenomas removed in January 2018, recall January 2023  3.  Hemochromatosis --following with hematology and not needing any recent phlebotomy.  Ferritin in the summer was 33 and so no need for phlebotomy at this time.  Last liver enzymes were normal.  No evidence for advanced liver disease or cirrhosis  15 minutes spent with the patient today. Greater than 50% was spent in counseling and coordination of care with the patient

## 2018-10-22 NOTE — Patient Instructions (Signed)
We have sent the following prescriptions to your mail in pharmacy: Omeprazole 20 mg daily  If you have not heard from your mail in pharmacy within 1 week or if you have not received your medication in the mail, please contact us at [phone] so we may find out why.  Please follow up with Dr [name] in 1-2 years.  If you are age 66 or older, your body mass index should be between 23-30. Your Body mass index is 35.84 kg/m. If this is out of the aforementioned range listed, please consider follow up with your Primary Care Provider.  If you are age 37 or younger, your body mass index should be between 19-25. Your Body mass index is 35.84 kg/m. If this is out of the aformentioned range listed, please consider follow up with your Primary Care Provider.

## 2018-10-24 ENCOUNTER — Telehealth: Payer: Self-pay | Admitting: Family Medicine

## 2018-10-24 DIAGNOSIS — L989 Disorder of the skin and subcutaneous tissue, unspecified: Secondary | ICD-10-CM

## 2018-10-24 NOTE — Telephone Encounter (Signed)
Yes thanks, may refer to dermatology under skin lesion

## 2018-10-24 NOTE — Telephone Encounter (Signed)
Copied from [place] [id number]. Topic: Referral - Request for Referral >> Oct 24, 2018  3:21 PM Judyann Munson wrote: Has patient seen PCP for this complaint? Yes  Referral for which specialty: Dermatologist  Preferred provider/office: [facility] Location  Reason for referral:  Several spots on back- the shape has changed  and wants to make sure its not cancer.

## 2018-10-29 NOTE — Telephone Encounter (Signed)
Referral placed. Called pt and left VM to call the office (wanted to let pt know referral has been placed and double check if he has a preference of who and where he is referred to).

## 2018-10-29 NOTE — Telephone Encounter (Signed)
See note

## 2018-10-29 NOTE — Telephone Encounter (Signed)
Patient is calling back and states he has been to [facility] before and that would be okay.

## 2018-10-30 NOTE — Telephone Encounter (Signed)
Forwarding to [place] as FYI regarding referral preference.

## 2018-11-10 ENCOUNTER — Ambulatory Visit (INDEPENDENT_AMBULATORY_CARE_PROVIDER_SITE_OTHER): Payer: Medicare Other

## 2018-11-10 ENCOUNTER — Ambulatory Visit: Payer: Medicare Other | Admitting: Family Medicine

## 2018-11-10 ENCOUNTER — Encounter: Payer: Self-pay | Admitting: Family Medicine

## 2018-11-10 ENCOUNTER — Telehealth: Payer: Self-pay

## 2018-11-10 VITALS — BP 112/68 | HR 74 | Temp 97.4°F | Ht 71.0 in | Wt 255.6 lb

## 2018-11-10 DIAGNOSIS — M25571 Pain in right ankle and joints of right foot: Secondary | ICD-10-CM | POA: Diagnosis not present

## 2018-11-10 DIAGNOSIS — S92353A Displaced fracture of fifth metatarsal bone, unspecified foot, initial encounter for closed fracture: Secondary | ICD-10-CM | POA: Diagnosis not present

## 2018-11-10 DIAGNOSIS — S92351A Displaced fracture of fifth metatarsal bone, right foot, initial encounter for closed fracture: Secondary | ICD-10-CM | POA: Diagnosis not present

## 2018-11-10 NOTE — Telephone Encounter (Signed)
Phone call received from [place] at [place]. There is an acute nondisplaced base of the 5th metatarsal fracture noted. Dr. Yong Channel and Dr. Rogers Blocker notified

## 2018-11-10 NOTE — Patient Instructions (Signed)
LOOK UP SEBORRHEIC KERATOSIS... What is on your back     F/u with dr. Paulla Fore in 2 weeks.Marland KitchenMarland Kitchen

## 2018-11-10 NOTE — Telephone Encounter (Signed)
Thank you Dr. Rogers Blocker for caring for him!

## 2018-11-10 NOTE — Progress Notes (Signed)
Patient: Randall Turner. MRN: [id number] DOB: Dec 22, 1951 PCP: Marin Olp, MD     Subjective:  Chief Complaint  Patient presents with  . poss sprained right foot/ankle    HPI: The patient is a 67 y.o. male who presents today for right ankle/foot injury. He took a spill last night at the end of his driveway. His driveway has a slope and he stepped on it with his right foot and then inverted his foot. He felt a burn along the lateral side of his right foot following event. He actually fell onto the ground. Didn't hit head. His knee and hand. He had immediate swelling. He was able to walk back to the house. He wrapped it and elevated it as it was midnight when it happened. Swelling has improved today. He has taken naproxen. He is able to weight bear with a hobble. Pain rated as a 5/10 and is sharp and stabbing (when weight bearing). If he is sitting or non weight bearing he feels no pain.   Review of Systems  Musculoskeletal: Positive for arthralgias.       Right foot/ankle pain and swelling  Skin: Positive for color change.       Bruising on outer aspect of right foot    Allergies Patient is allergic to cortisone.  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  Surgical History Patient  has a past surgical history that includes Vasectomy; patella removed right knee (1969); Bunionectomy (Bilateral, 2005); and Tonsillectomy (1958).  Family History Pateint's family history includes Alzheimer's disease in his father; Cancer in his maternal grandfather; Colon cancer in his mother; Diabetes in his maternal aunt; Gallstones in his mother; Heart attack in his paternal grandfather; Heart disease in his maternal  grandmother, mother, and paternal grandmother; Lung cancer in his mother; Mesothelioma in his father; Nephrolithiasis in his sister; Prostate cancer in his father.  Social History Patient  reports that he has never smoked. He has never used smokeless tobacco. He reports that he does not drink alcohol or use drugs.    Objective: Vitals:   11/10/18 1333  BP: 112/68  Pulse: 74  Temp: (!) 97.4 F (36.3 C)  TempSrc: Oral  SpO2: 95%  Weight: 255 lb 9.6 oz (115.9 kg)  Height: 5\' 11"  (1.803 m)    Body mass index is 35.65 kg/m.  Physical Exam  Constitutional: He appears well-developed and well-nourished.  Musculoskeletal:  Right foot/ankle: edema over lateral malleolus. No point tenderness on palpation. Pedal pulses intact. Negative squeeze test. Tenderness to palpation just inferior to lateral malleolus. Also has TTP along 5th metatarsal. Flexion and extension intact, but pain with extension. Actually has decent valgus and varus strain.   Vitals reviewed.     xray: no acute findings on ankle xray. He has a nondisplaced fracture of his right 5th metatarsal base.    Assessment/plan: 1. Closed fracture of fifth metatarsal bone, physeal involvement unspecified, unspecified laterality, initial encounter -putting him in walking boot after talking with dr. Paulla Fore. He will have f/u with dr. Paulla Fore in 2 weeks. Stay in walking boot while weight bearing and can continue ice, elevation and NSAIDS prn. He declines any other pain medication and states that the naproxen works fine for him.  - DG Ankle Complete Right; Future - DG Foot Complete Right; Future  Return in about 2 weeks (around 11/24/2018) for dr.rigby .   Orma Flaming, MD [place]   11/10/2018

## 2018-11-24 ENCOUNTER — Ambulatory Visit (INDEPENDENT_AMBULATORY_CARE_PROVIDER_SITE_OTHER): Payer: Medicare Other

## 2018-11-24 ENCOUNTER — Encounter: Payer: Self-pay | Admitting: Sports Medicine

## 2018-11-24 ENCOUNTER — Ambulatory Visit: Payer: Medicare Other | Admitting: Sports Medicine

## 2018-11-24 VITALS — BP 148/90 | HR 96 | Ht 71.0 in | Wt 257.8 lb

## 2018-11-24 DIAGNOSIS — S92353A Displaced fracture of fifth metatarsal bone, unspecified foot, initial encounter for closed fracture: Secondary | ICD-10-CM

## 2018-11-24 DIAGNOSIS — S92351D Displaced fracture of fifth metatarsal bone, right foot, subsequent encounter for fracture with routine healing: Secondary | ICD-10-CM | POA: Diagnosis not present

## 2018-11-24 DIAGNOSIS — M79671 Pain in right foot: Secondary | ICD-10-CM

## 2018-11-24 DIAGNOSIS — S92351A Displaced fracture of fifth metatarsal bone, right foot, initial encounter for closed fracture: Secondary | ICD-10-CM | POA: Insufficient documentation

## 2018-11-24 DIAGNOSIS — S93491A Sprain of other ligament of right ankle, initial encounter: Secondary | ICD-10-CM

## 2018-11-24 NOTE — Progress Notes (Signed)
Randall Turner. [name], [name] - 67 y.o. male MRN [id number]  Date of birth: May 07, 1951  Visit Date:   PCP: Marin Olp, MD   Referred by: Marin Olp, MD   SUBJECTIVE:  Delanna Ahmadi. "Randall Turner" is here for Closed fracture of fifth metatarsal bone, R foot (Injured R foot 11/09/18, rolled ankle to the end of driveway. Put in boot by Dr. Rogers Blocker. Taking Naproxen prn and wearing boot. Minimal pain/swelling. Initially there was swelling, bruising, denies increased warmth/erythema. )   HPI: Patient presents for evaluation after an injury on 11/09/2018 that resulted in an inversion type injury to the right foot.  He was seen and diagnosed with a base of the fifth metatarsal fracture placed in a walking boot and reports overall good improvement in his symptoms.  Reports only mild pain and swelling at this time.  No radiation.  He does have some pain with weightbearing without the boot as well as with passive flexion extension.  He has not been sleeping in the boot but otherwise has been good with any weightbearing.  He has been taking naproxen intermittently.  REVIEW OF SYSTEMS: Denies night time disturbances. Denies fevers, chills, or night sweats. Denies unexplained weight loss. Denies personal history of cancer. Denies changes in bowel or bladder habits. Reports recent unreported falls. Denies new or worsening dyspnea or wheezing. Denies headaches or dizziness.  Denies numbness, tingling or weakness  In the extremities.  Denies dizziness or presyncopal episodes Reports lower extremity edema    HISTORY:  Prior history reviewed and updated per electronic medical record.  Social History  Occupational History  . Not on file  Tobacco Use  . Smoking status: Never Smoker  . Smokeless tobacco: Never Used  Substance and Sexual Activity  . Alcohol use: No    Alcohol/week:  0.0 standard drinks  . Drug use: No  . Sexual activity: Not on file   Social History  Social History Narrative   Married 1975. 2 sons. 4 grandkids- all local (21 to 10 in 2018)      [name]   2 years of college.       Hobbies: enjoys watching football with wife [employer], movies, pool in back yard, grandkids.      Past Medical History:  Diagnosis Date  . Allergy   . Asthma    childhood asthma  . GERD (gastroesophageal reflux disease)   . 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 Hyperlipidemia   . Hypertension   . Internal hemorrhoids   . Nephrolithiasis    lithotripsy  . Tubular adenoma of colon      Past Surgical History:  Procedure Laterality Date  . BUNIONECTOMY Bilateral 2005  . patella removed right knee  1969   after football injury. plan was to grind down back of it but it was broken  . TONSILLECTOMY  1958  . VASECTOMY      family history includes Alzheimer's disease in his father; Cancer in his maternal grandfather; Colon cancer in his mother; Diabetes in his maternal aunt; Gallstones in his mother; Heart attack in his paternal grandfather; Heart disease in his maternal grandmother, mother, and paternal grandmother; Lung cancer in his mother; Mesothelioma in his father; Nephrolithiasis in his sister; Prostate cancer in his father. There is no history of Pancreatic cancer or Stomach cancer.  DATA OBTAINED & REVIEWED:  Recent Labs    01/03/18 1103 06/11/18 1111  HGBA1C 5.6  --   CALCIUM 9.6 9.5  AST 50* 48*  ALT 87* 84*   No problems updated. No specialty comments available.  OBJECTIVE:  VS:  HT:5\' 11"  (180.3 cm)   WT:257 lb 12.8 oz (116.9 kg)  BMI:35.97    BP: (!) 148/90   HR:96bpm  TEMP: ( )  RESP:94 %   PHYSICAL EXAM: CONSTITUTIONAL: Well-developed, Well-nourished and In no acute distress PSYCHIATRIC : Alert & appropriately interactive. and Not depressed or anxious  appearing. RESPIRATORY : No increased work of breathing and Trachea Midline EYES : Pupils are equal., EOM intact without nystagmus. and No scleral icterus.  VASCULAR EXAM : Warm and well perfused NEURO: unremarkable  MSK Exam:  Right foot  Well aligned, no significant deformity. Minimal bruising over the foot as well as anterior ankle resolving well and is slightly yellow at this time. TTP over Anterior aspect of the ankle.  Anterior aspect base of the fifth metatarsal. Mild swelling Good dorsiflexion and plantarflexion range of motion with no focal pain with resisted inversion or eversion. Ligamentously stable to Anterior drawer and talar tilting. Normal kleiger testing pain localizing to the base of the fifth metatarsal     ASSESSMENT  1. Closed fracture of fifth metatarsal bone, physeal involvement unspecified, unspecified laterality, initial encounter   2. Right foot pain   3. Sprain of anterior talofibular ligament of right ankle, initial encounter     PLAN:  Pertinent additional documentation may be included in corresponding procedure notes, imaging studies, problem based documentation and patient instructions.  Procedures:  None  Medications:  No orders of the defined types were placed in this encounter. Discussion/Instructions: No problem-specific Assessment & Plan notes found for this encounter. He does have both a lateral ankle sprain and base of the fifth metatarsal zone 1 avulsion fracture.  X-rays today are stable and these were reviewed with him in detail.  We will plan to have him continue with the fracture boot immobilization for an additional 2 weeks especially given the underlying anterior lateral ankle pain and likely have him come out of the boot in an additional 2 weeks and transition either into a stiff soled shoe or ASO as needed. Discussed red flag symptoms that warrant earlier emergent evaluation and patient voices understanding. Activity modifications  and the importance of avoiding exacerbating activities (limiting pain to no more than a 4 / 10 during or following activity) recommended and discussed.  Follow-up:  Return in about 2 weeks (around 12/08/2018) for repeat clinical exam.  If any lack of improvement: consider further diagnostic evaluation with Repeat x-rays if any persistent symptoms. At follow up will plan : Will need to have him begin therapy exercise program at follow-up.         Gerda Diss, [place] [facility] Physician

## 2018-12-08 ENCOUNTER — Encounter: Payer: Self-pay | Admitting: Sports Medicine

## 2018-12-08 ENCOUNTER — Ambulatory Visit: Payer: Medicare Other | Admitting: Sports Medicine

## 2018-12-08 VITALS — BP 138/88 | HR 82 | Ht 71.0 in | Wt 260.2 lb

## 2018-12-08 DIAGNOSIS — M79671 Pain in right foot: Secondary | ICD-10-CM | POA: Diagnosis not present

## 2018-12-08 DIAGNOSIS — S92351D Displaced fracture of fifth metatarsal bone, right foot, subsequent encounter for fracture with routine healing: Secondary | ICD-10-CM | POA: Diagnosis not present

## 2018-12-08 NOTE — Progress Notes (Signed)
Juanda Bond. Sanford Lindblad, [place] at [facility] [phone]  Fransico Meadow Ladarian Bonczek. - 67 y.o. male MRN [id number]  Date of birth: [birth year]  Visit Date:   PCP: Marin Olp, MD   Referred by: Marin Olp, MD   SUBJECTIVE:  Chief Complaint  Patient presents with  . f/u closed fracture of fifth metatarsal bone    Sx are improving. Wearing boot during the day. No swelling, n/t, weakness.     HPI: Patient is here for 2-week follow-up after initial injury on 11/09/2018.  He had a base of the fifth metatarsal fracture and has been in fracture boot since that time.  He is taking Aleve intermittently.  He has been staying off his feet as much as possible.  Overall feeling significantly better.  REVIEW OF SYSTEMS: Denies any significant lower extremity edema peer does have some numbness in his feet that is chronic and unchanged from prior surgeries.  Otherwise he denies any fevers, chills, night sweats or nighttime awakenings due to these issues.  HISTORY:  Prior history reviewed and updated per electronic medical record.  Social History   Occupational History  . Not on file  Tobacco Use  . Smoking status: Never Smoker  . Smokeless tobacco: Never Used  Substance and Sexual Activity  . Alcohol use: No    Alcohol/week: 0.0 standard drinks  . Drug use: No  . Sexual activity: Not on file   Social History   Social History Narrative   Married 1975. 2 sons. 4 grandkids- all local (21 to 10 in 2018)      [name]   2 years of college.       Hobbies: enjoys watching football with wife NFL, movies, pool in back yard, grandkids.       DATA OBTAINED & REVIEWED:  Recent Labs    01/03/18 1103 06/11/18 1111  HGBA1C 5.6  --   CALCIUM 9.6 9.5  AST 50* 48*  ALT 87* 84*   No problems updated. No specialty comments available.  OBJECTIVE:  VS:  HT:5\' 11"  (180.3 cm)   WT:260 lb 3.2 oz (118 kg)  BMI:36.31     BP:138/88  HR:82bpm  TEMP: ( )  RESP:95 %   PHYSICAL EXAM: Right foot is overall well aligned with well-healed postsurgical incision over his great toe from a prior osteotomy.  Is no focal tenderness palpation around the ankle joint but does have pain over the dorsal aspect of the base of the fifth metatarsal.  There is no significant swelling.  Pulses are palpable.  Good capillary refill.  Good ankle ocean with only a small amount of tightness with terminal plantarflexion.   ASSESSMENT  1. Right foot pain   2. Closed displaced fracture of fifth metatarsal bone of right foot with routine healing, subsequent encounter     PLAN:  Pertinent additional documentation may be included in corresponding procedure notes, imaging studies, problem based documentation and patient instructions.  Procedures:  None  Medications:  No orders of the defined types were placed in this encounter.  Discussion/Instructions: No problem-specific Assessment & Plan notes found for this encounter. RICE (Rest, ICE, Compression, Elevation) principles reviewed with the patient. Discussed appropriate use of both heat and ice with the patient today.  Discussed red flag symptoms that warrant earlier emergent evaluation and patient voices understanding. Overall doing significantly better.  We will transition him into a postoperative shoe and discussed the importance of continuing to avoid  potential recurrent injury.    Return in about 2 weeks (around 12/22/2018) for repeat X-rays if any persistent pain.          Gerda Diss, [place] [facility] Physician

## 2018-12-22 ENCOUNTER — Ambulatory Visit: Payer: Medicare Other | Admitting: Sports Medicine

## 2018-12-22 ENCOUNTER — Encounter: Payer: Self-pay | Admitting: Sports Medicine

## 2018-12-22 VITALS — BP 140/86 | HR 97 | Ht 71.0 in | Wt 259.0 lb

## 2018-12-22 DIAGNOSIS — S92351D Displaced fracture of fifth metatarsal bone, right foot, subsequent encounter for fracture with routine healing: Secondary | ICD-10-CM | POA: Diagnosis not present

## 2018-12-22 DIAGNOSIS — M79671 Pain in right foot: Secondary | ICD-10-CM

## 2018-12-22 NOTE — Progress Notes (Signed)
Randall Turner. [name], [place] at [place] - 67 y.o. male MRN [id number]  Date of birth: 05-15-1951  Visit Date:   PCP: Marin Olp, MD   Referred by: Marin Olp, MD   SUBJECTIVE:  Chief Complaint  Patient presents with  . Follow-up    R foot pain - Transitioned to a cast shoe at his last visit    HPI: Patient is here for a two-week follow-up after the initial injury approximately 6 weeks ago.  He has been in a postoperative shoe for the past 2 weeks and reports no pain at this time.  He has been wearing it consistently when ambulatory.  Not having any symptoms of instability.  No significant swelling.  He has been able to his shoe which is a low level of activity to begin with.  REVIEW OF SYSTEMS: Denies fevers, chills, recent weight gain or weight loss.  No night sweats. No significant nighttime awakenings due to this issue.    HISTORY:  Prior history reviewed and updated per electronic medical record.  Social History   Occupational History  . Not on file  Tobacco Use  . Smoking status: Never Smoker  . Smokeless tobacco: Never Used  Substance and Sexual Activity  . Alcohol use: No    Alcohol/week: 0.0 standard drinks  . Drug use: No  . Sexual activity: Not on file   Social History   Social History Narrative   Married 1975. 2 sons. 4 grandkids- all local (21 to 10 in 2018)      [name]   2 years of college.       Hobbies: enjoys watching football with wife [employer], movies, pool in back yard, grandkids.       DATA OBTAINED & REVIEWED:  Recent Labs    01/03/18 1103 06/11/18 1111  HGBA1C 5.6  --   CALCIUM 9.6 9.5  AST 50* 48*  ALT 87* 84*   No problems updated. No specialty comments available.  OBJECTIVE:  VS:  HT:5\' 11"  (180.3 cm)   WT:259 lb (117.5 kg)  BMI:36.14    BP:140/86  HR:97bpm  TEMP: ( )  RESP:93 %   PHYSICAL EXAM: Adult male.  No  acute distress.  Alert and appropriate.  His right foot is overall well aligned without significant swelling.  He has good forefoot motion as well as ankle motion with small amount of crepitation that is nonpainful.  He has no focal tenderness over the base of the fifth metatarsal.  No pain with resisted ankle inversion, eversion, plantarflexion or dorsiflexion.  Strength is 5+/5.   ASSESSMENT   1. Right foot pain   2. Closed displaced fracture of fifth metatarsal bone of right foot with routine healing, subsequent encounter      PLAN:  Pertinent additional documentation may be included in corresponding procedure notes, imaging studies, problem based documentation and patient instructions.  Procedures:  None  Medications:  No orders of the defined types were placed in this encounter.  Discussion/Instructions: No problem-specific Assessment & Plan notes found for this encounter. RICE (Rest, ICE, Compression, Elevation) principles reviewed with the patient. Discussed red flag symptoms that warrant earlier emergent evaluation and patient voices understanding. Activity modifications and the importance of avoiding exacerbating activities (limiting pain to no more than a 4 / 10 during or following activity) recommended and discussed. Overall he is doing quite well. We will have him increase his  activities as tolerated in a normal shoe.  Given he has no focal tenderness today repeat x-rays will be deferred.  If any lack of improvement: consider further diagnostic evaluation with Repeat x-rays but doing well.   No follow-ups on file.          Gerda Diss, [place] [facility] Physician

## 2018-12-29 ENCOUNTER — Other Ambulatory Visit: Payer: Self-pay | Admitting: Family Medicine

## 2019-01-16 ENCOUNTER — Ambulatory Visit (INDEPENDENT_AMBULATORY_CARE_PROVIDER_SITE_OTHER): Payer: Medicare Other | Admitting: Family Medicine

## 2019-01-16 ENCOUNTER — Encounter: Payer: Self-pay | Admitting: Family Medicine

## 2019-01-16 VITALS — BP 138/80 | HR 87 | Temp 97.7°F | Ht 71.0 in | Wt 257.8 lb

## 2019-01-16 DIAGNOSIS — Z23 Encounter for immunization: Secondary | ICD-10-CM

## 2019-01-16 DIAGNOSIS — R739 Hyperglycemia, unspecified: Secondary | ICD-10-CM

## 2019-01-16 DIAGNOSIS — Z79899 Other long term (current) drug therapy: Secondary | ICD-10-CM | POA: Diagnosis not present

## 2019-01-16 DIAGNOSIS — I10 Essential (primary) hypertension: Secondary | ICD-10-CM

## 2019-01-16 DIAGNOSIS — J4520 Mild intermittent asthma, uncomplicated: Secondary | ICD-10-CM

## 2019-01-16 DIAGNOSIS — E785 Hyperlipidemia, unspecified: Secondary | ICD-10-CM | POA: Diagnosis not present

## 2019-01-16 DIAGNOSIS — Z6835 Body mass index (BMI) 35.0-35.9, adult: Secondary | ICD-10-CM

## 2019-01-16 DIAGNOSIS — Z0000 Encounter for general adult medical examination without abnormal findings: Secondary | ICD-10-CM

## 2019-01-16 DIAGNOSIS — Z125 Encounter for screening for malignant neoplasm of prostate: Secondary | ICD-10-CM | POA: Diagnosis not present

## 2019-01-16 LAB — CBC
HCT: 49.5 % (ref 39.0–52.0)
Hemoglobin: 17.1 g/dL — ABNORMAL HIGH (ref 13.0–17.0)
MCHC: 34.6 g/dL (ref 30.0–36.0)
MCV: 89.9 fl (ref 78.0–100.0)
Platelets: 207 10*3/uL (ref 150.0–400.0)
RBC: 5.51 Mil/uL (ref 4.22–5.81)
RDW: 13.5 % (ref 11.5–15.5)
WBC: 6.5 10*3/uL (ref 4.0–10.5)

## 2019-01-16 LAB — COMPREHENSIVE METABOLIC PANEL
ALT: 104 U/L — ABNORMAL HIGH (ref 0–53)
AST: 57 U/L — ABNORMAL HIGH (ref 0–37)
Albumin: 4.7 g/dL (ref 3.5–5.2)
Alkaline Phosphatase: 104 U/L (ref 39–117)
BUN: 18 mg/dL (ref 6–23)
CO2: 26 mEq/L (ref 19–32)
Calcium: 9.9 mg/dL (ref 8.4–10.5)
Chloride: 104 mEq/L (ref 96–112)
Creatinine, Ser: 1.17 mg/dL (ref 0.40–1.50)
GFR: 62.35 mL/min (ref >60.00)
Glucose, Bld: 113 mg/dL — ABNORMAL HIGH (ref 70–99)
Potassium: 4.5 mEq/L (ref 3.5–5.1)
Sodium: 140 mEq/L (ref 135–145)
Total Bilirubin: 1.1 mg/dL (ref 0.2–1.2)
Total Protein: 6.9 g/dL (ref 6.0–8.3)

## 2019-01-16 LAB — LIPID PANEL
Cholesterol: 171 mg/dL (ref 0–200)
HDL: 54.3 mg/dL (ref >39.00)
NonHDL: 117.09
Total CHOL/HDL Ratio: 3
Triglycerides: 222 mg/dL — ABNORMAL HIGH (ref 0.0–149.0)
VLDL: 44.4 mg/dL — ABNORMAL HIGH (ref 0.0–40.0)

## 2019-01-16 LAB — LDL CHOLESTEROL, DIRECT: Direct LDL: 87 mg/dL

## 2019-01-16 LAB — PSA: PSA: 4.16 ng/mL — ABNORMAL HIGH (ref 0.10–4.00)

## 2019-01-16 LAB — VITAMIN B12: Vitamin B-12: 659 pg/mL (ref 211–911)

## 2019-01-16 LAB — HEMOGLOBIN A1C: Hgb A1c MFr Bld: 5.5 % (ref 4.6–6.5)

## 2019-01-16 MED ORDER — ALBUTEROL SULFATE 108 (90 BASE) MCG/ACT IN AEPB: 2.0000 | INHALATION_SPRAY | RESPIRATORY_TRACT | 3 refills | Status: DC | PRN

## 2019-01-16 NOTE — Progress Notes (Signed)
Phone: [phone]   Subjective:  Patient presents today for their annual physical. Chief complaint-noted.   See problem oriented charting- ROS- full  review of systems was completed and negative except for: Frustration over current state of health care and our administration of care for his wife in relation to prior authorization  BMI monitoring- elevated BMI noted: Body mass index is 35.96 kg/m. Encouraged need for healthy eating, regular exercise, weight loss.   BMI Metric Follow Up - 01/16/19 1107      BMI Metric Follow Up-Please document annually   BMI Metric Follow Up  Education provided     Technically with morbid obesity with BMI over 35 with hypertension and hyperlipidemia  The following were reviewed and entered/updated in epic: Past Medical History:  Diagnosis Date  . Allergy   . Asthma    childhood asthma  . GERD (gastroesophageal reflux disease)   . 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 Hyperlipidemia   . Hypertension   . Internal hemorrhoids   . Nephrolithiasis    lithotripsy  . Tubular adenoma of colon    Patient Active Problem List   Diagnosis Date Noted  . 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 Asthma 08/09/2017    Priority: Medium  . Essential hypertension 09/15/2015    Priority: Medium  . Hyperglycemia 07/28/2013    Priority: Medium  . Hyperlipidemia 07/28/2013    Priority: Medium  . Esophageal reflux 07/28/2013    Priority: Medium  . History of adenomatous polyp of colon 08/09/2017    Priority: Low  . Closed fracture of fifth metatarsal bone of right foot 11/24/2018  . Right foot pain 11/24/2018   Past Surgical History:  Procedure Laterality Date  . BUNIONECTOMY Bilateral 2005  . patella removed right knee  1969   after football injury. plan was to grind down back of it but it was broken  . TONSILLECTOMY  1958    . VASECTOMY      Family History  Problem Relation Age of Onset  . Gallstones Mother   . Colon cancer Mother   . Lung cancer Mother        discovered near death  . Heart disease Mother        rhythm and mitral valve prolapse  . Alzheimer's disease Father   . Prostate cancer Father        radiation when younger- survived  . Mesothelioma Father   . Heart disease Maternal Grandmother        died at young age from heart issue unkown  . Cancer Maternal Grandfather   . Heart disease Paternal Grandmother        enlarged heart  . Heart attack Paternal Grandfather        age 43  . Nephrolithiasis Sister   . Diabetes Maternal Aunt   . Pancreatic cancer Neg Hx   . Stomach cancer Neg Hx     Medications- reviewed and updated Current Outpatient Medications  Medication Sig Dispense Refill  . Albuterol Sulfate (PROAIR RESPICLICK) 295 (90 Base) MCG/ACT AEPB Inhale 2 puffs into the lungs every 4 (four) hours as needed. 1 each 3  . aspirin EC 81 MG tablet Take 162 mg by mouth daily.    . cholecalciferol (VITAMIN D) 1000 units tablet Take 1,000 Units by mouth daily.    Marland Kitchen losartan (COZAAR) 50 MG tablet TAKE 1 TABLET BY MOUTH  DAILY 90 tablet 1  . naproxen sodium (ALEVE) 220 MG tablet Take 220 mg by mouth 2 (two) times daily as needed.    Marland Kitchen omeprazole (PRILOSEC) 20 MG capsule TAKE 1 CAPSULE BY MOUTH  ONCE DAILY 90 capsule 3  . OVER THE COUNTER MEDICATION Take 1 tablet by mouth. Mens multivitamin without iron    . pravastatin (PRAVACHOL) 20 MG tablet TAKE 1 TABLET BY MOUTH  DAILY 90 tablet 3   No current facility-administered medications for this visit.     Allergies-reviewed and updated Allergies  Allergen Reactions  . Cortisone     swelling    Social History   Social History Narrative   Married 1975. 2 sons. 4 grandkids- all local (21 to 10 in 2018)      [name]   2 years of college.       Hobbies: enjoys watching football with wife [employer], movies, pool in back yard,  grandkids.    Objective  Objective:  BP 138/80 (BP Location: Left Arm, Patient Position: Sitting, Cuff Size: Large)   Pulse 87   Temp 97.7 F (36.5 C) (Oral)   Ht 5\' 11"  (1.803 m)   Wt 257 lb 12.8 oz (116.9 kg)   SpO2 96%   BMI 35.96 kg/m  Gen: NAD, resting comfortably HEENT: Mucous membranes are moist. Oropharynx normal Neck: no thyromegaly CV: RRR no murmurs rubs or gallops Lungs: CTAB no crackles, wheeze, rhonchi Abdomen: soft/nontender/nondistended/normal bowel sounds. No rebound or guarding.  Obese Ext: no edema Skin: warm, dry Neuro: grossly normal, moves all extremities, PERRLA Rectal: normal tone, diffusely enlarged prostate, no masses or tenderness   Assessment and Plan  68 y.o. male presenting for annual physical.  Health Maintenance counseling: 1. Anticipatory guidance: Patient counseled regarding regular dental exams -q6 months, eye exams -yearly,  avoiding smoking and second hand smoke , limiting alcohol to 2 beverages per day - 0-1 drinks per week.   2. Risk factor reduction:  Advised patient of need for regular exercise and diet rich and fruits and vegetables to reduce risk of heart attack and stroke. Exercise- Foot fracture before thanksgiving set him back- starting back slowly but still sensitive in foot. Diet- discussed portion size reduction.  Unfortunately weight is up 9 pounds from last physical-had lost 10 pounds in prior year.  Wt Readings from Last 3 Encounters:  01/16/19 257 lb 12.8 oz (116.9 kg)  12/22/18 259 lb (117.5 kg)  12/08/18 260 lb 3.2 oz (118 kg)  3. Immunizations/screenings/ancillary studies-Shingrix at the pharmacy recommended  Immunization History  Administered Date(s) Administered  . Influenza Split 10/30/2013  . Influenza, High Dose Seasonal PF 09/11/2018  . Influenza,inj,Quad PF,6+ Mos 08/03/2014, 09/15/2015, 09/13/2016  . Influenza-Unspecified 11/11/2017  . Pneumococcal Conjugate-13 01/03/2018  . Pneumococcal Polysaccharide-23  01/16/2019  . Pneumococcal-Unspecified 11/22/2006  . Tdap 02/18/2007, 05/16/2017  . Zoster 12/06/2013  4. Prostate cancer screening-PSA trending up some last year-we will repeat today.  Rectal exam low risk  Lab Results  Component Value Date   PSA 2.34 01/03/2018   PSA 1.97 03/15/2016   PSA 1.69 08/03/2014   5. Colon cancer screening - 01/23/2017 with 5-year repeat due to adenoma 6. Skin cancer screening- no dermatologist.  Advised regular sunscreen use. Denies worrisome, changing, or new skin lesions- had spot on back that he feels more comfortable about since he saw Dr. Rogers Blocker.  7.  Never smoker  Status of chronic or acute concerns     #Hemochromatosis-follows with [name] S: Ferritin goal  under 100.  Has had mild LFT elevation in the past-monitoring as long as remains under 100. A/P: Suspect stable-update LFTs and ferritin today   #Hypertension S: Controlled on losartan 50 mg. Home #s as low as 117- typically around low 120s A/P:  Stable. Continue current medications.  Home numbers look better than here which is encouraging   #Hyperlipidemia S: Compliant with pravastatin 20 mg. Takes asa 162mg  A/P:  Stable. Continue current medications.  Update lipids   #Hyperglycemia S: CBGs high in the past- he is not sure if they were all fasting though.  A1c not elevated. A/P: Hopefully A1c remains nonelevated-if CBG and A1c not elevated would resort to CBGs only in the future    #Asthma S: No controller.  Sparingly uses albuterol- mainly has to use it around son's cats A/P:  Stable. Continue current medications.       #GERD S: History of Schatzki's ring. On omeprazole 20mg  A/P:  Stable. 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 ([place]) - Plan: CBC, Comprehensive metabolic panel, Iron, TIBC and Ferritin Panel  Essential hypertension  Hyperlipidemia, unspecified hyperlipidemia type - Plan: CBC, Lipid panel, Comprehensive metabolic panel  Mild intermittent asthma without complication  Morbid obesity ([place])  Hyperglycemia - Plan: Hemoglobin A1c  BMI 35.0-35.9,adult  Need for prophylactic vaccination against Streptococcus pneumoniae (pneumococcus) - Plan: Pneumococcal polysaccharide vaccine 23-valent greater than or equal to 2yo subcutaneous/IM  Screening for prostate cancer - Plan: PSA  High risk medication use - Plan: Vitamin B12  Meds ordered this encounter  Medications  . Albuterol Sulfate (PROAIR RESPICLICK) 597 (90 Base) MCG/ACT AEPB    Sig: Inhale 2 puffs into the lungs every 4 (four) hours as needed.    Dispense:  1 each    Refill:  3   Return precautions advised.  Garret Reddish, MD

## 2019-01-16 NOTE — Patient Instructions (Addendum)
Thanks for your time today!  I would love for you to set a goal of losing 10 pounds by follow-up-you have done it before and I believe you can do it again  No changes planned today   Please stop by lab before you go If you have mychart- we will send your results within 3 business days of [place] receiving them.  If abnormal or we want to clarify a result, we will call or mychart you to make sure you receive the message.  If you have questions or concerns or don't hear within 5-7 days, please send [place] a message or call us.

## 2019-01-17 LAB — IRON,TIBC AND FERRITIN PANEL
%SAT: 33 % (calc) (ref 20–48)
Ferritin: 39 ng/mL (ref 24–380)
Iron: 126 ug/dL (ref 50–180)
TIBC: 379 mcg/dL (calc) (ref 250–425)

## 2019-01-20 ENCOUNTER — Other Ambulatory Visit: Payer: Self-pay

## 2019-01-20 ENCOUNTER — Telehealth: Payer: Self-pay | Admitting: Family Medicine

## 2019-01-20 DIAGNOSIS — R972 Elevated prostate specific antigen [PSA]: Secondary | ICD-10-CM

## 2019-01-20 NOTE — Telephone Encounter (Signed)
See note  Copied from [place] [phone]. Topic: Referral - Request for Referral >> Jan 20, 2019 10:46 AM Alfredia Ferguson R wrote: Has patient seen PCP for this complaint? Yes- Referral for which specialty: Urology Preferred provider/office: Urologist off of [address] Reason for referral: Lab results

## 2019-01-20 NOTE — Telephone Encounter (Signed)
Referral placed as requested.

## 2019-03-09 DIAGNOSIS — R3912 Poor urinary stream: Secondary | ICD-10-CM | POA: Diagnosis not present

## 2019-04-07 LAB — PSA: PSA: 3.47

## 2019-04-17 DIAGNOSIS — R3912 Poor urinary stream: Secondary | ICD-10-CM | POA: Diagnosis not present

## 2019-04-23 ENCOUNTER — Encounter: Payer: Self-pay | Admitting: Family Medicine

## 2019-04-27 ENCOUNTER — Other Ambulatory Visit: Payer: Self-pay | Admitting: Family Medicine

## 2019-04-28 ENCOUNTER — Encounter: Payer: Self-pay | Admitting: Family Medicine

## 2019-04-28 DIAGNOSIS — R972 Elevated prostate specific antigen [PSA]: Secondary | ICD-10-CM

## 2019-05-05 ENCOUNTER — Ambulatory Visit
Admission: RE | Admit: 2019-05-05 | Discharge: 2019-05-05 | Disposition: A | Discharge status: Home / Self Care | Payer: Medicare Other | Source: Ambulatory Visit | Attending: Radiation Oncology | Admitting: Radiation Oncology

## 2019-05-05 ENCOUNTER — Ambulatory Visit: Payer: Medicare Other | Admitting: Radiation Oncology

## 2019-05-05 ENCOUNTER — Encounter: Payer: Self-pay | Admitting: Radiation Oncology

## 2019-05-05 ENCOUNTER — Other Ambulatory Visit: Payer: Self-pay

## 2019-05-05 ENCOUNTER — Ambulatory Visit: Payer: Medicare Other

## 2019-05-05 VITALS — Ht 71.0 in | Wt 246.0 lb

## 2019-05-05 DIAGNOSIS — C61 Malignant neoplasm of prostate: Secondary | ICD-10-CM | POA: Insufficient documentation

## 2019-05-05 HISTORY — DX: Malignant neoplasm of prostate: C61

## 2019-05-05 NOTE — Progress Notes (Signed)
GU Location of Tumor / Histology: prostatic adenocarcinoma  If Prostate Cancer, Gleason Score is (3 + 3) and PSA is (3.47) on 04/07/19. Prostate volume: 50.44 grams.   Randall Turner. was a former patient of Dr. Ralene Muskrat due to a history of kidney stones. Patient was referred back by Dr. Garret Reddish (PCP) to Dr. Jeffie Pollock in January 2020 for further evaluation of an elevated PSA of 4.16.   Biopsies of prostate (if applicable) revealed:   Past/Anticipated interventions by urology, if any: prostate biopsy, referral for consideration of radiotherapy  Past/Anticipated interventions by medical oncology, if any: no  Weight changes, if any: no  Bowel/Bladder complaints, if any: IPSS 7. SHIM 25. Reports nocturia x 2. Reports daytime frequency. Reports intermittent and reduced stream. Denies dysuria or hematuria.  Denies urinary leakage or incontinence.   Nausea/Vomiting, if any: no  Pain issues, if any:  no  SAFETY ISSUES:  Prior radiation? no  Pacemaker/ICD? no  Possible current pregnancy? no, male patient  Is the patient on methotrexate? no  Current Complaints / other details:  68 year old male. Married with two children.

## 2019-05-05 NOTE — Progress Notes (Signed)
See progress note under physician encounter. 

## 2019-05-05 NOTE — Progress Notes (Signed)
Radiation Oncology         [phone]) [phone] ________________________________  Initial Outpatient Consultation - Conducted via telephone due to current COVID-19 concerns for limiting patient exposure  Name: Randall Turner. MRN: [id number]  Date: 05/05/2019  DOB: January 28, 1951  MW:[id number], Randall Mars, MD  Irine Seal, MD   REFERRING PHYSICIAN: Irine Seal, MD  DIAGNOSIS: 68 y.o. gentleman with Stage T1c adenocarcinoma of the prostate with Gleason score of 3+3, and PSA of 3.47.    ICD-10-CM   1. Malignant neoplasm of prostate ([place]) C61     HISTORY OF PRESENT ILLNESS: Randall Turner. is a 68 y.o. male with a diagnosis of prostate cancer. He is an established urology patient, previously followed for nephrolithiasis and was noted to have an elevated PSA of 4.16 in January 2020 by his primary care physician, Dr. Garret Reddish.  A prior PSA in 2019 was 2.34.  Accordingly, he was referred for evaluation in urology by Dr. Jeffie Pollock on 03/09/2019, where a digital rectal examination was performed revealing no prostate nodules.  Repeat PSA at that time was 3.42, and a second PSA on 04/07/2019 remained stable but elevated at 3.47. He has a family history of prostate cancer in his father who was diagnosed around age 47 as well.  The patient proceeded to transrectal ultrasound with 12 biopsies of the prostate on 04/13/2019.  The prostate volume measured 50.44 cc.  Out of 12 core biopsies, 2 were positive.  The maximum Gleason score was 3+3, and this was seen in the left base lateral and right base. Oncotype DX results pending.  Biopsies of prostate revealed:   The patient reviewed the biopsy results with his urologist and he has kindly been referred today for discussion of potential radiation treatment options.   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 plan was to grind down back of it but it was broken   TONSILLECTOMY  1958   VASECTOMY      FAMILY HISTORY:  Family History  Problem Relation Age of Onset   Gallstones Mother    Colon cancer Mother    Lung cancer Mother        discovered near death   Heart disease Mother        rhythm and mitral valve prolapse   Alzheimer's disease Father    Prostate cancer Father        radiation when younger- survived   Mesothelioma Father    Heart disease Maternal Grandmother        died at young age from heart issue unkown   Cancer Maternal Grandfather    Heart disease Paternal Grandmother        enlarged heart   Heart attack Paternal Grandfather        age 69   Nephrolithiasis Sister    Diabetes Maternal Aunt    Pancreatic cancer Neg  Hx    Stomach cancer Neg Hx     SOCIAL HISTORY:  Social History   Socioeconomic History   Marital status: Married    Spouse name: Not on file   Number of children: 2   Years of education: Not on file   Highest education level: Not on file  Occupational History    Comment: retired since 2016  [place] resource strain: Not on file   Food insecurity:    Worry: Not on file    Inability: Not on file   Transportation needs:    Medical: Not on file    Non-medical: Not on file  Tobacco Use   Smoking status: Never Smoker   Smokeless tobacco: Never Used  Substance and Sexual Activity   Alcohol use: No    Alcohol/week: 0.0 standard drinks   Drug use: No   Sexual activity: Not on file   Lifestyle   Physical activity:    Days per week: Not on file    Minutes per session: Not on file   Stress: Not on file  Relationships   Social connections:    Talks on phone: Not on file    Gets together: Not on file    Attends religious service: Not on file    Active member of club or organization: Not on file    Attends meetings of clubs or organizations: Not on file    Relationship status: Not on file   Intimate partner violence:    Fear of current or ex partner: Not on file    Emotionally abused: Not on file    Physically abused: Not on file    Forced sexual activity: Not on file  Other Topics Concern   Not on file  Social History Narrative   Married 1975. 2 sons. 4 grandkids- all local (21 to 10 in 2018)      [name]   2 years of college.       Hobbies: enjoys watching football with wife [employer], movies, pool in back yard, grandkids.     ALLERGIES: Cortisone  MEDICATIONS:  Current Outpatient Medications  Medication Sig Dispense Refill   Albuterol Sulfate (PROAIR RESPICLICK) 308 (90 Base) MCG/ACT AEPB Inhale 2 puffs into the lungs every 4 (four) hours as needed. 1 each 3   aspirin EC 81 MG tablet Take 162 mg by mouth daily.     cholecalciferol (VITAMIN D) 1000 units tablet Take 1,000 Units by mouth daily.     losartan (COZAAR) 50 MG tablet TAKE 1 TABLET BY MOUTH  DAILY 90 tablet 1   naproxen sodium (ALEVE) 220 MG tablet Take 220 mg by mouth 2 (two) times daily as needed.     omeprazole (PRILOSEC) 20 MG capsule TAKE 1 CAPSULE BY MOUTH  ONCE DAILY 90 capsule 3   OVER THE COUNTER MEDICATION Take 1 tablet by mouth. Mens multivitamin without iron     pravastatin (PRAVACHOL) 20 MG tablet TAKE 1 TABLET BY MOUTH  DAILY 90 tablet 3   No current facility-administered medications for this encounter.     REVIEW OF SYSTEMS:  On review of systems, the patient reports that he is doing well overall. He denies any chest pain, shortness of breath, cough, fevers,  chills, night sweats, or unintended weight changes. He denies any bowel disturbances, and denies abdominal pain, nausea or vomiting. He denies any new musculoskeletal or joint aches or pains. His IPSS was 7, indicating mild urinary symptoms with daytime frequency, intermittent  and reduced stream, and nocturia x2. He denies dysuria, hematuria, leakage or incontinence. His SHIM was 25, indicating he does not have erectile dysfunction. A complete review of systems is obtained and is otherwise negative.    PHYSICAL EXAM: Unable to assess due to telephone consultation format.  Wt Readings from Last 3 Encounters:  05/05/19 246 lb (111.6 kg)  01/16/19 257 lb 12.8 oz (116.9 kg)  12/22/18 259 lb (117.5 kg)   Temp Readings from Last 3 Encounters:  01/16/19 97.7 F (36.5 C) (Oral)  11/10/18 (!) 97.4 F (36.3 C) (Oral)  07/04/18 97.6 F (36.4 C) (Oral)   BP Readings from Last 3 Encounters:  01/16/19 138/80  12/22/18 140/86  12/08/18 138/88   Pulse Readings from Last 3 Encounters:  01/16/19 87  12/22/18 97  12/08/18 82   Pain Assessment Pain Score: 0-No pain/10   LABORATORY DATA:  Lab Results  Component Value Date   WBC 6.5 01/16/2019   HGB 17.1 (H) 01/16/2019   HCT 49.5 01/16/2019   MCV 89.9 01/16/2019   PLT 207.0 01/16/2019   Lab Results  Component Value Date   NA 140 01/16/2019   K 4.5 01/16/2019   CL 104 01/16/2019   CO2 26 01/16/2019   Lab Results  Component Value Date   ALT 104 (H) 01/16/2019   AST 57 (H) 01/16/2019   ALKPHOS 104 01/16/2019   BILITOT 1.1 01/16/2019     RADIOGRAPHY: No results found.    IMPRESSION/PLAN: This visit was conducted via telephone to spare the patient unnecessary potential exposure in the healthcare setting during the current COVID-19 pandemic. 1. 68 y.o. gentleman with Stage T1c adenocarcinoma of the prostate with Gleason Score of 3+3, and PSA of 3.47. We discussed the patient's workup and outlined the nature of prostate cancer in this  setting. The patient's T stage, Gleason's score, and PSA put him into the low risk group. Accordingly, he is eligible for a variety of potential treatment options including active surveillance, radical prostatectomy, prostate brachytherapy, or 5.5 weeks of daily external radiation. We discussed the available radiation techniques, and focused on the details and logistics of delivery. We discussed and outlined the risks, benefits, short and long-term effects associated with radiotherapy and compared and contrasted these with prostatectomy. We also discussed the role of SpaceOAR in reducing the rectal toxicity associated with radiotherapy.  At the end of the conversation the patient is interested in moving forward with brachytherapy and use of SpaceOAR to reduce rectal toxicity from radiotherapy.  The patient understands that the timing of starting his radiation treatment will depend on progress made in controlling the current COVID-19 pandemic which may delay the scheduling of procedures and/or start of treatment as we move forward (currently anticipating late July). We will remain in close communication with the patient to coordinate his CT simulation prior to treatment in the near future.  The patient appears to have a good understanding of his disease and our recommendations and is comfortable and in agreement with the stated plan.  We will share this information with Dr. Jeffie Pollock and proceed with treatment planning accordingly. Romie Jumper in our office will be working closely with him to coordinate OR scheduling and pre and post procedure appointments.  We will contact the pharmaceutical rep to ensure that [place] is available at the time of procedure.  He will have a prostate MRI following his post-seed CT SIM to confirm appropriate distribution of the [place].  Given current concerns for patient exposure during the COVID-19  pandemic, this encounter was conducted via telephone. The patient was notified in  advance and was offered a [place] meeting to allow for face to face communication but unfortunately reported that he did not have the appropriate resources/technology to support such a visit and instead preferred to proceed with telephone consult. The patient has given verbal consent for this type of encounter. The time spent during this encounter was 60 minutes. The attendants for this meeting include Tyler Pita MD, Ashlyn Bruning PA-C, Herman, patient, Azzan Butler. and his wife. During the encounter, Tyler Pita MD, Ashlyn Bruning PA-C, and scribe, Rae Lips were located at [facility].  Patient, Colman Birdwell. and wife were located at home.    Nicholos Johns, PA-C    Tyler Pita, MD  [facility] Direct Dial: [phone]   Fax: [phone] [email or web address]   Skype   [email or web address]  This document serves as a record of services personally performed by Tyler Pita, MD and Freeman Caldron, PA-C. It was created on their behalf by Rae Lips, a trained medical scribe. The creation of this record is based on the scribe's personal observations and the providers' statements to them. This document has been checked and approved by the attending providers.

## 2019-05-11 ENCOUNTER — Telehealth: Payer: Self-pay | Admitting: Medical Oncology

## 2019-05-11 ENCOUNTER — Encounter: Payer: Self-pay | Admitting: Family Medicine

## 2019-05-11 NOTE — Telephone Encounter (Signed)
Attempted to reach patient to follow up post consult with Dr. Tammi Klippel 5/12. No answer and mailbox is not set up. I will attempt at a later time.

## 2019-05-12 ENCOUNTER — Telehealth: Payer: Self-pay | Admitting: *Deleted

## 2019-05-12 NOTE — Telephone Encounter (Signed)
CALLED PATIENT TO ASK QUESTION, SPOKE WITH PATIENT 

## 2019-05-20 ENCOUNTER — Other Ambulatory Visit: Payer: Self-pay | Admitting: Urology

## 2019-06-01 ENCOUNTER — Telehealth: Payer: Self-pay | Admitting: *Deleted

## 2019-06-01 NOTE — Telephone Encounter (Signed)
CALLED PATIENT TO INFORM OF PRE-SEED APPTS. AND IMPLANT, LVM FOR A RETURN CALL

## 2019-06-02 ENCOUNTER — Telehealth: Payer: Self-pay | Admitting: *Deleted

## 2019-06-02 ENCOUNTER — Other Ambulatory Visit: Payer: Self-pay | Admitting: Urology

## 2019-06-02 DIAGNOSIS — C61 Malignant neoplasm of prostate: Secondary | ICD-10-CM

## 2019-06-02 NOTE — Telephone Encounter (Signed)
Returned patient's phone call, spoke with patient 

## 2019-06-11 ENCOUNTER — Telehealth: Payer: Self-pay | Admitting: *Deleted

## 2019-06-11 NOTE — Telephone Encounter (Signed)
Called patient to inform of pre-seed appts. for 07-16-19 have been moved to the morning of 07-16-19 - arrival time- 8:15 am @ [facility], spoke with patient and he is aware of this and is good with this

## 2019-06-16 ENCOUNTER — Telehealth: Payer: Self-pay

## 2019-06-16 ENCOUNTER — Other Ambulatory Visit: Payer: Self-pay | Admitting: *Deleted

## 2019-06-16 NOTE — Telephone Encounter (Signed)
Called patient to go over pre-screening questions for upcoming appt. Pt has no voicemail. Could not leave message.  

## 2019-06-17 ENCOUNTER — Other Ambulatory Visit: Payer: Self-pay

## 2019-06-17 ENCOUNTER — Inpatient Hospital Stay: Payer: Medicare Other | Attending: Hematology & Oncology

## 2019-06-17 DIAGNOSIS — C61 Malignant neoplasm of prostate: Secondary | ICD-10-CM | POA: Insufficient documentation

## 2019-06-17 LAB — CMP (CANCER CENTER ONLY)
ALT: 62 U/L — ABNORMAL HIGH (ref 0–44)
AST: 40 U/L (ref 15–41)
Albumin: 4.4 g/dL (ref 3.5–5.0)
Alkaline Phosphatase: 105 U/L (ref 38–126)
Anion gap: 11 (ref 5–15)
BUN: 16 mg/dL (ref 8–23)
CO2: 22 mmol/L (ref 22–32)
Calcium: 9.1 mg/dL (ref 8.9–10.3)
Chloride: 108 mmol/L (ref 98–111)
Creatinine: 1.16 mg/dL (ref 0.61–1.24)
GFR, Est AFR Am: >60 mL/min (ref >60)
GFR, Estimated: >60 mL/min (ref >60)
Glucose, Bld: 102 mg/dL — ABNORMAL HIGH (ref 70–99)
Potassium: 4.2 mmol/L (ref 3.5–5.1)
Sodium: 141 mmol/L (ref 135–145)
Total Bilirubin: 1.1 mg/dL (ref 0.3–1.2)
Total Protein: 7 g/dL (ref 6.5–8.1)

## 2019-06-17 LAB — CBC WITH DIFFERENTIAL (CANCER CENTER ONLY)
Abs Immature Granulocytes: 0.01 10*3/uL (ref 0.00–0.07)
Basophils Absolute: 0.1 10*3/uL (ref 0.0–0.1)
Basophils Relative: 1 %
Eosinophils Absolute: 0.3 10*3/uL (ref 0.0–0.5)
Eosinophils Relative: 4 %
HCT: 49.1 % (ref 39.0–52.0)
Hemoglobin: 16.6 g/dL (ref 13.0–17.0)
Immature Granulocytes: 0 %
Lymphocytes Relative: 24 %
Lymphs Abs: 1.4 10*3/uL (ref 0.7–4.0)
MCH: 30.3 pg (ref 26.0–34.0)
MCHC: 33.8 g/dL (ref 30.0–36.0)
MCV: 89.6 fL (ref 80.0–100.0)
Monocytes Absolute: 0.7 10*3/uL (ref 0.1–1.0)
Monocytes Relative: 11 %
Neutro Abs: 3.5 10*3/uL (ref 1.7–7.7)
Neutrophils Relative %: 60 %
Platelet Count: 215 10*3/uL (ref 150–400)
RBC: 5.48 MIL/uL (ref 4.22–5.81)
RDW: 13.1 % (ref 11.5–15.5)
WBC Count: 5.9 10*3/uL (ref 4.0–10.5)
nRBC: 0 % (ref 0.0–0.2)

## 2019-06-17 LAB — IRON AND TIBC
Iron: 125 ug/dL (ref 42–163)
Saturation Ratios: 35 % (ref 20–55)
TIBC: 358 ug/dL (ref 202–409)
UIBC: 232 ug/dL (ref 117–376)

## 2019-06-17 LAB — FERRITIN: Ferritin: 31 ng/mL (ref 24–336)

## 2019-06-19 ENCOUNTER — Encounter: Payer: Self-pay | Admitting: Hematology & Oncology

## 2019-06-19 ENCOUNTER — Inpatient Hospital Stay: Payer: Medicare Other | Admitting: Hematology & Oncology

## 2019-06-19 ENCOUNTER — Other Ambulatory Visit: Payer: Self-pay

## 2019-06-19 DIAGNOSIS — C61 Malignant neoplasm of prostate: Secondary | ICD-10-CM | POA: Diagnosis not present

## 2019-06-19 NOTE — Progress Notes (Signed)
Hematology and Oncology Follow Up Visit  Randall Turner [id number] 1951/05/03 68 y.o. 06/19/2019   Principle Diagnosis:  Hemochromatosis - compound heterozygote for C282Y and H63D  Current Therapy:   Phlebotomy to maintain ferritin less than 100    Interim History:  Randall Turner is here today for follow-up.the big news now is that he has prostate cancer.  It sounds like he has stage I prostate cancer.  His Gleason score was 6.  2/12 biopsies were positive.  There is a family history of prostate cancer.  As such, he is not having seen implants done.  This will be in August.  He broke the fifth metatarsal on his right foot back in November.  He was in a boot and shoe for about 6 weeks.  He has lab done a couple days before we saw him.  His ferritin was 31 with an iron saturation of 35%.  He was not able to go to [place] because of the coronavirus.  He really like to go to [place] to visit family.  He is had no fever.  Has had no cough.  He has had no nausea or vomiting.    Overall, his performance status is ECOG 0.    Medications:  Allergies as of 06/19/2019      Reactions   Cortisone    Topical cortisone caused swelling      Medication List       Accurate as of June 19, 2019  9:06 AM. If you have any questions, ask your nurse or doctor.        Albuterol Sulfate 108 (90 Base) MCG/ACT Aepb Commonly known as: ProAir RespiClick Inhale 2 puffs into the lungs every 4 (four) hours as needed.   aspirin EC 81 MG tablet Take 162 mg by mouth daily.   cholecalciferol 1000 units tablet Commonly known as: VITAMIN D Take 1,000 Units by mouth daily.   losartan 50 MG tablet Commonly known as: COZAAR TAKE 1 TABLET BY MOUTH  DAILY   naproxen sodium 220 MG tablet Commonly known as: ALEVE Take 220 mg by mouth 2 (two) times daily as needed.   omeprazole 20 MG capsule Commonly known as: PRILOSEC TAKE 1 CAPSULE BY MOUTH  ONCE DAILY   OVER THE COUNTER MEDICATION Take 1  tablet by mouth. Mens multivitamin without iron   pravastatin 20 MG tablet Commonly known as: PRAVACHOL TAKE 1 TABLET BY MOUTH  DAILY       Allergies:  Allergies  Allergen Reactions  . Cortisone     Topical cortisone caused swelling    Past Medical History, Surgical history, Social history, and Family History were reviewed and updated.  Review of Systems: Review of Systems  Constitutional: Negative.   HENT: Negative.   Eyes: Negative.   Respiratory: Negative.   Cardiovascular: Negative.   Gastrointestinal: Negative.   Genitourinary: Negative.   Musculoskeletal: Negative.   Skin: Negative.   Neurological: Negative.   Endo/Heme/Allergies: Negative.   Psychiatric/Behavioral: Negative.       Physical Exam:  weight is 247 lb 8 oz (112.3 kg). His oral temperature is 97.6 F (36.4 C). His blood pressure is 124/86 and his pulse is 81. His respiration is 16 and oxygen saturation is 98%.   Wt Readings from Last 3 Encounters:  06/19/19 247 lb 8 oz (112.3 kg)  05/05/19 246 lb (111.6 kg)  01/16/19 257 lb 12.8 oz (116.9 kg)    Physical Exam Vitals signs reviewed.  HENT:  Head: Normocephalic and atraumatic.  Eyes:     Pupils: Pupils are equal, round, and reactive to light.  Neck:     Musculoskeletal: Normal range of motion.  Cardiovascular:     Rate and Rhythm: Normal rate and regular rhythm.     Heart sounds: Normal heart sounds.  Pulmonary:     Effort: Pulmonary effort is normal.     Breath sounds: Normal breath sounds.  Abdominal:     General: Bowel sounds are normal.     Palpations: Abdomen is soft.  Musculoskeletal: Normal range of motion.        General: No tenderness or deformity.  Lymphadenopathy:     Cervical: No cervical adenopathy.  Skin:    General: Skin is warm and dry.     Findings: No erythema or rash.  Neurological:     Mental Status: He is alert and oriented to person, place, and time.  Psychiatric:        Behavior: Behavior normal.         Thought Content: Thought content normal.        Judgment: Judgment normal.       Lab Results  Component Value Date   WBC 5.9 06/17/2019   HGB 16.6 06/17/2019   HCT 49.1 06/17/2019   MCV 89.6 06/17/2019   PLT 215 06/17/2019   Lab Results  Component Value Date   FERRITIN 31 06/17/2019   IRON 125 06/17/2019   TIBC 358 06/17/2019   UIBC 232 06/17/2019   IRONPCTSAT 35 06/17/2019   Lab Results  Component Value Date   RBC 5.48 06/17/2019   No results found for: KPAFRELGTCHN, LAMBDASER, KAPLAMBRATIO No results found for: IGGSERUM, IGA, IGMSERUM No results found for: Odetta Pink, SPEI   Chemistry      Component Value Date/Time   NA 141 06/17/2019 1117   NA 141 06/11/2017 1401   K 4.2 06/17/2019 1117   K 4.3 06/11/2017 1401   CL 108 06/17/2019 1117   CO2 22 06/17/2019 1117   CO2 25 06/11/2017 1401   BUN 16 06/17/2019 1117   BUN 18.8 06/11/2017 1401   CREATININE 1.16 06/17/2019 1117   CREATININE 1.2 06/11/2017 1401      Component Value Date/Time   CALCIUM 9.1 06/17/2019 1117   CALCIUM 10.1 06/11/2017 1401   ALKPHOS 105 06/17/2019 1117   ALKPHOS 122 06/11/2017 1401   AST 40 06/17/2019 1117   AST 32 06/11/2017 1401   ALT 62 (H) 06/17/2019 1117   ALT 53 06/11/2017 1401   BILITOT 1.1 06/17/2019 1117   BILITOT 0.89 06/11/2017 1401     Impression and Plan: Randall Turner is a 68 yo gentleman with hemochromatosis - compound heterozygous. His last phlebotomy was in December of 2016.  From my point of view, everything looks quite good. We do not have to phlebotomize him.  I do not see any issues with respect to the prostate cancer.  Again no other family history of prostate cancer.  His PSA was only 4.7.  I think we can see him back in 1 year. I do not see anything that suggests that the hemochromatosis will be a problem.   Volanda Napoleon, MD 6/26/20209:06 AM

## 2019-07-15 ENCOUNTER — Telehealth: Payer: Self-pay | Admitting: *Deleted

## 2019-07-15 NOTE — Telephone Encounter (Signed)
CALLED PATIENT TO REMIND OF PRE-SEED APPTS. FOR 07-16-19, SPOKE WITH PATIENT AND HE IS AWARE OF THESE APPTS. 

## 2019-07-16 ENCOUNTER — Ambulatory Visit (HOSPITAL_COMMUNITY)
Admission: RE | Admit: 2019-07-16 | Discharge: 2019-07-16 | Disposition: A | Discharge status: Home / Self Care | Payer: Medicare Other | Source: Ambulatory Visit | Attending: Urology | Admitting: Urology

## 2019-07-16 ENCOUNTER — Ambulatory Visit
Admission: RE | Admit: 2019-07-16 | Discharge: 2019-07-16 | Disposition: A | Discharge status: Home / Self Care | Payer: Medicare Other | Source: Ambulatory Visit | Attending: Radiation Oncology | Admitting: Radiation Oncology

## 2019-07-16 ENCOUNTER — Ambulatory Visit
Admission: RE | Admit: 2019-07-16 | Discharge: 2019-07-16 | Disposition: A | Discharge status: Home / Self Care | Payer: Medicare Other | Source: Ambulatory Visit | Attending: Urology | Admitting: Urology

## 2019-07-16 ENCOUNTER — Encounter (HOSPITAL_COMMUNITY)
Admission: RE | Admit: 2019-07-16 | Discharge: 2019-07-16 | Disposition: A | Discharge status: Home / Self Care | Payer: Medicare Other | Source: Ambulatory Visit | Attending: Urology | Admitting: Urology

## 2019-07-16 ENCOUNTER — Other Ambulatory Visit: Payer: Self-pay

## 2019-07-16 ENCOUNTER — Other Ambulatory Visit (HOSPITAL_COMMUNITY): Payer: Medicare Other

## 2019-07-16 DIAGNOSIS — C61 Malignant neoplasm of prostate: Secondary | ICD-10-CM | POA: Insufficient documentation

## 2019-07-16 DIAGNOSIS — Z01818 Encounter for other preprocedural examination: Secondary | ICD-10-CM | POA: Diagnosis not present

## 2019-07-16 NOTE — Progress Notes (Signed)
  Radiation Oncology         [phone] ________________________________  Name: Randall Turner. MRN: [id number]  Date: 07/16/2019  DOB: 04-18-1951  SIMULATION AND TREATMENT PLANNING NOTE PUBIC ARCH STUDY  [id number], Brayton Mars, MD  Marin Olp, MD  DIAGNOSIS: 68 y.o. gentleman with Stage T1c adenocarcinoma of the prostate with Gleason score of 3+3, and PSA of 3.47     ICD-10-CM   1. Malignant neoplasm of prostate ([place])  C61     COMPLEX SIMULATION:  The patient presented today for evaluation for possible prostate seed implant. He was brought to the radiation planning suite and placed supine on the CT couch. A 3-dimensional image study set was obtained in upload to the planning computer. There, on each axial slice, I contoured the prostate gland. Then, using three-dimensional radiation planning tools I reconstructed the prostate in view of the structures from the transperineal needle pathway to assess for possible pubic arch interference. In doing so, I did not appreciate any pubic arch interference. Also, the patient's prostate volume was estimated based on the drawn structure. The volume was 50 cc.  TRUS was 50.44 cc. Given the pubic arch appearance and prostate volume, patient remains a good candidate to proceed with prostate seed implant. Today, he freely provided informed written consent to proceed.    PLAN: The patient will undergo prostate seed implant.   ________________________________  Sheral Apley. Tammi Klippel, M.D.

## 2019-07-17 ENCOUNTER — Encounter: Payer: Self-pay | Admitting: Family Medicine

## 2019-07-17 ENCOUNTER — Ambulatory Visit (INDEPENDENT_AMBULATORY_CARE_PROVIDER_SITE_OTHER): Payer: Medicare Other | Admitting: Family Medicine

## 2019-07-17 VITALS — BP 120/70 | HR 75 | Temp 98.3°F | Ht 71.0 in | Wt 242.6 lb

## 2019-07-17 DIAGNOSIS — J4520 Mild intermittent asthma, uncomplicated: Secondary | ICD-10-CM | POA: Diagnosis not present

## 2019-07-17 DIAGNOSIS — E785 Hyperlipidemia, unspecified: Secondary | ICD-10-CM | POA: Diagnosis not present

## 2019-07-17 DIAGNOSIS — R739 Hyperglycemia, unspecified: Secondary | ICD-10-CM

## 2019-07-17 DIAGNOSIS — C61 Malignant neoplasm of prostate: Secondary | ICD-10-CM

## 2019-07-17 DIAGNOSIS — E669 Obesity, unspecified: Secondary | ICD-10-CM | POA: Diagnosis not present

## 2019-07-17 DIAGNOSIS — R945 Abnormal results of liver function studies: Secondary | ICD-10-CM

## 2019-07-17 DIAGNOSIS — R7989 Other specified abnormal findings of blood chemistry: Secondary | ICD-10-CM | POA: Insufficient documentation

## 2019-07-17 DIAGNOSIS — I10 Essential (primary) hypertension: Secondary | ICD-10-CM | POA: Diagnosis not present

## 2019-07-17 NOTE — Progress Notes (Signed)
Phone [phone]   Subjective:  Randall Hitchner. is a 68 y.o. year old very pleasant male patient who presents for/with See problem oriented charting Chief Complaint  Patient presents with  . Hypertension   ROS-no fever/chills/sore throat/cough reported.  Past Medical History-  Patient Active Problem List   Diagnosis Date Noted  . Malignant neoplasm of prostate ([place]) 05/05/2019    Priority: High  . 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 Elevated LFTs 07/17/2019    Priority: Medium  . Asthma 08/09/2017    Priority: Medium  . Essential hypertension 09/15/2015    Priority: Medium  . Hyperglycemia 07/28/2013    Priority: Medium  . Hyperlipidemia 07/28/2013    Priority: Medium  . Esophageal reflux 07/28/2013    Priority: Medium  . History of adenomatous polyp of colon 08/09/2017    Priority: Low  . Closed fracture of fifth metatarsal bone of right foot 11/24/2018  . Right foot pain 11/24/2018   Medications- reviewed and updated Current Outpatient Medications  Medication Sig Dispense Refill  . Albuterol Sulfate (PROAIR RESPICLICK) 458 (90 Base) MCG/ACT AEPB Inhale 2 puffs into the lungs every 4 (four) hours as needed. 1 each 3  . aspirin EC 81 MG tablet Take 162 mg by mouth daily.    Marland Kitchen losartan (COZAAR) 50 MG tablet TAKE 1 TABLET BY MOUTH  DAILY 90 tablet 1  . naproxen sodium (ALEVE) 220 MG tablet Take 220 mg by mouth 2 (two) times daily as needed.    Marland Kitchen omeprazole (PRILOSEC) 20 MG capsule TAKE 1 CAPSULE BY MOUTH  ONCE DAILY 90 capsule 3  . OVER THE COUNTER MEDICATION Take 1 tablet by mouth. Mens multivitamin without iron    . pravastatin (PRAVACHOL) 20 MG tablet TAKE 1 TABLET BY MOUTH  DAILY 90 tablet 3   No current facility-administered medications for this visit.      Objective:  BP 120/70   Pulse 75   Temp 98.3 F (36.8 C) (Oral)   Ht 5\' 11"  (1.803 m)   Wt 242 lb 9.6 oz (110 kg)    SpO2 95%   BMI 33.84 kg/m  Gen: NAD, resting comfortably CV: RRR no murmurs rubs or gallops Lungs: CTAB no crackles, wheeze, rhonchi Abdomen: soft/nontender/nondistended/normal bowel sounds.  Skin: warm, dry    Assessment and Plan   #hypertension S: controlled on Losartan 50 mg daily.  BP Readings from Last 3 Encounters:  07/17/19 120/70  07/16/19 (!) 141/89  06/19/19 124/86  A/P:  Stable. Continue current medications.    #hyperlipidemia S: Previously controlled on Pravastatin 20 mg daily. Also taking Aspirin 81 mg 2 tablets daily.  Lab Results  Component Value Date   CHOL 171 01/16/2019   HDL 54.30 01/16/2019   LDLCALC 99 05/16/2017   LDLDIRECT 87.0 01/16/2019   TRIG 222.0 (H) 01/16/2019   CHOLHDL 3 01/16/2019   A/P: Suspect well-controlled other than triglycerides-continue to work on weight loss to get this down.  # Asthma S:Has Albuterol inhaler to use prn.  No recent issues  A/P:  Stable. Continue current medications.  Mild intermittent good control  # Elevated PSA/Prostate Cancer S:patient has opted for radioactive seed implants with Dr. Jeffie Pollock. Radiologist Dr. Tammi Klippel.   Lab Results  Component Value Date   PSA 3.47 04/07/2019   PSA 4.16 (H) 01/16/2019   PSA 2.34 01/03/2018  A/P: he is not apprehensive about procedure- he is ready to proceed forward.    #  Obesity/Hyperglycemia/fatty liver-suspected based off elevated LFTs S:down 15 lbs in last 6 months . Started doing [employer]- thinks hes eating better as a result. Has tried to increase his fiber intake including metamucil- better BMs with this.   With weight loss- fasting Sugar down from 113, to 102,. LFTs improveed and near normalized Wt Readings from Last 3 Encounters:  07/17/19 242 lb 9.6 oz (110 kg)  06/19/19 247 lb 8 oz (112.3 kg)  05/05/19 246 lb (111.6 kg)  A/P: Congratulated patient on weight loss.  Seems to be helping fasting blood sugars.  Liver function test improving- suggesting  probability this is fatty liver.  Ferritin has been well controlled so doubt LFT elevation is related to hemochromatosis.  Repeat LFTs at follow-up  #Hemochromatosis-has close follow-up with Dr. Clemens Catholic.  Last ferritin well-controlled- he will continue follow-up  Recommended follow up: Already scheduled for 23-month physical Future Appointments  Date Time Provider [place]  01/20/2020  9:40 AM Marin Olp, MD [facility]  06/15/2020 11:00 AM [facility] ONLY [facility] None  06/17/2020  9:00 AM [place], Rudell Cobb, MD [facility] None    Lab/Order associations:   ICD-10-CM   1. Essential hypertension  I10   2. Elevated LFTs  R94.5   3. Malignant neoplasm of prostate ([place])  C61     No orders of the defined types were placed in this encounter.   Return precautions advised.  Garret Reddish, MD

## 2019-07-17 NOTE — Patient Instructions (Addendum)
I certainly wish you the best with your upcoming procedure  Congrats on the weight loss- keep up the good work!   No changes today  Schedule physical in 6 months

## 2019-08-05 ENCOUNTER — Telehealth: Payer: Self-pay | Admitting: *Deleted

## 2019-08-05 NOTE — Telephone Encounter (Signed)
CALLED PATIENT TO REMIND OF LAB AND COVID- 19 TESTING FOR 08-07-19, SPOKE WITH PATIENT AND HE IS AWARE OF THESE APPTS.

## 2019-08-07 ENCOUNTER — Other Ambulatory Visit: Payer: Self-pay

## 2019-08-07 ENCOUNTER — Encounter (HOSPITAL_COMMUNITY)
Admission: RE | Admit: 2019-08-07 | Discharge: 2019-08-07 | Disposition: A | Discharge status: Home / Self Care | Payer: Medicare Other | Source: Ambulatory Visit | Attending: Urology | Admitting: Urology

## 2019-08-07 ENCOUNTER — Other Ambulatory Visit (HOSPITAL_COMMUNITY)
Admission: RE | Admit: 2019-08-07 | Discharge: 2019-08-07 | Disposition: A | Discharge status: Home / Self Care | Payer: Medicare Other | Source: Ambulatory Visit | Attending: Urology | Admitting: Urology

## 2019-08-07 ENCOUNTER — Encounter (HOSPITAL_BASED_OUTPATIENT_CLINIC_OR_DEPARTMENT_OTHER): Payer: Self-pay | Admitting: *Deleted

## 2019-08-07 DIAGNOSIS — Z7982 Long term (current) use of aspirin: Secondary | ICD-10-CM | POA: Diagnosis not present

## 2019-08-07 DIAGNOSIS — K219 Gastro-esophageal reflux disease without esophagitis: Secondary | ICD-10-CM | POA: Diagnosis not present

## 2019-08-07 DIAGNOSIS — R351 Nocturia: Secondary | ICD-10-CM | POA: Diagnosis not present

## 2019-08-07 DIAGNOSIS — Z20828 Contact with and (suspected) exposure to other viral communicable diseases: Secondary | ICD-10-CM | POA: Insufficient documentation

## 2019-08-07 DIAGNOSIS — Z8042 Family history of malignant neoplasm of prostate: Secondary | ICD-10-CM | POA: Diagnosis not present

## 2019-08-07 DIAGNOSIS — R3912 Poor urinary stream: Secondary | ICD-10-CM | POA: Diagnosis not present

## 2019-08-07 DIAGNOSIS — N200 Calculus of kidney: Secondary | ICD-10-CM | POA: Diagnosis not present

## 2019-08-07 DIAGNOSIS — Z01812 Encounter for preprocedural laboratory examination: Secondary | ICD-10-CM | POA: Insufficient documentation

## 2019-08-07 DIAGNOSIS — N401 Enlarged prostate with lower urinary tract symptoms: Secondary | ICD-10-CM | POA: Diagnosis not present

## 2019-08-07 DIAGNOSIS — Z79899 Other long term (current) drug therapy: Secondary | ICD-10-CM | POA: Diagnosis not present

## 2019-08-07 DIAGNOSIS — C61 Malignant neoplasm of prostate: Secondary | ICD-10-CM | POA: Diagnosis present

## 2019-08-07 DIAGNOSIS — E7800 Pure hypercholesterolemia, unspecified: Secondary | ICD-10-CM | POA: Diagnosis not present

## 2019-08-07 DIAGNOSIS — Z791 Long term (current) use of non-steroidal anti-inflammatories (NSAID): Secondary | ICD-10-CM | POA: Diagnosis not present

## 2019-08-07 DIAGNOSIS — I10 Essential (primary) hypertension: Secondary | ICD-10-CM | POA: Diagnosis not present

## 2019-08-07 DIAGNOSIS — R350 Frequency of micturition: Secondary | ICD-10-CM | POA: Diagnosis not present

## 2019-08-07 LAB — CBC
HCT: 47.1 % (ref 39.0–52.0)
Hemoglobin: 16.1 g/dL (ref 13.0–17.0)
MCH: 31 pg (ref 26.0–34.0)
MCHC: 34.2 g/dL (ref 30.0–36.0)
MCV: 90.8 fL (ref 80.0–100.0)
Platelets: 199 10*3/uL (ref 150–400)
RBC: 5.19 MIL/uL (ref 4.22–5.81)
RDW: 13.3 % (ref 11.5–15.5)
WBC: 5.7 10*3/uL (ref 4.0–10.5)
nRBC: 0 % (ref 0.0–0.2)

## 2019-08-07 LAB — COMPREHENSIVE METABOLIC PANEL
ALT: 56 U/L — ABNORMAL HIGH (ref 0–44)
AST: 39 U/L (ref 15–41)
Albumin: 4.5 g/dL (ref 3.5–5.0)
Alkaline Phosphatase: 98 U/L (ref 38–126)
Anion gap: 10 (ref 5–15)
BUN: 18 mg/dL (ref 8–23)
CO2: 23 mmol/L (ref 22–32)
Calcium: 9.1 mg/dL (ref 8.9–10.3)
Chloride: 106 mmol/L (ref 98–111)
Creatinine, Ser: 1.14 mg/dL (ref 0.61–1.24)
GFR calc Af Amer: >60 mL/min (ref >60)
GFR calc non Af Amer: >60 mL/min (ref >60)
Glucose, Bld: 111 mg/dL — ABNORMAL HIGH (ref 70–99)
Potassium: 3.9 mmol/L (ref 3.5–5.1)
Sodium: 139 mmol/L (ref 135–145)
Total Bilirubin: 1.3 mg/dL — ABNORMAL HIGH (ref 0.3–1.2)
Total Protein: 7.2 g/dL (ref 6.5–8.1)

## 2019-08-07 LAB — PROTIME-INR
INR: 1 (ref 0.8–1.2)
Prothrombin Time: 13.3 seconds (ref 11.4–15.2)

## 2019-08-07 LAB — APTT: aPTT: 30 seconds (ref 24–36)

## 2019-08-07 LAB — SARS CORONAVIRUS 2 (TAT 6-24 HRS): SARS Coronavirus 2: NEGATIVE

## 2019-08-07 NOTE — Progress Notes (Signed)
Spoke w/ pt via phone for pre-op interview.  Npo after mn.  Arrive at [phone].  Current lab results 08-07-2019 (cbc, cmp, pt/ptt) in chart and epic.  Covid test done today.  Current ekg/cxr in chart and epic.  Will take pravastatin and prilosec am dos w/ sips of water.  Pt verbalized understanding to do one fleet enema am dos.

## 2019-08-10 ENCOUNTER — Telehealth: Payer: Self-pay | Admitting: *Deleted

## 2019-08-10 NOTE — Anesthesia Preprocedure Evaluation (Addendum)
Anesthesia Evaluation  Patient identified by MRN, date of birth, ID band Patient awake    Reviewed: Allergy & Precautions, NPO status , Patient's Chart, lab work & pertinent test results  History of Anesthesia Complications (+) Family history of anesthesia reaction  Airway Mallampati: II  TM Distance: >3 FB Neck ROM: Full    Dental no notable dental hx. (+) Dental Advisory Given   Pulmonary asthma ,    Pulmonary exam normal breath sounds clear to auscultation       Cardiovascular hypertension, Normal cardiovascular exam Rhythm:Regular Rate:Normal     Neuro/Psych negative neurological ROS  negative psych ROS   GI/Hepatic negative GI ROS, Neg liver ROS, hiatal hernia, GERD  ,  Endo/Other  negative endocrine ROS  Renal/GU negative Renal ROS  negative genitourinary   Musculoskeletal  (+) Arthritis ,   Abdominal (+) + obese,   Peds negative pediatric ROS (+)  Hematology negative hematology ROS (+)   Anesthesia Other Findings   Reproductive/Obstetrics negative OB ROS                            Anesthesia Physical Anesthesia Plan  ASA: III  Anesthesia Plan: General   Post-op Pain Management:    Induction: Intravenous  PONV Risk Score and Plan: 4 or greater and Ondansetron, Dexamethasone and Treatment may vary due to age or medical condition  Airway Management Planned: LMA and Oral ETT  Additional Equipment:   Intra-op Plan:   Post-operative Plan: Extubation in OR  Informed Consent: I have reviewed the patients History and Physical, chart, labs and discussed the procedure including the risks, benefits and alternatives for the proposed anesthesia with the patient or authorized representative who has indicated his/her understanding and acceptance.     Dental advisory given  Plan Discussed with: CRNA  Anesthesia Plan Comments:         Anesthesia Quick Evaluation

## 2019-08-10 NOTE — Telephone Encounter (Signed)
CALLED PATIENT TO REMIND OF PROCEDURE FOR 08-11-19, SPOKE WITH PATIENT AND HE IS AWARE OF THIS PROCEDURE.

## 2019-08-10 NOTE — H&P (Signed)
CC/HPI: Pt presents today for pre-operative history and physical exam in anticipation of placement of brachytherapy seeds and space oar by Dr. Jeffie Pollock on 08/11/19. He is doing well and is without complaint.   Pt denies F/C, HA, CP, SOB, N/V, diarrhea/constipation, back pain, flank pain, hematuria, and dysuria.   HX:     CC/HPI: I have prostate cancer.     04/17/19: Randall Turner is seen by telehealth following a prostate biopsy for a further rise in the PSA to 3.47. His prostate was 50.44 ml. He was found to have 2 cores of Gleason 6 disease with 40% at the left base laterally and 10% at the right base medially. He has very low risk T1c Nx Mx disease.   [facility] nomogram predicts 68% probability of OCD, 32% ECE, 1% LNI and 1% SVI. His prior IPSS was 15.   Pre biopsy history. Randall Turner is a former patient with a history of stones who is sent back in consultation by Dr. Yong Channel for an elevated PSA that is up to 4.16 on 01/16/19. His PSA was 1.69 in 2017, 1.97 in 2018 and 2.34 in 2019. He has noted some progressive LUTS over the past 5 years with an IPSS of 15. He has nocturia x2 and daytime frequency with some intermittency and reduced stream. He has had no hematuria or dysuria. His father had prostate cancer at about 65 and was treated with radiation. He has normal erectile function. He is not sure whether he had an ejaculation prior to the PSA test.    ALLERGIES: Cortisone - Swelling     Notes: topical cortisone only; has never had a cortisone shot   MEDICATIONS: Aspirin Ec 81 mg tablet, delayed release 0 Oral  Flaxseed Oil  Losartan Potassium 50 mg tablet  Lysine 1,000 mg tablet  Multiple Vitamin  Naproxen  Omega 3  Omeprazole 20 mg tablet, delayed release Oral  Pravastatin Sodium     GU PSH: ESWL - 2015 Prostate Needle Biopsy - 04/13/2019 Vasectomy - 2015       PSH Notes: Lithotripsy, Surgery Of Male Genitalia Vasectomy, Tonsillectomy, Knee Surgery, Foot Surgery   knee surgery  bunion surgery      NON-GU PSH: Remove Tonsils - 2015 Surgical Pathology, Gross And Microscopic Examination For Prostate Needle - 04/13/2019     GU PMH: Prostate Cancer, T1c Nx Mx very low risk prostate cancer but a 3 year PSADT. I discussed options as noted below. He is most interested in seeds but would like to have an Oncotype Dx done in order to better decide about active surveillance vs treatment. I will have him see Dr. Tammi Klippel. I did discuss SpaceOAR with the radiation options. - 04/17/2019 Elevated PSA - 04/13/2019, I am going to repeat a PSA today and if it remains elevated, he will return for a prostate [place] and biopsy. I have reviewed the risks of bleeding, infection and voiding difficulty and given him a Levaquin script., - 03/09/2019 BPH w/LUTS, He has progressive LUTS and we can evaluate that once we have evaluated him for the PSA. - 03/09/2019 Weak Urinary Stream - 03/09/2019 Renal calculus, Calculus of kidney - 2015      PMH Notes: hiatal hernia   NON-GU PMH: Encounter for general adult medical examination without abnormal findings, Encounter for preventive health examination - 2015 Personal history of other diseases of the digestive system, History of esophageal reflux - 2015 Personal history of other diseases of the respiratory system, History of asthma - 2015 GERD Hypercholesterolemia Hypertension  FAMILY HISTORY: Kidney Stones - Runs In Family Mesothelioma - Mother Prostate Cancer - Father   SOCIAL HISTORY: Marital Status: Married Preferred Language: English; Race: White Current Smoking Status: Patient has never smoked.   Tobacco Use Assessment Completed: Used Tobacco in last 30 days? Has never drank.  Does not use drugs. Drinks 3 caffeinated drinks per day. Has not had a blood transfusion. Patient's occupation is/was retired.     Notes: 2 sons   REVIEW OF SYSTEMS:    GU Review Male:   Patient denies frequent urination, hard to postpone urination, burning/ pain with  urination, get up at night to urinate, leakage of urine, stream starts and stops, trouble starting your stream, have to strain to urinate , erection problems, and penile pain.  Gastrointestinal (Upper):   Patient denies nausea, vomiting, and indigestion/ heartburn.  Gastrointestinal (Lower):   Patient denies diarrhea and constipation.  Constitutional:   Patient denies fever, night sweats, weight loss, and fatigue.  Skin:   Patient denies skin rash/ lesion and itching.  Eyes:   Patient denies blurred vision and double vision.  Ears/ Nose/ Throat:   Patient denies sore throat and sinus problems.  Hematologic/Lymphatic:   Patient denies swollen glands and easy bruising.  Cardiovascular:   Patient denies leg swelling and chest pains.  Respiratory:   Patient denies cough and shortness of breath.  Endocrine:   Patient denies excessive thirst.  Musculoskeletal:   Patient denies back pain and joint pain.  Neurological:   Patient denies headaches and dizziness.  Psychologic:   Patient denies depression and anxiety.   VITAL SIGNS:      07/21/2019 01:05 PM  Weight 240 lb / 108.86 kg  Height 71 in / 180.34 cm  BP 129/84 mmHg  Pulse 82 /min  Temperature 98.0 F / 36.6 C  BMI 33.5 kg/m   MULTI-SYSTEM PHYSICAL EXAMINATION:    Constitutional: Well-nourished. No physical deformities. Normally developed. Good grooming.  Neck: Neck symmetrical, not swollen. Normal tracheal position.  Respiratory: Normal breath sounds. No labored breathing, no use of accessory muscles.   Cardiovascular: Regular rate and rhythm. No murmur, no gallop.   Lymphatic: No enlargement of neck, axillae, groin.  Skin: No paleness, no jaundice, no cyanosis. No lesion, no ulcer, no rash.  Neurologic / Psychiatric: Oriented to time, oriented to place, oriented to person. No depression, no anxiety, no agitation.  Gastrointestinal: No mass, no tenderness, no rigidity, non obese abdomen.  Eyes: Normal conjunctivae. Normal eyelids.   Ears, Nose, Mouth, and Throat: Left ear no scars, no lesions, no masses. Right ear no scars, no lesions, no masses. Nose no scars, no lesions, no masses. Normal hearing. Normal lips.  Musculoskeletal: Normal gait and station of head and neck.     PAST DATA REVIEWED:  Source Of History:  Patient  Records Review:   Previous Patient Records  Urine Test Review:   Urinalysis   07/21/19  Urinalysis  Urine Appearance Clear   Urine Color Yellow   Urine Glucose Neg mg/dL  Urine Bilirubin Neg mg/dL  Urine Ketones Neg mg/dL  Urine Specific Gravity 1.010   Urine Blood Neg ery/uL  Urine pH <=5.0   Urine Protein Neg mg/dL  Urine Urobilinogen 0.2 mg/dL  Urine Nitrites Neg   Urine Leukocyte Esterase Neg leu/uL   PROCEDURES:          Urinalysis - 81003 Dipstick Dipstick Cont'd  Color: Yellow Bilirubin: Neg mg/dL  Appearance: Clear Ketones: Neg mg/dL  Specific Gravity: 1.010 Blood: Neg  ery/uL  pH: <=5.0 Protein: Neg mg/dL  Glucose: Neg mg/dL Urobilinogen: 0.2 mg/dL    Nitrites: Neg    Leukocyte Esterase: Neg leu/uL    ASSESSMENT:      ICD-10 Details  1 GU:   Prostate Cancer - C61    PLAN:           Schedule Return Visit/Planned Activity: Keep Scheduled Appointment - Schedule Surgery          Document Letter(s):  Created for Patient: Clinical Summary         Notes:   There are no changes in the patients history or physical exam since last evaluation by Dr. Jeffie Pollock. Pt is scheduled to undergo brachytherapy and space oar on 08/11/19.    All pt's questions were answered to the best of my ability.          Next Appointment:      Next Appointment: 08/11/2019 07:30 AM    Appointment Type: Surgery     Location: [facility] [phone]    Provider: Irine Seal, M.D.    Reason for Visit: [place]

## 2019-08-11 ENCOUNTER — Encounter (HOSPITAL_BASED_OUTPATIENT_CLINIC_OR_DEPARTMENT_OTHER)
Admission: RE | Disposition: A | Discharge status: Home / Self Care | Payer: Self-pay | Source: Home / Self Care | Attending: Urology

## 2019-08-11 ENCOUNTER — Ambulatory Visit (HOSPITAL_BASED_OUTPATIENT_CLINIC_OR_DEPARTMENT_OTHER)
Admission: RE | Admit: 2019-08-11 | Discharge: 2019-08-11 | Disposition: A | Discharge status: Home / Self Care | Payer: Medicare Other | Attending: Urology | Admitting: Urology

## 2019-08-11 ENCOUNTER — Other Ambulatory Visit: Payer: Self-pay

## 2019-08-11 ENCOUNTER — Ambulatory Visit (HOSPITAL_BASED_OUTPATIENT_CLINIC_OR_DEPARTMENT_OTHER): Payer: Medicare Other | Admitting: Anesthesiology

## 2019-08-11 ENCOUNTER — Ambulatory Visit (HOSPITAL_COMMUNITY): Payer: Medicare Other

## 2019-08-11 ENCOUNTER — Ambulatory Visit (HOSPITAL_BASED_OUTPATIENT_CLINIC_OR_DEPARTMENT_OTHER): Payer: Medicare Other | Admitting: Physician Assistant

## 2019-08-11 ENCOUNTER — Encounter (HOSPITAL_BASED_OUTPATIENT_CLINIC_OR_DEPARTMENT_OTHER): Payer: Self-pay | Admitting: Emergency Medicine

## 2019-08-11 DIAGNOSIS — I10 Essential (primary) hypertension: Secondary | ICD-10-CM | POA: Insufficient documentation

## 2019-08-11 DIAGNOSIS — R3912 Poor urinary stream: Secondary | ICD-10-CM | POA: Diagnosis not present

## 2019-08-11 DIAGNOSIS — R350 Frequency of micturition: Secondary | ICD-10-CM | POA: Insufficient documentation

## 2019-08-11 DIAGNOSIS — E7800 Pure hypercholesterolemia, unspecified: Secondary | ICD-10-CM | POA: Diagnosis not present

## 2019-08-11 DIAGNOSIS — Z79899 Other long term (current) drug therapy: Secondary | ICD-10-CM | POA: Insufficient documentation

## 2019-08-11 DIAGNOSIS — N200 Calculus of kidney: Secondary | ICD-10-CM | POA: Insufficient documentation

## 2019-08-11 DIAGNOSIS — Z7982 Long term (current) use of aspirin: Secondary | ICD-10-CM | POA: Insufficient documentation

## 2019-08-11 DIAGNOSIS — R351 Nocturia: Secondary | ICD-10-CM | POA: Diagnosis not present

## 2019-08-11 DIAGNOSIS — K219 Gastro-esophageal reflux disease without esophagitis: Secondary | ICD-10-CM | POA: Insufficient documentation

## 2019-08-11 DIAGNOSIS — C61 Malignant neoplasm of prostate: Secondary | ICD-10-CM | POA: Diagnosis not present

## 2019-08-11 DIAGNOSIS — E785 Hyperlipidemia, unspecified: Secondary | ICD-10-CM | POA: Diagnosis not present

## 2019-08-11 DIAGNOSIS — J45909 Unspecified asthma, uncomplicated: Secondary | ICD-10-CM | POA: Diagnosis not present

## 2019-08-11 DIAGNOSIS — N401 Enlarged prostate with lower urinary tract symptoms: Secondary | ICD-10-CM | POA: Insufficient documentation

## 2019-08-11 DIAGNOSIS — Z8042 Family history of malignant neoplasm of prostate: Secondary | ICD-10-CM | POA: Insufficient documentation

## 2019-08-11 DIAGNOSIS — Z791 Long term (current) use of non-steroidal anti-inflammatories (NSAID): Secondary | ICD-10-CM | POA: Insufficient documentation

## 2019-08-11 HISTORY — DX: Diaphragmatic hernia without obstruction or gangrene: K44.9

## 2019-08-11 HISTORY — PX: SPACE OAR INSTILLATION: SHX6769

## 2019-08-11 HISTORY — PX: RADIOACTIVE SEED IMPLANT: SHX5150

## 2019-08-11 HISTORY — DX: Personal history of adenomatous and serrated colon polyps: Z86.0101

## 2019-08-11 HISTORY — DX: Family history of other specified conditions: Z84.89

## 2019-08-11 HISTORY — DX: Unspecified osteoarthritis, unspecified site: M19.90

## 2019-08-11 HISTORY — PX: CYSTOSCOPY: SHX5120

## 2019-08-11 HISTORY — DX: Personal history of colonic polyps: Z86.010

## 2019-08-11 HISTORY — DX: Personal history of urinary calculi: Z87.442

## 2019-08-11 HISTORY — DX: Presence of spectacles and contact lenses: Z97.3

## 2019-08-11 HISTORY — DX: Fatty (change of) liver, not elsewhere classified: K76.0

## 2019-08-11 SURGERY — INSERTION, RADIATION SOURCE, PROSTATE
Anesthesia: General | Site: Rectum

## 2019-08-11 MED ORDER — IOHEXOL 300 MG/ML  SOLN
INTRAMUSCULAR | Status: DC | PRN
  Administered 2019-08-11: 7 mL

## 2019-08-11 MED ORDER — MORPHINE SULFATE (PF) 2 MG/ML IV SOLN
2.0000 mg | INTRAVENOUS | Status: DC | PRN
  Filled 2019-08-11: qty 1

## 2019-08-11 MED ORDER — LIDOCAINE 2% (20 MG/ML) 5 ML SYRINGE
INTRAMUSCULAR | Status: DC | PRN
  Administered 2019-08-11: 100 mg via INTRAVENOUS

## 2019-08-11 MED ORDER — PROPOFOL 10 MG/ML IV BOLUS
INTRAVENOUS | Status: DC | PRN
  Administered 2019-08-11: 200 mg via INTRAVENOUS
  Administered 2019-08-11: 30 mg via INTRAVENOUS

## 2019-08-11 MED ORDER — STERILE WATER FOR INJECTION IJ SOLN
INTRAMUSCULAR | Status: DC | PRN
  Administered 2019-08-11: 3 mL

## 2019-08-11 MED ORDER — FLEET ENEMA 7-19 GM/118ML RE ENEM
1.0000 | ENEMA | Freq: Once | RECTAL | Status: DC
  Filled 2019-08-11: qty 1

## 2019-08-11 MED ORDER — SODIUM CHLORIDE 0.9 % IV SOLN
250.0000 mL | INTRAVENOUS | Status: DC | PRN
  Filled 2019-08-11: qty 250

## 2019-08-11 MED ORDER — ACETAMINOPHEN 650 MG RE SUPP
650.0000 mg | RECTAL | Status: DC | PRN
  Filled 2019-08-11: qty 1

## 2019-08-11 MED ORDER — SODIUM CHLORIDE 0.9 % IR SOLN
Status: DC | PRN
  Administered 2019-08-11: 1000 mL via INTRAVESICAL

## 2019-08-11 MED ORDER — FENTANYL CITRATE (PF) 100 MCG/2ML IJ SOLN
INTRAMUSCULAR | Status: DC | PRN
  Administered 2019-08-11 (×2): 25 ug via INTRAVENOUS
  Administered 2019-08-11: 50 ug via INTRAVENOUS
  Administered 2019-08-11 (×2): 25 ug via INTRAVENOUS

## 2019-08-11 MED ORDER — SODIUM CHLORIDE 0.9% FLUSH
3.0000 mL | INTRAVENOUS | Status: DC | PRN
  Filled 2019-08-11: qty 3

## 2019-08-11 MED ORDER — MIDAZOLAM HCL 2 MG/2ML IJ SOLN
INTRAMUSCULAR | Status: DC | PRN
  Administered 2019-08-11: 2 mg via INTRAVENOUS

## 2019-08-11 MED ORDER — LIDOCAINE 2% (20 MG/ML) 5 ML SYRINGE
INTRAMUSCULAR | Status: AC
  Filled 2019-08-11: qty 5

## 2019-08-11 MED ORDER — FENTANYL CITRATE (PF) 250 MCG/5ML IJ SOLN
INTRAMUSCULAR | Status: AC
  Filled 2019-08-11: qty 5

## 2019-08-11 MED ORDER — ALBUTEROL SULFATE HFA 108 (90 BASE) MCG/ACT IN AERS
INHALATION_SPRAY | RESPIRATORY_TRACT | Status: DC | PRN
  Administered 2019-08-11: 3 via RESPIRATORY_TRACT

## 2019-08-11 MED ORDER — FENTANYL CITRATE (PF) 100 MCG/2ML IJ SOLN
INTRAMUSCULAR | Status: AC
  Filled 2019-08-11: qty 2

## 2019-08-11 MED ORDER — OXYCODONE HCL 5 MG PO TABS
5.0000 mg | ORAL_TABLET | ORAL | Status: DC | PRN
  Filled 2019-08-11: qty 2

## 2019-08-11 MED ORDER — ACETAMINOPHEN 325 MG PO TABS
650.0000 mg | ORAL_TABLET | ORAL | Status: DC | PRN
  Filled 2019-08-11: qty 2

## 2019-08-11 MED ORDER — ONDANSETRON HCL 4 MG/2ML IJ SOLN
INTRAMUSCULAR | Status: DC | PRN
  Administered 2019-08-11: 4 mg via INTRAVENOUS

## 2019-08-11 MED ORDER — HYDROCODONE-ACETAMINOPHEN 5-325 MG PO TABS: 1.0000 | ORAL_TABLET | Freq: Four times a day (QID) | ORAL | 0 refills | Status: DC | PRN

## 2019-08-11 MED ORDER — CIPROFLOXACIN IN D5W 400 MG/200ML IV SOLN
400.0000 mg | INTRAVENOUS | Status: AC
  Administered 2019-08-11: 400 mg via INTRAVENOUS
  Filled 2019-08-11: qty 200

## 2019-08-11 MED ORDER — CIPROFLOXACIN IN D5W 400 MG/200ML IV SOLN
INTRAVENOUS | Status: AC
  Filled 2019-08-11: qty 200

## 2019-08-11 MED ORDER — SODIUM CHLORIDE 0.9% FLUSH
3.0000 mL | Freq: Two times a day (BID) | INTRAVENOUS | Status: DC
  Filled 2019-08-11: qty 3

## 2019-08-11 MED ORDER — PROPOFOL 10 MG/ML IV BOLUS
INTRAVENOUS | Status: AC
  Filled 2019-08-11: qty 40

## 2019-08-11 MED ORDER — LACTATED RINGERS IV SOLN
INTRAVENOUS | Status: DC
  Administered 2019-08-11 (×2): via INTRAVENOUS
  Filled 2019-08-11: qty 1000

## 2019-08-11 MED ORDER — SODIUM CHLORIDE (PF) 0.9 % IJ SOLN
INTRAMUSCULAR | Status: DC | PRN
  Administered 2019-08-11: 10 mL

## 2019-08-11 MED ORDER — MIDAZOLAM HCL 2 MG/2ML IJ SOLN
INTRAMUSCULAR | Status: AC
  Filled 2019-08-11: qty 2

## 2019-08-11 SURGICAL SUPPLY — 45 items
BAG URINE DRAINAGE (UROLOGICAL SUPPLIES) ×4 IMPLANT
BLADE CLIPPER SENSICLIP SURGIC (BLADE) ×4 IMPLANT
CATH FOLEY 2WAY SLVR  5CC 16FR (CATHETERS) ×1
CATH FOLEY 2WAY SLVR 5CC 16FR (CATHETERS) ×3 IMPLANT
CATH ROBINSON RED A/P 16FR (CATHETERS) IMPLANT
CATH ROBINSON RED A/P 20FR (CATHETERS) ×4 IMPLANT
CLOTH BEACON ORANGE TIMEOUT ST (SAFETY) ×4 IMPLANT
CONT SPECI 4OZ STER CLIK (MISCELLANEOUS) ×8 IMPLANT
COVER BACK TABLE 60X90IN (DRAPES) ×4 IMPLANT
COVER MAYO STAND STRL (DRAPES) ×4 IMPLANT
COVER WAND RF STERILE (DRAPES) IMPLANT
DRSG TEGADERM 4X4.75 (GAUZE/BANDAGES/DRESSINGS) ×4 IMPLANT
DRSG TEGADERM 8X12 (GAUZE/BANDAGES/DRESSINGS) ×8 IMPLANT
GAUZE SPONGE 4X4 12PLY STRL LF (GAUZE/BANDAGES/DRESSINGS) ×4 IMPLANT
GLOVE BIO SURGEON STRL SZ 6 (GLOVE) IMPLANT
GLOVE BIO SURGEON STRL SZ 6.5 (GLOVE) IMPLANT
GLOVE BIO SURGEON STRL SZ7 (GLOVE) ×4 IMPLANT
GLOVE BIO SURGEON STRL SZ8 (GLOVE) IMPLANT
GLOVE BIOGEL PI IND STRL 6 (GLOVE) IMPLANT
GLOVE BIOGEL PI IND STRL 6.5 (GLOVE) IMPLANT
GLOVE BIOGEL PI IND STRL 7.0 (GLOVE) IMPLANT
GLOVE BIOGEL PI IND STRL 8 (GLOVE) IMPLANT
GLOVE BIOGEL PI INDICATOR 6 (GLOVE)
GLOVE BIOGEL PI INDICATOR 6.5 (GLOVE)
GLOVE BIOGEL PI INDICATOR 7.0 (GLOVE)
GLOVE BIOGEL PI INDICATOR 8 (GLOVE)
GLOVE SURG ORTHO 8.5 STRL (GLOVE) ×12 IMPLANT
GLOVE SURG SS PI 8.0 STRL IVOR (GLOVE) ×8 IMPLANT
GOWN STRL REUS W/ TWL XL LVL3 (GOWN DISPOSABLE) ×3 IMPLANT
GOWN STRL REUS W/TWL XL LVL3 (GOWN DISPOSABLE) ×5 IMPLANT
HOLDER FOLEY CATH W/STRAP (MISCELLANEOUS) IMPLANT
I-Seed Agx100 ×272 IMPLANT
IMPL SPACEOAR SYSTEM 10ML (Spacer) ×3 IMPLANT
IMPLANT SPACEOAR SYSTEM 10ML (Spacer) ×4 IMPLANT
IV NS 1000ML (IV SOLUTION) ×1
IV NS 1000ML BAXH (IV SOLUTION) ×3 IMPLANT
KIT TURNOVER CYSTO (KITS) ×4 IMPLANT
MARKER SKIN DUAL TIP RULER LAB (MISCELLANEOUS) ×4 IMPLANT
PACK CYSTO (CUSTOM PROCEDURE TRAY) ×4 IMPLANT
SURGILUBE 2OZ TUBE FLIPTOP (MISCELLANEOUS) ×4 IMPLANT
SUT BONE WAX W31G (SUTURE) IMPLANT
SYR 10ML LL (SYRINGE) ×4 IMPLANT
UNDERPAD 30X30 (UNDERPADS AND DIAPERS) ×8 IMPLANT
WATER STERILE IRR 3000ML UROMA (IV SOLUTION) IMPLANT
WATER STERILE IRR 500ML POUR (IV SOLUTION) ×4 IMPLANT

## 2019-08-11 NOTE — Interval H&P Note (Signed)
History and Physical Interval Note:  08/11/2019 7:18 AM  Randall Turner.  has presented today for surgery, with the diagnosis of PROSTATE CANCER.  The various methods of treatment have been discussed with the patient and family. After consideration of risks, benefits and other options for treatment, the patient has consented to  Procedure(s): RADIOACTIVE SEED IMPLANT/BRACHYTHERAPY IMPLANT (N/A) SPACE OAR INSTILLATION (N/A) as a surgical intervention.  The patient's history has been reviewed, patient examined, no change in status, stable for surgery.  I have reviewed the patient's chart and labs.  Questions were answered to the patient's satisfaction.     Irine Seal

## 2019-08-11 NOTE — Op Note (Signed)
PATIENT:  Randall Turner.  PRE-OPERATIVE DIAGNOSIS:  Adenocarcinoma of the prostate  POST-OPERATIVE DIAGNOSIS:  Same  PROCEDURE:  Procedure(s): 1. I-125 radioactive seed implantation 2. SpaceOAR implantation. 3.  Cystoscopy  SURGEON:  Surgeon(s): Irine Seal MD  Radiation oncologist: Dr. Tyler Pita  ANESTHESIA:  General  EBL:  Minimal  DRAINS: 78 French Foley catheter  INDICATION: Randall Turner. is a 68 y.o. with Stage T1c, Gleason 6 prostate cancer who has elected brachytherapy for treatment.  Description of procedure: After informed consent the patient was brought to the major OR, placed on the table and administered general anesthesia. He was then moved to the modified lithotomy position with his perineum perpendicular to the floor. His perineum and genitalia were then sterilely prepped. An official timeout was then performed. A 16 French Foley catheter was then placed in the bladder and filled with dilute contrast, a rectal tube was placed in the rectum and the transrectal ultrasound probe was placed in the rectum and affixed to the stand. He was then sterilely draped.  The sterile grid was installed.   Anchor needles were then placed.   Real time ultrasonography was used along with the seed planning software spot-pro version 3.1-00. This was used to develop the seed plan including the number of needles as well as number of seeds required for complete and adequate coverage. Real-time ultrasonography was then used along with the previously developed plan  to implant a total of 68 seeds using 28 needles for a target dose of 145 Gy. This proceeded without difficulty or complication.  The anchor needles and guide were removed and the SpaceOAR needle was passed under US guidance into the fat stripe posterior to the prostate with the tip in the midline at mid prostate. A puff of NS confirmed appropriate positioning and the SpaceOAR polymer was then injected over 10 seconds  into the space with excellent distribution.     A Foley catheter was then removed as well as the transrectal ultrasound probe and rectal probe. Flexible cystoscopy was then performed using the 17 French flexible scope which revealed a normal urethra throughout its length down to the sphincter which appeared intact. The prostatic urethra was 2-3cm with bilobar hyperplasia and minimal obstruction. The bladder was then entered and fully and systematically inspected.  The ureteral orifices were noted to be of normal configuration and position. The mucosa revealed no evidence of tumors. There were also no stones identified within the bladder.  There were a couple of small clots noted.  No seeds or spacers were seen and/or removed from the bladder.  The cystoscope was then removed.  The drapes were removed.  The perineum was cleaned and dressed.  He was taken out of the lithotomy position and was awakened and taken to recovery room in stable and satisfactory condition. He tolerated procedure well and there were no intraoperative complications.

## 2019-08-11 NOTE — Discharge Instructions (Addendum)
Brachytherapy for Prostate Cancer, Care After ° °This sheet gives you information about how to care for yourself after your procedure. Your health care provider may also give you more specific instructions. If you have problems or questions, contact your health care provider. °What can I expect after the procedure? °After the procedure, it is common to have: °· Trouble passing urine. °· Blood in the urine or semen. °· Constipation. °· Frequent feeling of an urgent need to urinate. °· Bruising, swelling, and tenderness of the area behind the scrotum (perineum). °· Bloating and gas. °· Fatigue. °· Burning or pain in the rectum. °· Problems getting or keeping an erection (erectile dysfunction). °· Nausea. °Follow these instructions at home: °Managing pain, stiffness, and swelling °· If directed, apply ice to the affected area: °? Put ice in a plastic bag. °? Place a towel between your skin and the bag. °? Leave the ice on for 20 minutes, 2-3 times a day. °· Try not to sit directly on the area behind the scrotum. A soft cushion can help with discomfort. °Activity °· Do not drive for 24 hours if you were given a medicine to help you relax (sedative). °· Do not drive or use heavy machinery while taking prescription pain medicine. °· Rest as told by your health care provider. °· Most people can return to normal activities a few days or weeks after the procedure. Ask your health care provider what activities are safe for you. °Eating and drinking °· Drink enough fluid to keep your urine clear or pale yellow. °· Eat a healthy, balanced diet. This includes lean proteins, whole grains, and plenty of fruits and vegetables. °General instructions °· Take over-the-counter and prescription medicines only as told by your health care provider. °· Keep all follow-up visits as told by your health care provider. This is important. You may still need additional treatment. °· Do not take baths, swim, or use a hot tub until your health  care provider approves. Shower and wash the area behind the scrotum gently. °· Do not have sex for one week after the treatment, or until your health care provider approves. °· If you have permanent, low-dose brachytherapy implants: °? Limit close contact with children and pregnant women for 2 months or as told by your health care provider. This is important because of the radiation that is still active in the prostate. °? You may set off radioactive sensors, such as airport screenings. Ask your health care provider for a document that explains your treatment. °? You may be instructed to use a condom during sex for the first 2 months after low-dose brachytherapy. °Contact a health care provider if: °· You have a fever or chills. °· You do not have a bowel movement for 3-4 days after the procedure. °· You have diarrhea for 3-4 days after the procedure. °· You develop any new symptoms, such as problems with urinating or erectile dysfunction. °· You have abdomen (abdominal) pain. °· You have more blood in your urine. °Get help right away if: °· You cannot urinate. °· There is excessive bleeding from your rectum. °· You have unusual drainage coming from your rectum. °· You have severe pain in the treated area that does not go away with pain medicine. °· You have severe nausea or vomiting. °Summary °· If you have permanent, low-dose brachytherapy implants, limit close contact with children and pregnant women for 2 months or as told by your health care provider. This is important because of the radiation   that is still active in the prostate.  Talk with your health care provider about your risk of brachytherapy side effects, such as erectile dysfunction or urinary problems. Your health care provider will be able to recommend possible treatment options.  Keep all follow-up visits as told by your health care provider. This is important. You may need additional treatment. This information is not intended to replace  advice given to you by your health care provider. Make sure you discuss any questions you have with your health care provider. Document Released: 01/12/2011 Document Revised: 11/22/2017 Document Reviewed: 01/11/2017 Elsevier Patient Education  [place] Instructions  Activity: Get plenty of rest for the remainder of the day. A responsible individual must stay with you for 24 hours following the procedure.  For the next 24 hours, DO NOT: -Drive a car -Paediatric nurse -Drink alcoholic beverages -Take any medication unless instructed by your physician -Make any legal decisions or sign important papers.  Meals: Start with liquid foods such as gelatin or soup. Progress to regular foods as tolerated. Avoid greasy, spicy, heavy foods. If nausea and/or vomiting occur, drink only clear liquids until the nausea and/or vomiting subsides. Call your physician if vomiting continues.  Special Instructions/Symptoms: Your throat may feel dry or sore from the anesthesia or the breathing tube placed in your throat during surgery. If this causes discomfort, gargle with warm salt water. The discomfort should disappear within 24 hours.  If you had a scopolamine patch placed behind your ear for the management of post- operative nausea and/or vomiting:  1. The medication in the patch is effective for 72 hours, after which it should be removed.  Wrap patch in a tissue and discard in the trash. Wash hands thoroughly with soap and water. 2. You may remove the patch earlier than 72 hours if you experience unpleasant side effects which may include dry mouth, dizziness or visual disturbances. 3. Avoid touching the patch. Wash your hands with soap and water after contact with the patch.  Radioactive Seed Implant Home Care Instructions   Activity:    Rest for the remainder of the day.  Do not drive or operate equipment today.  You may resume normal  activities in a few days as  instructed by your physician, without risk of harmful radiation exposure to those around you, provided you follow the time and distance precautions on the Radiation Oncology Instruction Sheet.   Meals: Drink plenty of lipuids and eat light foods, such as gelatin or soup this evening .  You may return to normal meal plan tomorrow.  Return To Work: You may return to work as instructed by Naval architect.  Special Instruction:   If any seeds are found, use tweezers to pick up seeds and place in a glass container of any kind and bring to your physician's office.  Call your physician if any of these symptoms occur:   Persistent or heavy bleeding  Urine stream diminishes or stops completely after catheter is removed  Fever equal to or greater than 101 degrees F  Cloudy urine with a strong foul odor  Severe pain  You may feel some burning pain and/or hesitancy when you urinate after the catheter is removed.  These symptoms may increase over the next few weeks, but should diminish within forur to six weeks.  Applying moist heat to the lower abdomen or a hot tub bath may help relieve the pain.  If the discomfort becomes severe, please call  your physician for additional medications.

## 2019-08-11 NOTE — Transfer of Care (Signed)
Immediate Anesthesia Transfer of Care Note  Patient: Randall Turner.  Procedure(s) Performed: Procedure(s) (LRB): RADIOACTIVE SEED IMPLANT/BRACHYTHERAPY IMPLANT (N/A) SPACE OAR INSTILLATION (N/A) CYSTOSCOPY (N/A)  Patient Location: PACU  Anesthesia Type: General  Level of Consciousness: awake, oriented, sedated and patient cooperative  Airway & Oxygen Therapy: Patient Spontanous Breathing and Patient connected to face mask oxygen  Post-op Assessment: Report given to PACU RN and Post -op Vital signs reviewed and stable  Post vital signs: Reviewed and stable  Complications: No apparent anesthesia complications Last Vitals:  Vitals Value Taken Time  BP 106/93 08/11/19 0930  Temp 36.9 C 08/11/19 0923  Pulse 70 08/11/19 0942  Resp 14 08/11/19 0941  SpO2 95 % 08/11/19 0942  Vitals shown include unvalidated device data.  Last Pain:  Vitals:   08/11/19 0923  TempSrc:   PainSc: Asleep      Patients Stated Pain Goal: 5 (08/11/19 0545)

## 2019-08-11 NOTE — Anesthesia Procedure Notes (Signed)
Procedure Name: LMA Insertion Date/Time: 08/11/2019 7:55 AM Performed by: Suan Halter, CRNA Pre-anesthesia Checklist: Patient identified, Emergency Drugs available, Suction available and Patient being monitored Patient Re-evaluated:Patient Re-evaluated prior to induction Oxygen Delivery Method: Circle system utilized Preoxygenation: Pre-oxygenation with 100% oxygen Induction Type: IV induction Ventilation: Mask ventilation without difficulty LMA: LMA inserted LMA Size: 4.0 Number of attempts: 1 Airway Equipment and Method: Bite block Placement Confirmation: positive ETCO2 Tube secured with: Tape Dental Injury: Teeth and Oropharynx as per pre-operative assessment

## 2019-08-12 ENCOUNTER — Encounter (HOSPITAL_BASED_OUTPATIENT_CLINIC_OR_DEPARTMENT_OTHER): Payer: Self-pay | Admitting: Urology

## 2019-08-12 NOTE — Anesthesia Postprocedure Evaluation (Signed)
Anesthesia Post Note  Patient: Randall Turner.  Procedure(s) Performed: RADIOACTIVE SEED IMPLANT/BRACHYTHERAPY IMPLANT (N/A Prostate) SPACE OAR INSTILLATION (N/A Rectum) CYSTOSCOPY (N/A Bladder)     Patient location during evaluation: PACU Anesthesia Type: General Level of consciousness: sedated and patient cooperative Pain management: pain level controlled Vital Signs Assessment: post-procedure vital signs reviewed and stable Respiratory status: spontaneous breathing Cardiovascular status: stable Anesthetic complications: no    Last Vitals:  Vitals:   08/11/19 1000 08/11/19 1110  BP: (!) 128/91 135/84  Pulse: 67 66  Resp: 11 14  Temp:  36.6 C  SpO2: 97% 96%    Last Pain:  Vitals:   08/12/19 1038  TempSrc:   PainSc: 0-No pain                 Nolon Nations

## 2019-08-16 NOTE — Progress Notes (Signed)
  Radiation Oncology         [phone]) [phone] ________________________________  Name: Randall Turner. MRN: [id number]  Date: 08/16/2019  DOB: 14-Oct-1951       Prostate Seed Implant  [id number], Randall Mars, MD  No ref. provider found  DIAGNOSIS: 68 y.o. gentleman with Stage T1c adenocarcinoma of the prostate with Gleason score of 3+3, and PSA of 3.47.    ICD-10-CM   1. Prostate cancer ([place])  C61 DG Chest 2 View    DG Chest 2 View    PROCEDURE: Insertion of radioactive I-125 seeds into the prostate gland.  RADIATION DOSE: 145 Gy, definitive therapy.  TECHNIQUE: Randall Turner. was brought to the operating room with the urologist. He was placed in the dorsolithotomy position. He was catheterized and a rectal tube was inserted. The perineum was shaved, prepped and draped. The ultrasound probe was then introduced into the rectum to see the prostate gland.  TREATMENT DEVICE: A needle grid was attached to the ultrasound probe stand and anchor needles were placed.  3D PLANNING: The prostate was imaged in 3D using a sagittal sweep of the prostate probe. These images were transferred to the planning computer. There, the prostate, urethra and rectum were defined on each axial reconstructed image. Then, the software created an optimized 3D plan and a few seed positions were adjusted. The quality of the plan was reviewed using [facility] information for the target and the following two organs at risk:  Urethra and Rectum.  Then the accepted plan was printed and handed off to the radiation therapist.  Under my supervision, the custom loading of the seeds and spacers was carried out and loaded into sealed vicryl sleeves.  These pre-loaded needles were then placed into the needle holder.Marland Kitchen  PROSTATE VOLUME STUDY:  Using transrectal ultrasound the volume of the prostate was verified to be 50.9 cc.  SPECIAL TREATMENT PROCEDURE/SUPERVISION AND HANDLING: The pre-loaded needles were then delivered under  sagittal guidance. A total of 28 needles were used to deposit 68 seeds in the prostate gland. The individual seed activity was 0.545 mCi.  SpaceOAR:  Yes  COMPLEX SIMULATION: At the end of the procedure, an anterior radiograph of the pelvis was obtained to document seed positioning and count. Cystoscopy was performed to check the urethra and bladder.  MICRODOSIMETRY: At the end of the procedure, the patient was emitting 0.068 mR/hr at 1 meter. Accordingly, he was considered safe for hospital discharge.  PLAN: The patient will return to the radiation oncology clinic for post implant CT dosimetry in three weeks.   ________________________________  Sheral Apley Tammi Klippel, M.D.

## 2019-08-20 ENCOUNTER — Other Ambulatory Visit: Payer: Self-pay | Admitting: Internal Medicine

## 2019-08-20 DIAGNOSIS — K219 Gastro-esophageal reflux disease without esophagitis: Secondary | ICD-10-CM

## 2019-08-27 ENCOUNTER — Telehealth: Payer: Self-pay | Admitting: *Deleted

## 2019-08-27 NOTE — Telephone Encounter (Signed)
Called patient to remind of post seed appts. and his MRI for 09-03-19, spoke with patient and he is aware of these appts.

## 2019-09-01 DIAGNOSIS — R3912 Poor urinary stream: Secondary | ICD-10-CM | POA: Diagnosis not present

## 2019-09-01 NOTE — Progress Notes (Signed)
  Radiation Oncology         [phone]) [phone] ________________________________  Name: Randall Turner. MRN: [id number]  Date: 09/03/2019  DOB: [birth year]  COMPLEX SIMULATION NOTE  NARRATIVE:  The patient was brought to the [facility] today following prostate seed implantation approximately one month ago.  Identity was confirmed.  All relevant records and images related to the planned course of therapy were reviewed.  Then, the patient was set-up supine.  CT images were obtained.  The CT images were loaded into the planning software.  Then the prostate and rectum were contoured.  Treatment planning then occurred.  The implanted iodine 125 seeds were identified by the physics staff for projection of radiation distribution  I have requested : 3D Simulation  I have requested a DVH of the following structures: Prostate and rectum.    ________________________________  Sheral Apley Tammi Klippel, M.D.

## 2019-09-02 NOTE — Progress Notes (Signed)
Radiation Oncology         [phone]) [phone] ________________________________  Name: Randall Turner. MRN: [id number]  Date: 09/03/2019  DOB: 1951-06-07  Post-Seed Follow-Up Visit Note  CC: Marin Olp, MD  Marin Olp, MD  Diagnosis:   68 y.o. gentleman with Stage T1c adenocarcinoma of the prostate with Gleason score of 3+3, and PSA of 3.47.    ICD-10-CM   1. Malignant neoplasm of prostate ([facility])  C61     Interval Since Last Radiation:  3 weeks 08/11/19:  Insertion of radioactive I-125 seeds into the prostate gland; 145 Gy, definitive/boost therapy with placement of SpaceOAR gel.  Narrative:  The patient returns today for routine follow-up.  He is complaining of increased urinary frequency and urinary hesitation symptoms. He filled out a questionnaire regarding urinary function today providing and overall IPSS score of 15 characterizing his symptoms as moderate with weak stream, increased frequency, urgency and intermittency.  His pre-implant score was 7. He continues with mild burning at the start of his stream but otherwise denies dysuria or gross hematuria and feels that he is emptying his bladder well on voiding in general. He denies any abdominal pain or bowel symptoms and reports a healthy appetite.  He denies significant fatigue and overall, is quite pleased with his progress to date.  ALLERGIES:  is allergic to cortisone.  Meds: Current Outpatient Medications  Medication Sig Dispense Refill  . Albuterol Sulfate (PROAIR RESPICLICK) [id number] (90 Base) MCG/ACT AEPB Inhale 2 puffs into the lungs every 4 (four) hours as needed. (Patient taking differently: Inhale 2 puffs into the lungs every 4 (four) hours as needed (asthma). ) 1 each 3  . aspirin EC 81 MG tablet Take 162 mg by mouth daily.    Marland Kitchen FLUZONE HIGH-DOSE QUADRIVALENT 0.7 ML SUSY PHARMACIST ADMINISTERED IMMUNIZATION ADMINISTERED AT TIME OF DISPENSING    . losartan (COZAAR) 50 MG tablet TAKE 1 TABLET BY MOUTH  DAILY  (Patient taking differently: Take 50 mg by mouth daily. ) 90 tablet 1  . naproxen sodium (ALEVE) 220 MG tablet Take 220 mg by mouth 2 (two) times daily as needed.    Marland Kitchen omeprazole (PRILOSEC) 20 MG capsule TAKE 1 CAPSULE BY MOUTH  ONCE DAILY 90 capsule 3  . OVER THE COUNTER MEDICATION Take 1 tablet by mouth. Mens multivitamin without iron    . pravastatin (PRAVACHOL) 20 MG tablet TAKE 1 TABLET BY MOUTH  DAILY (Patient taking differently: Take 20 mg by mouth. ) 90 tablet 3  . HYDROcodone-acetaminophen (NORCO) 5-325 MG tablet Take 1 tablet by mouth every 6 (six) hours as needed for moderate pain. (Patient not taking: Reported on 09/03/2019) 6 tablet 0   No current facility-administered medications for this encounter.     Physical Findings: In general this is a well appearing Caucasian male in no acute distress. He's alert and oriented x4 and appropriate throughout the examination. Cardiopulmonary assessment is negative for acute distress and he exhibits normal effort.   Lab Findings: Lab Results  Component Value Date   WBC 5.7 08/07/2019   HGB 16.1 08/07/2019   HCT 47.1 08/07/2019   MCV 90.8 08/07/2019   PLT 199 08/07/2019    Radiographic Findings:  Patient underwent CT imaging in our clinic for post implant dosimetry. The CT will be reviewed by Dr. Tammi Klippel to confirm there is an adequate distribution of radioactive seeds throughout the prostate gland and ensure that there are no seeds in or near the rectum. His scheduled for prostate  MRI later today at 12 noon and those images will be fused with his CT images for further evaluation. We suspect the final radiation plan and dosimetry will show appropriate coverage of the prostate gland. He understands that we will call and inform him of any unexpected findings on further review of his imaging and dosimetry.  Impression/Plan: 68 y.o. gentleman with Stage T1c adenocarcinoma of the prostate with Gleason score of 3+3, and PSA of 3.47. The patient is  recovering from the effects of radiation. His urinary symptoms should gradually improve over the next 4-6 months. We talked about this today. He is encouraged by his improvement already and is otherwise pleased with his outcome. We also talked about long-term follow-up for prostate cancer following seed implant. He understands that ongoing PSA determinations and digital rectal exams will help perform surveillance to rule out disease recurrence. He has a follow up appointment scheduled with Dr. Jeffie Pollock in early December 2020. He understands what to expect with his PSA measures. Patient was also educated today about some of the long-term effects from radiation including a small risk for rectal bleeding and possibly erectile dysfunction. We talked about some of the general management approaches to these potential complications. However, I did encourage the patient to contact our office or return at any point if he has questions or concerns related to his previous radiation and prostate cancer.    Nicholos Johns, PA-C

## 2019-09-03 ENCOUNTER — Ambulatory Visit
Admission: RE | Admit: 2019-09-03 | Discharge: 2019-09-03 | Disposition: A | Discharge status: Home / Self Care | Payer: Medicare Other | Source: Ambulatory Visit | Attending: Urology | Admitting: Urology

## 2019-09-03 ENCOUNTER — Ambulatory Visit (HOSPITAL_COMMUNITY)
Admission: RE | Admit: 2019-09-03 | Discharge: 2019-09-03 | Disposition: A | Discharge status: Home / Self Care | Payer: Medicare Other | Source: Ambulatory Visit | Attending: Urology | Admitting: Urology

## 2019-09-03 ENCOUNTER — Ambulatory Visit
Admission: RE | Admit: 2019-09-03 | Discharge: 2019-09-03 | Disposition: A | Discharge status: Home / Self Care | Payer: Medicare Other | Source: Ambulatory Visit | Attending: Radiation Oncology | Admitting: Radiation Oncology

## 2019-09-03 ENCOUNTER — Encounter: Payer: Self-pay | Admitting: Urology

## 2019-09-03 ENCOUNTER — Other Ambulatory Visit: Payer: Self-pay

## 2019-09-03 VITALS — BP 135/84 | HR 83 | Temp 98.9°F | Resp 18 | Wt 235.2 lb

## 2019-09-03 DIAGNOSIS — Z7982 Long term (current) use of aspirin: Secondary | ICD-10-CM | POA: Diagnosis not present

## 2019-09-03 DIAGNOSIS — C61 Malignant neoplasm of prostate: Secondary | ICD-10-CM | POA: Insufficient documentation

## 2019-09-03 DIAGNOSIS — Z79899 Other long term (current) drug therapy: Secondary | ICD-10-CM | POA: Insufficient documentation

## 2019-09-03 DIAGNOSIS — Z923 Personal history of irradiation: Secondary | ICD-10-CM | POA: Diagnosis not present

## 2019-09-03 NOTE — Progress Notes (Signed)
Randall Turner is here for a post seed follow-up appointment. Patient has a MRI scheduled for today. Patient states that he has some dysuria at the start of his stream. Patient reports a delay in starting his stream. Patient states that he empties his bladder with urination. Patient states that he void every 2 hours at night. Patient states that his stream is weak. Patient denies  any hematuria . Patient denies any issues with his bowels.Patient denies any fatigue. Patient went to his urologist on 09/01/2019. States that he will go back in three months.  Vitals:   09/03/19 0914  BP: 135/84  Pulse: 83  Resp: 18  Temp: 98.9 F (37.2 C)  TempSrc: Oral  SpO2: 97%  Weight: 235 lb 4 oz (106.7 kg)

## 2019-09-09 ENCOUNTER — Encounter: Payer: Self-pay | Admitting: Radiation Oncology

## 2019-09-09 DIAGNOSIS — C61 Malignant neoplasm of prostate: Secondary | ICD-10-CM | POA: Diagnosis not present

## 2019-09-13 NOTE — Progress Notes (Signed)
  Radiation Oncology         [phone]) [phone] ________________________________  Name: [name]. MRN: [id number]  Date: 09/09/2019  DOB: 07-11-1951  3D Planning Note   Prostate Brachytherapy Post-Implant Dosimetry  Diagnosis: 68 y.o. gentleman with Stage T1c adenocarcinoma of the prostate with Gleason score of 3+3, and PSA of 3.47  Narrative: On a previous date, Randall Turner. returned following prostate seed implantation for post implant planning. He underwent CT scan complex simulation to delineate the three-dimensional structures of the pelvis and demonstrate the radiation distribution.  Since that time, the seed localization, and complex isodose planning with dose volume histograms have now been completed.  Results:   Prostate Coverage - The dose of radiation delivered to the 90% or more of the prostate gland (D90) was 122.59% of the prescription dose. This exceeds our goal of greater than 90%. Rectal Sparing - The volume of rectal tissue receiving the prescription dose or higher was 0.02 cc. This falls under our thresholds tolerance of 1.0 cc.  Impression: The prostate seed implant appears to show adequate target coverage and appropriate rectal sparing.  Plan:  The patient will continue to follow with urology for ongoing PSA determinations. I would anticipate a high likelihood for local tumor control with minimal risk for rectal morbidity.  ________________________________  Sheral Apley Tammi Klippel, M.D.

## 2019-09-14 ENCOUNTER — Other Ambulatory Visit: Payer: Self-pay | Admitting: Family Medicine

## 2019-11-09 LAB — PSA: PSA: 2.76

## 2019-11-25 DIAGNOSIS — R3912 Poor urinary stream: Secondary | ICD-10-CM | POA: Diagnosis not present

## 2020-01-20 ENCOUNTER — Ambulatory Visit (INDEPENDENT_AMBULATORY_CARE_PROVIDER_SITE_OTHER): Payer: Medicare Other | Admitting: Family Medicine

## 2020-01-20 ENCOUNTER — Other Ambulatory Visit: Payer: Self-pay

## 2020-01-20 ENCOUNTER — Encounter: Payer: Self-pay | Admitting: Family Medicine

## 2020-01-20 VITALS — BP 130/84 | HR 73 | Temp 97.3°F | Ht 71.0 in | Wt 229.0 lb

## 2020-01-20 DIAGNOSIS — R739 Hyperglycemia, unspecified: Secondary | ICD-10-CM | POA: Diagnosis not present

## 2020-01-20 DIAGNOSIS — K219 Gastro-esophageal reflux disease without esophagitis: Secondary | ICD-10-CM

## 2020-01-20 DIAGNOSIS — E785 Hyperlipidemia, unspecified: Secondary | ICD-10-CM | POA: Diagnosis not present

## 2020-01-20 DIAGNOSIS — Z0000 Encounter for general adult medical examination without abnormal findings: Secondary | ICD-10-CM | POA: Diagnosis not present

## 2020-01-20 DIAGNOSIS — C61 Malignant neoplasm of prostate: Secondary | ICD-10-CM

## 2020-01-20 DIAGNOSIS — R7989 Other specified abnormal findings of blood chemistry: Secondary | ICD-10-CM

## 2020-01-20 DIAGNOSIS — I10 Essential (primary) hypertension: Secondary | ICD-10-CM

## 2020-01-20 DIAGNOSIS — J4520 Mild intermittent asthma, uncomplicated: Secondary | ICD-10-CM

## 2020-01-20 DIAGNOSIS — Z86010 Personal history of colonic polyps: Secondary | ICD-10-CM

## 2020-01-20 LAB — COMPREHENSIVE METABOLIC PANEL
ALT: 28 U/L (ref 0–53)
AST: 23 U/L (ref 0–37)
Albumin: 4.6 g/dL (ref 3.5–5.2)
Alkaline Phosphatase: 93 U/L (ref 39–117)
BUN: 14 mg/dL (ref 6–23)
CO2: 28 mEq/L (ref 19–32)
Calcium: 9.6 mg/dL (ref 8.4–10.5)
Chloride: 105 mEq/L (ref 96–112)
Creatinine, Ser: 1.1 mg/dL (ref 0.40–1.50)
GFR: 66.75 mL/min (ref >60.00)
Glucose, Bld: 103 mg/dL — ABNORMAL HIGH (ref 70–99)
Potassium: 4.6 mEq/L (ref 3.5–5.1)
Sodium: 139 mEq/L (ref 135–145)
Total Bilirubin: 1.2 mg/dL (ref 0.2–1.2)
Total Protein: 6.8 g/dL (ref 6.0–8.3)

## 2020-01-20 LAB — CBC WITH DIFFERENTIAL/PLATELET
Basophils Absolute: 0 10*3/uL (ref 0.0–0.1)
Basophils Relative: 0.9 % (ref 0.0–3.0)
Eosinophils Absolute: 0.2 10*3/uL (ref 0.0–0.7)
Eosinophils Relative: 4.8 % (ref 0.0–5.0)
HCT: 45.8 % (ref 39.0–52.0)
Hemoglobin: 15.7 g/dL (ref 13.0–17.0)
Lymphocytes Relative: 22 % (ref 12.0–46.0)
Lymphs Abs: 1.1 10*3/uL (ref 0.7–4.0)
MCHC: 34.4 g/dL (ref 30.0–36.0)
MCV: 89.8 fl (ref 78.0–100.0)
Monocytes Absolute: 0.5 10*3/uL (ref 0.1–1.0)
Monocytes Relative: 11.1 % (ref 3.0–12.0)
Neutro Abs: 3 10*3/uL (ref 1.4–7.7)
Neutrophils Relative %: 61.2 % (ref 43.0–77.0)
Platelets: 186 10*3/uL (ref 150.0–400.0)
RBC: 5.1 Mil/uL (ref 4.22–5.81)
RDW: 13.5 % (ref 11.5–15.5)
WBC: 4.8 10*3/uL (ref 4.0–10.5)

## 2020-01-20 LAB — LIPID PANEL
Cholesterol: 135 mg/dL (ref 0–200)
HDL: 51.8 mg/dL (ref >39.00)
LDL Cholesterol: 47 mg/dL (ref 0–99)
NonHDL: 82.78
Total CHOL/HDL Ratio: 3
Triglycerides: 178 mg/dL — ABNORMAL HIGH (ref 0.0–149.0)
VLDL: 35.6 mg/dL (ref 0.0–40.0)

## 2020-01-20 LAB — IBC + FERRITIN
Ferritin: 34.2 ng/mL (ref 22.0–322.0)
Iron: 140 ug/dL (ref 42–165)
Saturation Ratios: 39.4 % (ref 20.0–50.0)
Transferrin: 254 mg/dL (ref 212.0–360.0)

## 2020-01-20 NOTE — Patient Instructions (Addendum)
Great job with weight loss! Keep up the good work! Try to add in exercise as you continue to heal  No changes today  Please stop by lab before you go If you do not have mychart- we will call you about results within 5 business days of [place] receiving them.  If you have mychart- we will send your results within 3 business days of [place] receiving them.  If abnormal or we want to clarify a result, we will call or mychart you to make sure you receive the message.  If you have questions or concerns or don't hear within 5-7 days, please send [place] a message or call us.

## 2020-01-20 NOTE — Progress Notes (Signed)
Phone: [phone]   Subjective:  Patient presents today for their annual physical. Chief complaint-noted.   See problem oriented charting- ROS- full  review of systems was completed and negative  except for:  Occasional breathing issues which resolve with albuterol, healing from prostate cancer surgery  The following were reviewed and entered/updated in epic: Past Medical History:  Diagnosis Date  . Arthritis   . Asthma    followed by pcp  . Family history of adverse reaction to anesthesia    per pt his son age 75 w/ hand surgery done approx late 1980s/early 1990s,  post op amnesia;  son has had surgery since with no issues  . Fatty liver    followed by pcp-- per pt told by pcp blood work better  . GERD (gastroesophageal reflux disease)   . 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 Hiatal hernia   . History of adenomatous polyp of colon   . History of kidney stones   . Hyperlipidemia   . Hypertension   . Internal hemorrhoids   . Prostate cancer [facility]) urologist-- dr wrenn/ oncologist-- dr Tammi Klippel   dx 04-13-2019--- Stage T1c, Gleason 3+3  . Wears glasses    Patient Active Problem List   Diagnosis Date Noted  . Malignant neoplasm of prostate ([place]) 05/05/2019    Priority: High  . 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 Elevated LFTs 07/17/2019    Priority: Medium  . Asthma 08/09/2017    Priority: Medium  . Essential hypertension 09/15/2015    Priority: Medium  . Hyperglycemia 07/28/2013    Priority: Medium  . Hyperlipidemia 07/28/2013    Priority: Medium  . Esophageal reflux 07/28/2013    Priority: Medium  . History of adenomatous polyp of colon 08/09/2017    Priority: Low  . Closed fracture of fifth metatarsal bone of right foot 11/24/2018  . Right foot pain  11/24/2018   Past Surgical History:  Procedure Laterality Date  . BUNIONECTOMY Bilateral 2005  . COLONOSCOPY  last one 2018  . CYSTOSCOPY N/A 08/11/2019   Procedure: CYSTOSCOPY;  Surgeon: Irine Seal, MD;  Location: [facility];  Service: Urology;  Laterality: N/A;  . PATELLECTOMY Right 1969   football injury   . RADIOACTIVE SEED IMPLANT N/A 08/11/2019   Procedure: RADIOACTIVE SEED IMPLANT/BRACHYTHERAPY IMPLANT;  Surgeon: Irine Seal, MD;  Location: [facility];  Service: Urology;  Laterality: N/A;  . SPACE OAR INSTILLATION N/A 08/11/2019   Procedure: SPACE OAR INSTILLATION;  Surgeon: Irine Seal, MD;  Location: [facility];  Service: Urology;  Laterality: N/A;  . TONSILLECTOMY  1958    Family History  Problem Relation Age of Onset  . Gallstones Mother   . Colon cancer Mother   . Lung cancer Mother        discovered near death  . Heart disease Mother        rhythm and mitral valve prolapse  . Alzheimer's disease Father   . Prostate cancer Father        radiation when younger- survived  . Mesothelioma Father   . Heart disease Maternal Grandmother        died at young age from heart issue unkown  . Cancer Maternal Grandfather   . Heart disease Paternal Grandmother  enlarged heart  . Heart attack Paternal Grandfather        age 37  . Nephrolithiasis Sister   . Diabetes Maternal Aunt   . Pancreatic cancer Neg Hx   . Stomach cancer Neg Hx     Medications- reviewed and updated Current Outpatient Medications  Medication Sig Dispense Refill  . Albuterol Sulfate (PROAIR RESPICLICK) 123XX123 (90 Base) MCG/ACT AEPB Inhale 2 puffs into the lungs every 4 (four) hours as needed. (Patient taking differently: Inhale 2 puffs into the lungs every 4 (four) hours as needed (asthma). ) 1 each 3  . aspirin EC 81 MG tablet Take 162 mg by mouth daily.    Marland Kitchen losartan (COZAAR) 50 MG tablet TAKE 1 TABLET BY MOUTH  DAILY 90 tablet 1  . naproxen sodium  (ALEVE) 220 MG tablet Take 220 mg by mouth 2 (two) times daily as needed.    Marland Kitchen omeprazole (PRILOSEC) 20 MG capsule TAKE 1 CAPSULE BY MOUTH  ONCE DAILY 90 capsule 3  . OVER THE COUNTER MEDICATION Take 1 tablet by mouth. Mens multivitamin without iron    . pravastatin (PRAVACHOL) 20 MG tablet TAKE 1 TABLET BY MOUTH  DAILY 90 tablet 1   No current facility-administered medications for this visit.    Allergies-reviewed and updated Allergies  Allergen Reactions  . Cortisone Other (See Comments)    Topical cortisone caused swelling    Social History   Social History Narrative   Married 1975. 2 sons. 4 grandkids- all local (21 to 10 in 2018)      [name]   2 years of college.       Hobbies: enjoys watching football with wife NFL, movies, pool in back yard, grandkids.    Objective  Objective:  BP 130/84   Pulse 73   Temp (!) 97.3 F (36.3 C)   Ht 5\' 11"  (1.803 m)   Wt 229 lb (103.9 kg)   SpO2 95%   BMI 31.94 kg/m  Gen: NAD, resting comfortably HEENT: Mask not removed due to covid 19. TM normal. Bridge of nose normal. Eyelids normal.  Neck: no thyromegaly or cervical lymphadenopathy  CV: RRR no murmurs rubs or gallops Lungs: CTAB no crackles, wheeze, rhonchi Abdomen: soft/nontender/nondistended/normal bowel sounds. No rebound or guarding. Umbilical hernia stable Ext: no edema Skin: warm, dry Neuro: grossly normal, moves all extremities, PERRLA   Assessment and Plan  69 y.o. male presenting for annual physical.  Health Maintenance counseling: 1. Anticipatory guidance: Patient counseled regarding regular dental exams yes q6 months, eye exams yes yearly,  avoiding smoking and second hand smoke yes , limiting alcohol to 0 beverages per day.   2. Risk factor reduction:  Advised patient of need for regular exercise and diet rich and fruits and vegetables to reduce risk of heart attack and stroke. Exercise- no exercise lately- he is still in healing process from surgery-  encouraged as improves to consider restarting . Diet- watching what he eats and restricts sugar and cooks at home.  He has continued his weight loss journey and is down another 6 pounds from September.  He is down from peak of 260 to 229 at this time over about a year! Wt Readings from Last 3 Encounters:  01/20/20 229 lb (103.9 kg)  09/03/19 235 lb 4 oz (106.7 kg)  08/11/19 238 lb 9.6 oz (108.2 kg)  3. Immunizations/screenings/ancillary studies-discussed Shingrix and opts to [employer] for now.  Discussed COVID-19 vaccination and interested when he can get it- advised  weight list  Immunization History  Administered Date(s) Administered  . Influenza Split 10/30/2013  . Influenza, High Dose Seasonal PF 09/11/2018, 08/26/2019  . Influenza,inj,Quad PF,6+ Mos 08/03/2014, 09/15/2015, 09/13/2016  . Influenza-Unspecified 11/11/2017  . Pneumococcal Conjugate-13 01/03/2018  . Pneumococcal Polysaccharide-23 01/16/2019  . Pneumococcal-Unspecified 11/22/2006  . Tdap 02/18/2007, 05/16/2017  . Zoster 12/06/2013   4. Prostate cancer - follow-up with Dr. Jeffie Pollock.  Status post radioactive seed implants with Dr. Tammi Klippel. PSA will be done with urology in march- he states was already trending down on November check. Urinary pattern improving slowly but surely Lab Results  Component Value Date   PSA 2.76 11/09/2019   PSA 3.47 04/07/2019   PSA 4.16 (H) 01/16/2019   5. Colon cancer screening -2018 with 5-year follow-up due to polyp history 6. Skin cancer screening- no recent dermatology visits. advised regular sunscreen use- tries to use hat- doesn't use high heat times with pool. Denies worrisome, changing, or new skin lesions.  7. never smoker  Status of chronic or acute concerns    #Hemochromatosis-follows with [name] #Elevated LFTs S: Ferritin goal under 100.  Has had mild LFT elevation in the past-monitoring as long as remains under 100. Lab Results  Component Value Date   FERRITIN 31 06/17/2019   A/P: Update ferritin today with labs and follow-up with [name] if needed.  Monitor LFTs/transaminases   #Hypertension S: Controlled on losartan 50 mg. Home #s rarely checks but denies having issues A/P:    Stable. Continue current medications.     #Hyperlipidemia S: Compliant with pravastatin 20 mg. Takes asa 162mg  Lab Results  Component Value Date   CHOL 171 01/16/2019   HDL 54.30 01/16/2019   LDLCALC 99 05/16/2017   LDLDIRECT 87.0 01/16/2019   TRIG 222.0 (H) 01/16/2019   CHOLHDL 3 01/16/2019   A/P: We will update lipid panel-discussed newer more aggressive guidelines suggesting LDL under 70-he wants to work on lifestyle to get their    #Hyperglycemia S: CBGs high in the past.  A1c not elevated.  Lab Results  Component Value Date   HGBA1C 5.5 01/16/2019   HGBA1C 5.6 01/03/2018   HGBA1C 5.2 09/15/2015  A/P: Hopefully improving with weight loss-we opted to get fasting CBG alone and defer A1c for now     #Asthma S: No controller.  Sparingly uses albuterol- has used as preventative at times- used twice this week- helps with irritation in upper chest. Cat allergy A/P: Doing well overall-continue to monitor.  We did briefly discuss newer smart therapy-could convert if issues on current regimen    #GERD S: History of Schatzki's ring.  On long-term PPI on omeprazole 20mg .  His B12 levels have been normal Lab Results  Component Value Date   [id number] 01/16/2019   A/P: stable- continue current meds and GI follow up as needed - he feels possibly could come off- discussed possible conversion to pepcid- he is going to talk to Dr. [name] next visit  Recommended follow up: Return in about 6 months (around 07/19/2020) for follow up- or sooner if needed. Future Appointments  Date Time Provider [place]  06/15/2020 11:00 AM [facility] LAB ONLY [facility] None  06/17/2020  9:00 AM [place], Rudell Cobb, MD [facility] None   Lab/Order associations:  fasting   ICD-10-CM   1.  Preventative health care  Z00.00   2. Malignant neoplasm of prostate ([place])  C61   3. 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  Hyperlipidemia, unspecified hyperlipidemia type  E78.5   5. Hyperglycemia  R73.9   6. Essential hypertension  I10   7. Gastroesophageal reflux disease, unspecified whether esophagitis present  K21.9   8. Elevated LFTs  R79.89   9. Mild intermittent asthma without complication  A999333   10. History of adenomatous polyp of colon  Z86.010     No orders of the defined types were placed in this encounter.   Return precautions advised.  Garret Reddish, MD

## 2020-01-27 ENCOUNTER — Telehealth: Payer: Self-pay | Admitting: Family Medicine

## 2020-01-27 ENCOUNTER — Other Ambulatory Visit: Payer: Self-pay

## 2020-01-27 MED ORDER — LOSARTAN POTASSIUM 50 MG PO TABS: 50.0000 mg | ORAL_TABLET | Freq: Every day | ORAL | 1 refills | Status: DC

## 2020-01-27 MED ORDER — PRAVASTATIN SODIUM 20 MG PO TABS: 20.0000 mg | ORAL_TABLET | Freq: Every day | ORAL | 3 refills | Status: DC

## 2020-01-27 NOTE — Telephone Encounter (Signed)
  LAST APPOINTMENT DATE: 01/20/2020   NEXT APPOINTMENT DATE:@2 /03/2020-AWV   MEDICATION:losartan (COZAAR) 50 MG tablet/pravastatin (PRAVACHOL) 20 MG tablet  [place], [place] - [address]   **Let patient know to contact pharmacy at the end of the day to make sure medication is ready. **  ** Please notify patient to allow 48-72 hours to process**  **Encourage patient to contact the pharmacy for refills or they can request refills through [facility]**  CLINICAL FILLS OUT ALL BELOW:   LAST REFILL:  QTY:  REFILL DATE:    OTHER COMMENTS:    Okay for refill?  Please advise

## 2020-01-27 NOTE — Telephone Encounter (Signed)
Medications refilled

## 2020-01-28 ENCOUNTER — Other Ambulatory Visit: Payer: Self-pay

## 2020-01-28 ENCOUNTER — Ambulatory Visit (INDEPENDENT_AMBULATORY_CARE_PROVIDER_SITE_OTHER): Payer: Medicare Other

## 2020-01-28 DIAGNOSIS — Z0000 Encounter for general adult medical examination without abnormal findings: Secondary | ICD-10-CM

## 2020-01-28 NOTE — Progress Notes (Signed)
This visit is being conducted via phone call due to the COVID-19 pandemic. This patient has given me verbal consent via phone to conduct this visit, patient states they are participating from their home address. Some vital signs may be absent or patient reported.   Patient identification: identified by name, DOB, and current address.  Location provider: [facility], Office Persons participating in the virtual visit: Denman George LPN, patient, and Dr. Garret Reddish    Subjective:   Randall Turner. is a 69 y.o. male who presents for an Initial Medicare Annual Wellness Visit.  Review of Systems   Cardiac Risk Factors include: advanced age (>8men, >37 women);male gender;hypertension;dyslipidemia   Objective:    There were no vitals filed for this visit. There is no height or weight on file to calculate BMI.  Advanced Directives 01/28/2020 09/03/2019 08/11/2019 06/19/2019 05/05/2019 06/13/2018 06/13/2017  Does Patient Have a Medical Advance Directive? No No No No No No No  Would patient like information on creating a medical advance directive? Yes (MAU/Ambulatory/Procedural Areas - Information given) - No - Patient declined No - Patient declined No - Patient declined - -    Current Medications (verified) Outpatient Encounter Medications as of 01/28/2020  Medication Sig  . Albuterol Sulfate (PROAIR RESPICLICK) 123XX123 (90 Base) MCG/ACT AEPB Inhale 2 puffs into the lungs every 4 (four) hours as needed. (Patient taking differently: Inhale 2 puffs into the lungs every 4 (four) hours as needed (asthma). )  . aspirin EC 81 MG tablet Take 162 mg by mouth daily.  Marland Kitchen losartan (COZAAR) 50 MG tablet Take 1 tablet (50 mg total) by mouth daily.  . naproxen sodium (ALEVE) 220 MG tablet Take 220 mg by mouth 2 (two) times daily as needed.  Marland Kitchen omeprazole (PRILOSEC) 20 MG capsule TAKE 1 CAPSULE BY MOUTH  ONCE DAILY  . OVER THE COUNTER MEDICATION Take 1 tablet by mouth. Mens multivitamin without iron  .  pravastatin (PRAVACHOL) 20 MG tablet Take 1 tablet (20 mg total) by mouth daily.   No facility-administered encounter medications on file as of 01/28/2020.    Allergies (verified) Cortisone   History: Past Medical History:  Diagnosis Date  . Arthritis   . Asthma    followed by pcp  . Family history of adverse reaction to anesthesia    per pt his son age 70 w/ hand surgery done approx late 1980s/early 1990s,  post op amnesia;  son has had surgery since with no issues  . Fatty liver    followed by pcp-- per pt told by pcp blood work better  . GERD (gastroesophageal reflux disease)   . 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)    followed by dr Marin Olp ([place])--  last phlebotomy 12/ 2016  . Hiatal hernia   . History of adenomatous polyp of colon   . History of kidney stones   . Hyperlipidemia   . Hypertension   . Internal hemorrhoids   . Prostate cancer [facility]) urologist-- dr wrenn/ oncologist-- dr Tammi Klippel   dx 04-13-2019--- Stage T1c, Gleason 3+3  . Wears glasses    Past Surgical History:  Procedure Laterality Date  . BUNIONECTOMY Bilateral 2005  . COLONOSCOPY  last one 2018  . CYSTOSCOPY N/A 08/11/2019   Procedure: CYSTOSCOPY;  Surgeon: Irine Seal, MD;  Location: [facility];  Service: Urology;  Laterality: N/A;  . PATELLECTOMY Right 1969   football injury   . RADIOACTIVE  SEED IMPLANT N/A 08/11/2019   Procedure: RADIOACTIVE SEED IMPLANT/BRACHYTHERAPY IMPLANT;  Surgeon: Irine Seal, MD;  Location: [facility];  Service: Urology;  Laterality: N/A;  . SPACE OAR INSTILLATION N/A 08/11/2019   Procedure: SPACE OAR INSTILLATION;  Surgeon: Irine Seal, MD;  Location: [facility];  Service: Urology;  Laterality: N/A;  . TONSILLECTOMY  1958   Family History  Problem Relation Age of Onset  . Gallstones Mother   . Colon cancer Mother   . Lung cancer Mother         discovered near death  . Heart disease Mother        rhythm and mitral valve prolapse  . Alzheimer's disease Father   . Prostate cancer Father        radiation when younger- survived  . Mesothelioma Father   . Heart disease Maternal Grandmother        died at young age from heart issue unkown  . Cancer Maternal Grandfather   . Heart disease Paternal Grandmother        enlarged heart  . Heart attack Paternal Grandfather        age 25  . Nephrolithiasis Sister   . Diabetes Maternal Aunt   . Pancreatic cancer Neg Hx   . Stomach cancer Neg Hx    Social History   Socioeconomic History  . Marital status: Married    Spouse name: Not on file  . Number of children: 2  . Years of education: Not on file  . Highest education level: Not on file  Occupational History    Comment: retired since 2016  Tobacco Use  . Smoking status: Never Smoker  . Smokeless tobacco: Never Used  Substance and Sexual Activity  . Alcohol use: No    Alcohol/week: 0.0 standard drinks  . Drug use: Never  . Sexual activity: Not on file    Comment: vasectomy  Other Topics Concern  . Not on file  Social History Narrative   Married 1975. 2 sons. 4 grandkids- all local (21 to 10 in 2018)      [name]   2 years of college.       Hobbies: enjoys watching football with wife NFL, movies, pool in back yard, grandkids.    Social Determinants of Health   Financial Resource Strain:   . Difficulty of Paying Living Expenses: Not on file  Food Insecurity:   . Worried About Charity fundraiser in the Last Year: Not on file  . Ran Out of Food in the Last Year: Not on file  Transportation Needs:   . Lack of Transportation (Medical): Not on file  . Lack of Transportation (Non-Medical): Not on file  Physical Activity:   . Days of Exercise per Week: Not on file  . Minutes of Exercise per Session: Not on file  Stress:   . Feeling of Stress : Not on file  Social Connections:   . Frequency of Communication  with Friends and Family: Not on file  . Frequency of Social Gatherings with Friends and Family: Not on file  . Attends Religious Services: Not on file  . Active Member of Clubs or Organizations: Not on file  . Attends Archivist Meetings: Not on file  . Marital Status: Not on file   Tobacco Counseling Counseling given: Not Answered   Clinical Intake:  Pre-visit preparation completed: Yes  Pain : No/denies pain  Diabetes: No  How often do you need to have  someone help you when you read instructions, pamphlets, or other written materials from your doctor or pharmacy?: 1 - Never  Interpreter Needed?: No  Information entered by :: Denman George LPN  Activities of Daily Living In your present state of health, do you have any difficulty performing the following activities: 01/28/2020 08/11/2019  Hearing? N N  Vision? N N  Difficulty concentrating or making decisions? N N  Walking or climbing stairs? N N  Dressing or bathing? N N  Doing errands, shopping? N -  Preparing Food and eating ? N -  Using the Toilet? N -  In the past six months, have you accidently leaked urine? N -  Do you have problems with loss of bowel control? N -  Managing your Medications? N -  Managing your Finances? N -  Housekeeping or managing your Housekeeping? N -  Some recent data might be hidden     Immunizations and Health Maintenance Immunization History  Administered Date(s) Administered  . Influenza Split 10/30/2013  . Influenza, High Dose Seasonal PF 09/11/2018, 08/26/2019  . Influenza,inj,Quad PF,6+ Mos 08/03/2014, 09/15/2015, 09/13/2016  . Influenza-Unspecified 11/11/2017  . Pneumococcal Conjugate-13 01/03/2018  . Pneumococcal Polysaccharide-23 01/16/2019  . Pneumococcal-Unspecified 11/22/2006  . Tdap 02/18/2007, 05/16/2017  . Zoster 12/06/2013   There are no preventive care reminders to display for this patient.  Patient Care Team: Marin Olp, MD as PCP - General  (Family Medicine) Cira Rue, RN Nurse Navigator as Registered Nurse ([place]) Juluis Rainier as Consulting Physician (Optometry) Marin Olp, Rudell Cobb, MD as Consulting Physician (Oncology) Pyrtle, Lajuan Lines, MD as Consulting Physician (Gastroenterology) Irine Seal, MD as Consulting Physician (Urology)  Indicate any recent Medical Services you may have received from other than [facility] providers in the past year (date may be approximate).    Assessment:   This is a routine wellness examination for Randall Turner.  Hearing/Vision screen No exam data present  Dietary issues and exercise activities discussed: Current Exercise Habits: Home exercise routine, Type of exercise: walking, Time (Minutes): 30, Frequency (Times/Week): 4, Weekly Exercise (Minutes/Week): 120, Intensity: Mild  Goals   None    Depression Screen PHQ 2/9 Scores 01/28/2020 01/20/2020 01/16/2019 07/04/2018  PHQ - 2 Score 0 0 0 0    Fall Risk Fall Risk  01/28/2020 07/17/2019 07/04/2018 05/16/2017 12/13/2016  Falls in the past year? 0 0 No No No  Number falls in past yr: 0 0 - - -  Injury with Fall? 0 0 - - -  Follow up Falls evaluation completed;Education provided;Falls prevention discussed - - - -    Is the patient's home free of loose throw rugs in walkways, pet beds, electrical cords, etc?   yes      Grab bars in the bathroom? yes      Handrails on the stairs?   yes      Adequate lighting?   yes  Cognitive Function: Cognitive Testing  Alert? Yes         Normal Appearance? N/a  Oriented to person? Yes           Place? Yes  Time? Yes  Recall of three objects? Yes  Can perform simple calculations? Yes  Displays appropriate judgment? Yes  Can read the correct time from a watch face? Yes   Screening Tests Health Maintenance  Topic Date Due  . COLONOSCOPY  01/23/2022  . TETANUS/TDAP  05/17/2027  . INFLUENZA VACCINE  Completed  . Hepatitis C Screening  Completed  . PNA  vac Low Risk Adult  Completed    Qualifies for  Shingles Vaccine? Discussed and patient will check with pharmacy for coverage.  Patient education handout provided   Cancer Screenings: Lung: Low Dose CT Chest recommended if Age 54-80 years, 30 pack-year currently smoking OR have quit w/in 15years. Patient does not qualify. Colorectal: colonoscopy 01/23/17 with Dr. [name]; repeat in 5 years     Plan:  I have personally reviewed and addressed the Medicare Annual Wellness questionnaire and have noted the following in the patient's chart:  A. Medical and social history B. Use of alcohol, tobacco or illicit drugs  C. Current medications and supplements D. Functional ability and status E.  Nutritional status F.  Physical activity G. Advance directives H. List of other physicians I.  Hospitalizations, surgeries, and ER visits in previous 12 months J.  [place] such as hearing and vision if needed, cognitive and depression L. Referrals, records requested, and appointments- none   In addition, I have reviewed and discussed with patient certain preventive protocols, quality metrics, and best practice recommendations. A written personalized care plan for preventive services as well as general preventive health recommendations were provided to patient.   Signed,  Denman George, LPN  Nurse Health Advisor   Nurse Notes: no additional

## 2020-01-28 NOTE — Patient Instructions (Signed)
Randall Turner , Thank you for taking time to come for your Medicare Wellness Visit. I appreciate your ongoing commitment to your health goals. Please review the following plan we discussed and let me know if I can assist you in the future.   Screening recommendations/referrals: Colorectal Screening: up to date; last colonoscopy 01/23/17 with Dr. [name] (repeat in 2023)  Vision and Dental Exams: Recommended annual ophthalmology exams for early detection of glaucoma and other disorders of the eye Recommended annual dental exams for proper oral hygiene  Vaccinations: Influenza vaccine: completed 08/26/19 Pneumococcal vaccine: up to date; last 01/16/19 Tdap vaccine: up to date; last 05/16/17  Shingles vaccine: Please call your insurance company to determine your out of pocket expense for the Shingrix vaccine. You may receive this vaccine at your local pharmacy. (see attached handout)  Covid vaccine: To schedule with other locations try [id number] for [facility] or [email or web address] for [facility]; also see attached handout   Advanced directives: Advance directives discussed with you today. I have provided a copy for you to complete at home and have notarized. Once this is complete please bring a copy in to our office so we can scan it into your chart.  Goals: Recommend to drink at least 6-8 8oz glasses of water per day and consume a balanced diet rich in fresh fruits and vegetables.   Next appointment: Please schedule your Annual Wellness Visit with your Nurse Health Advisor in one year.  Preventive Care 57 Years and Older, Male Preventive care refers to lifestyle choices and visits with your health care provider that can promote health and wellness. What does preventive care include?  A yearly physical exam. This is also called an annual well check.  Dental exams once or twice a year.  Routine eye exams. Ask your health care provider how often you should have your eyes  checked.  Personal lifestyle choices, including:  Daily care of your teeth and gums.  Regular physical activity.  Eating a healthy diet.  Avoiding tobacco and drug use.  Limiting alcohol use.  Practicing safe sex.  Taking low doses of aspirin every day if recommended by your health care provider..  Taking vitamin and mineral supplements as recommended by your health care provider. What happens during an annual well check? The services and screenings done by your health care provider during your annual well check will depend on your age, overall health, lifestyle risk factors, and family history of disease. Counseling  Your health care provider may ask you questions about your:  Alcohol use.  Tobacco use.  Drug use.  Emotional well-being.  Home and relationship well-being.  Sexual activity.  Eating habits.  History of falls.  Memory and ability to understand (cognition).  Work and work Statistician. Screening  You may have the following tests or measurements:  Height, weight, and BMI.  Blood pressure.  Lipid and cholesterol levels. These may be checked every 5 years, or more frequently if you are over 46 years old.  Skin check.  Lung cancer screening. You may have this screening every year starting at age 100 if you have a 30-pack-year history of smoking and currently smoke or have quit within the past 15 years.  Fecal occult blood test (FOBT) of the stool. You may have this test every year starting at age 77.  Flexible sigmoidoscopy or colonoscopy. You may have a sigmoidoscopy every 5 years or a colonoscopy every 10 years starting at age 56.  Prostate cancer screening. Recommendations will vary depending on your  family history and other risks.  Hepatitis C blood test.  Hepatitis B blood test.  Sexually transmitted disease (STD) testing.  Diabetes screening. This is done by checking your blood sugar (glucose) after you have not eaten for a while  (fasting). You may have this done every 1-3 years.  Abdominal aortic aneurysm (AAA) screening. You may need this if you are a current or former smoker.  Osteoporosis. You may be screened starting at age 10 if you are at high risk. Talk with your health care provider about your test results, treatment options, and if necessary, the need for more tests. Vaccines  Your health care provider may recommend certain vaccines, such as:  Influenza vaccine. This is recommended every year.  Tetanus, diphtheria, and acellular pertussis (Tdap, Td) vaccine. You may need a Td booster every 10 years.  Zoster vaccine. You may need this after age 62.  Pneumococcal 13-valent conjugate (PCV13) vaccine. One dose is recommended after age 74.  Pneumococcal polysaccharide (PPSV23) vaccine. One dose is recommended after age 17. Talk to your health care provider about which screenings and vaccines you need and how often you need them. This information is not intended to replace advice given to you by your health care provider. Make sure you discuss any questions you have with your health care provider. Document Released: 01/06/2016 Document Revised: 08/29/2016 Document Reviewed: 10/11/2015 Elsevier Interactive Patient Education  2017 [place] Prevention in the Home Falls can cause injuries. They can happen to people of all ages. There are many things you can do to make your home safe and to help prevent falls. What can I do on the outside of my home?  Regularly fix the edges of walkways and driveways and fix any cracks.  Remove anything that might make you trip as you walk through a door, such as a raised step or threshold.  Trim any bushes or trees on the path to your home.  Use bright outdoor lighting.  Clear any walking paths of anything that might make someone trip, such as rocks or tools.  Regularly check to see if handrails are loose or broken. Make sure that both sides of any steps have  handrails.  Any raised decks and porches should have guardrails on the edges.  Have any leaves, snow, or ice cleared regularly.  Use sand or salt on walking paths during winter.  Clean up any spills in your garage right away. This includes oil or grease spills. What can I do in the bathroom?  Use night lights.  Install grab bars by the toilet and in the tub and shower. Do not use towel bars as grab bars.  Use non-skid mats or decals in the tub or shower.  If you need to sit down in the shower, use a plastic, non-slip stool.  Keep the floor dry. Clean up any water that spills on the floor as soon as it happens.  Remove soap buildup in the tub or shower regularly.  Attach bath mats securely with double-sided non-slip rug tape.  Do not have throw rugs and other things on the floor that can make you trip. What can I do in the bedroom?  Use night lights.  Make sure that you have a light by your bed that is easy to reach.  Do not use any sheets or blankets that are too big for your bed. They should not hang down onto the floor.  Have a firm chair that has side arms. You can  use this for support while you get dressed.  Do not have throw rugs and other things on the floor that can make you trip. What can I do in the kitchen?  Clean up any spills right away.  Avoid walking on wet floors.  Keep items that you use a lot in easy-to-reach places.  If you need to reach something above you, use a strong step stool that has a grab bar.  Keep electrical cords out of the way.  Do not use floor polish or wax that makes floors slippery. If you must use wax, use non-skid floor wax.  Do not have throw rugs and other things on the floor that can make you trip. What can I do with my stairs?  Do not leave any items on the stairs.  Make sure that there are handrails on both sides of the stairs and use them. Fix handrails that are broken or loose. Make sure that handrails are as long as  the stairways.  Check any carpeting to make sure that it is firmly attached to the stairs. Fix any carpet that is loose or worn.  Avoid having throw rugs at the top or bottom of the stairs. If you do have throw rugs, attach them to the floor with carpet tape.  Make sure that you have a light switch at the top of the stairs and the bottom of the stairs. If you do not have them, ask someone to add them for you. What else can I do to help prevent falls?  Wear shoes that:  Do not have high heels.  Have rubber bottoms.  Are comfortable and fit you well.  Are closed at the toe. Do not wear sandals.  If you use a stepladder:  Make sure that it is fully opened. Do not climb a closed stepladder.  Make sure that both sides of the stepladder are locked into place.  Ask someone to hold it for you, if possible.  Clearly mark and make sure that you can see:  Any grab bars or handrails.  First and last steps.  Where the edge of each step is.  Use tools that help you move around (mobility aids) if they are needed. These include:  Canes.  Walkers.  Scooters.  Crutches.  Turn on the lights when you go into a dark area. Replace any light bulbs as soon as they burn out.  Set up your furniture so you have a clear path. Avoid moving your furniture around.  If any of your floors are uneven, fix them.  If there are any pets around you, be aware of where they are.  Review your medicines with your doctor. Some medicines can make you feel dizzy. This can increase your chance of falling. Ask your doctor what other things that you can do to help prevent falls. This information is not intended to replace advice given to you by your health care provider. Make sure you discuss any questions you have with your health care provider. Document Released: 10/06/2009 Document Revised: 05/17/2016 Document Reviewed: 01/14/2015 Elsevier Interactive Patient Education  2017 [employer].

## 2020-02-13 ENCOUNTER — Ambulatory Visit: Payer: Medicare Other | Attending: Internal Medicine

## 2020-02-13 DIAGNOSIS — Z23 Encounter for immunization: Secondary | ICD-10-CM | POA: Insufficient documentation

## 2020-02-13 NOTE — Progress Notes (Signed)
   [id number]  Name:  Randall Turner.    MRN: [id number] DOB: 21-Jul-1951  02/13/2020  Randall Turner was observed post Covid-19 immunization for 15 minutes without incidence. He was provided with Vaccine Information Sheet and instruction to access the V-Safe system.   Randall Turner was instructed to call 911 with any severe reactions post vaccine: Marland Kitchen Difficulty breathing  . Swelling of your face and throat  . A fast heartbeat  . A bad rash all over your body  . Dizziness and weakness    Immunizations Administered    Name Date Dose VIS Date Route   Pfizer COVID-19 Vaccine 02/13/2020 12:02 PM 0.3 mL 12/04/2019 Intramuscular   Manufacturer: [place]   Lot: [id number]   [place]: [id number]

## 2020-03-02 DIAGNOSIS — R3912 Poor urinary stream: Secondary | ICD-10-CM | POA: Diagnosis not present

## 2020-03-08 ENCOUNTER — Ambulatory Visit: Payer: Medicare Other | Attending: Internal Medicine

## 2020-03-08 DIAGNOSIS — Z23 Encounter for immunization: Secondary | ICD-10-CM

## 2020-03-08 NOTE — Progress Notes (Signed)
   [id number] [facility]  Name:  Randall Turner.    MRN: [id number] DOB: 1951/07/03  03/08/2020  [name] was observed post Covid-19 immunization for 15 minutes without incident. He was provided with Vaccine Information Sheet and instruction to access the V-Safe system.   [name] was instructed to call 911 with any severe reactions post vaccine: Marland Kitchen Difficulty breathing  . Swelling of face and throat  . A fast heartbeat  . A bad rash all over body  . Dizziness and weakness   Immunizations Administered    Name Date Dose VIS Date Route   Pfizer COVID-19 Vaccine 03/08/2020 12:06 PM 0.3 mL 12/04/2019 Intramuscular   Manufacturer: [place]   Lot: [id number]   [place]: [id number]

## 2020-06-10 ENCOUNTER — Other Ambulatory Visit: Payer: Self-pay | Admitting: Family Medicine

## 2020-06-15 ENCOUNTER — Inpatient Hospital Stay: Payer: Medicare Other | Attending: Hematology & Oncology

## 2020-06-15 ENCOUNTER — Other Ambulatory Visit: Payer: Self-pay

## 2020-06-15 ENCOUNTER — Encounter: Payer: Self-pay | Admitting: *Deleted

## 2020-06-15 LAB — IRON AND TIBC
Iron: 120 ug/dL (ref 42–163)
Saturation Ratios: 35 % (ref 20–55)
TIBC: 348 ug/dL (ref 202–409)
UIBC: 227 ug/dL (ref 117–376)

## 2020-06-15 LAB — CBC WITH DIFFERENTIAL (CANCER CENTER ONLY)
Abs Immature Granulocytes: 0.01 10*3/uL (ref 0.00–0.07)
Basophils Absolute: 0.1 10*3/uL (ref 0.0–0.1)
Basophils Relative: 1 %
Eosinophils Absolute: 0.3 10*3/uL (ref 0.0–0.5)
Eosinophils Relative: 5 %
HCT: 45.9 % (ref 39.0–52.0)
Hemoglobin: 15.7 g/dL (ref 13.0–17.0)
Immature Granulocytes: 0 %
Lymphocytes Relative: 24 %
Lymphs Abs: 1.2 10*3/uL (ref 0.7–4.0)
MCH: 31 pg (ref 26.0–34.0)
MCHC: 34.2 g/dL (ref 30.0–36.0)
MCV: 90.5 fL (ref 80.0–100.0)
Monocytes Absolute: 0.6 10*3/uL (ref 0.1–1.0)
Monocytes Relative: 12 %
Neutro Abs: 2.8 10*3/uL (ref 1.7–7.7)
Neutrophils Relative %: 58 %
Platelet Count: 177 10*3/uL (ref 150–400)
RBC: 5.07 MIL/uL (ref 4.22–5.81)
RDW: 12.9 % (ref 11.5–15.5)
WBC Count: 4.9 10*3/uL (ref 4.0–10.5)
nRBC: 0 % (ref 0.0–0.2)

## 2020-06-15 LAB — CMP (CANCER CENTER ONLY)
ALT: 29 U/L (ref 0–44)
AST: 23 U/L (ref 15–41)
Albumin: 4.3 g/dL (ref 3.5–5.0)
Alkaline Phosphatase: 96 U/L (ref 38–126)
Anion gap: 9 (ref 5–15)
BUN: 17 mg/dL (ref 8–23)
CO2: 24 mmol/L (ref 22–32)
Calcium: 9.2 mg/dL (ref 8.9–10.3)
Chloride: 107 mmol/L (ref 98–111)
Creatinine: 1.11 mg/dL (ref 0.61–1.24)
GFR, Est AFR Am: >60 mL/min (ref >60)
GFR, Estimated: >60 mL/min (ref >60)
Glucose, Bld: 102 mg/dL — ABNORMAL HIGH (ref 70–99)
Potassium: 4.5 mmol/L (ref 3.5–5.1)
Sodium: 140 mmol/L (ref 135–145)
Total Bilirubin: 1.3 mg/dL — ABNORMAL HIGH (ref 0.3–1.2)
Total Protein: 6.8 g/dL (ref 6.5–8.1)

## 2020-06-15 LAB — FERRITIN: Ferritin: 29 ng/mL (ref 24–336)

## 2020-06-17 ENCOUNTER — Inpatient Hospital Stay: Payer: Medicare Other | Admitting: Hematology & Oncology

## 2020-06-17 ENCOUNTER — Other Ambulatory Visit: Payer: Self-pay

## 2020-06-17 ENCOUNTER — Encounter: Payer: Self-pay | Admitting: Hematology & Oncology

## 2020-06-17 NOTE — Progress Notes (Signed)
Hematology and Oncology Follow Up Visit  Randall Turner [id number] January 31, 1951 69 y.o. 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    Interim History:  Randall Turner is here today for follow-up. He is doing quite well.  We see him yearly.  He did have the prostate cancer last year.  He did undergo seed implant.  He has had no problems with the hemochromatosis.  He had lab work done couple days ago.  His ferritin was only 27 with an iron saturation of 34%.  There has been no problems with fever.  He has had no cough or shortness of breath.  He has had no nausea or vomiting.  He says that his urination stream is a little bit better since having the prostate cancer treatment.  He and his wife will be headed up to [place] to visit a son.  Their son is a Engineer, structural in [place].  I give him all the creatinine world for the sacrifices that he makes to keep [place] safe.  Overall, his performance status is ECOG 0.    Medications:  Allergies as of 06/17/2020      Reactions   Cortisone Other (See Comments)   Topical cortisone caused swelling      Medication List       Accurate as of June 17, 2020  9:58 AM. If you have any questions, ask your nurse or doctor.        Albuterol Sulfate 108 (90 Base) MCG/ACT Aepb Commonly known as: ProAir RespiClick Inhale 2 puffs into the lungs every 4 (four) hours as needed. What changed: reasons to take this   aspirin EC 81 MG tablet Take 162 mg by mouth daily.   losartan 50 MG tablet Commonly known as: COZAAR TAKE 1 TABLET BY MOUTH  DAILY   naproxen sodium 220 MG tablet Commonly known as: ALEVE Take 220 mg by mouth 2 (two) times daily as needed.   omeprazole 20 MG capsule Commonly known as: PRILOSEC TAKE 1 CAPSULE BY MOUTH  ONCE DAILY   OVER THE COUNTER MEDICATION Take 1  tablet by mouth. Mens multivitamin without iron   pravastatin 20 MG tablet Commonly known as: PRAVACHOL Take 1 tablet (20 mg total) by mouth daily.       Allergies:  Allergies  Allergen Reactions  . Cortisone Other (See Comments)    Topical cortisone caused swelling    Past Medical History, Surgical history, Social history, and Family History were reviewed and updated.  Review of Systems: Review of Systems  Constitutional: Negative.   HENT: Negative.   Eyes: Negative.   Respiratory: Negative.   Cardiovascular: Negative.   Gastrointestinal: Negative.   Genitourinary: Negative.   Musculoskeletal: Negative.   Skin: Negative.   Neurological: Negative.   Endo/Heme/Allergies: Negative.   Psychiatric/Behavioral: Negative.       Physical Exam:  weight is 226 lb (102.5 kg). His temporal temperature is 96.8 F (36 C) (abnormal). His blood pressure is 135/92 (abnormal) and his pulse is 67. His respiration is 18 and oxygen saturation is 95%.   Wt Readings from Last 3 Encounters:  06/17/20 226 lb (102.5 kg)  01/20/20 229 lb (103.9 kg)  09/03/19 235 lb 4 oz (106.7 kg)    Physical Exam Vitals reviewed.  HENT:     Head: Normocephalic and atraumatic.  Eyes:     Pupils: Pupils are equal, round, and reactive to light.  Cardiovascular:     Rate and Rhythm: Normal rate and regular rhythm.     Heart sounds: Normal heart sounds.  Pulmonary:     Effort: Pulmonary effort is normal.     Breath sounds: Normal breath sounds.  Abdominal:     General: Bowel sounds are normal.     Palpations: Abdomen is soft.  Musculoskeletal:        General: No tenderness or deformity. Normal range of motion.     Cervical back: Normal range of motion.  Lymphadenopathy:     Cervical: No cervical adenopathy.  Skin:    General: Skin is warm and dry.     Findings: No erythema or rash.  Neurological:     Mental Status: He is alert and oriented to person, place, and time.  Psychiatric:         Behavior: Behavior normal.        Thought Content: Thought content normal.        Judgment: Judgment normal.       Lab Results  Component Value Date   WBC 4.9 06/15/2020   HGB 15.7 06/15/2020   HCT 45.9 06/15/2020   MCV 90.5 06/15/2020   PLT 177 06/15/2020   Lab Results  Component Value Date   FERRITIN 29 06/15/2020   IRON 120 06/15/2020   TIBC 348 06/15/2020   UIBC 227 06/15/2020   IRONPCTSAT 35 06/15/2020   Lab Results  Component Value Date   RBC 5.07 06/15/2020   No results found for: KPAFRELGTCHN, LAMBDASER, KAPLAMBRATIO No results found for: IGGSERUM, IGA, IGMSERUM No results found for: Odetta Pink, SPEI   Chemistry      Component Value Date/Time   NA 140 06/15/2020 1049   NA 141 06/11/2017 1401   K 4.5 06/15/2020 1049   K 4.3 06/11/2017 1401   CL 107 06/15/2020 1049   CO2 24 06/15/2020 1049   CO2 25 06/11/2017 1401   BUN 17 06/15/2020 1049   BUN 18.8 06/11/2017 1401   CREATININE 1.11 06/15/2020 1049   CREATININE 1.2 06/11/2017 1401      Component Value Date/Time   CALCIUM 9.2 06/15/2020 1049   CALCIUM 10.1 06/11/2017 1401   ALKPHOS 96 06/15/2020 1049   ALKPHOS 122 06/11/2017 1401   AST 23 06/15/2020 1049   AST 32 06/11/2017 1401   ALT 29 06/15/2020 1049   ALT 53 06/11/2017 1401   BILITOT 1.3 (H) 06/15/2020 1049   BILITOT 0.89 06/11/2017 1401     Impression and Plan: Randall Turner is a 69 yo gentleman with hemochromatosis - compound heterozygous. His last phlebotomy was in December of 2016.  From my point of view, everything looks quite good. We do not have to phlebotomize him.  I think we can see him back in 1 year. I do not see anything that suggests that the hemochromatosis will be a problem.   Volanda Napoleon, MD 6/25/20219:58 AM

## 2020-07-09 ENCOUNTER — Other Ambulatory Visit: Payer: Self-pay | Admitting: Internal Medicine

## 2020-07-09 DIAGNOSIS — K219 Gastro-esophageal reflux disease without esophagitis: Secondary | ICD-10-CM

## 2020-07-14 IMAGING — DX DG ANKLE COMPLETE 3+V*R*
3 series · 3 of 3 positions shown · non-contrast
Comparison: None.

CLINICAL DATA: Twisted ankle, fell in driveway last night. Lateral
ankle pain.

EXAM:
RIGHT ANKLE - COMPLETE 3+ VIEW; RIGHT FOOT COMPLETE - 3+ VIEW

[ankle ap]
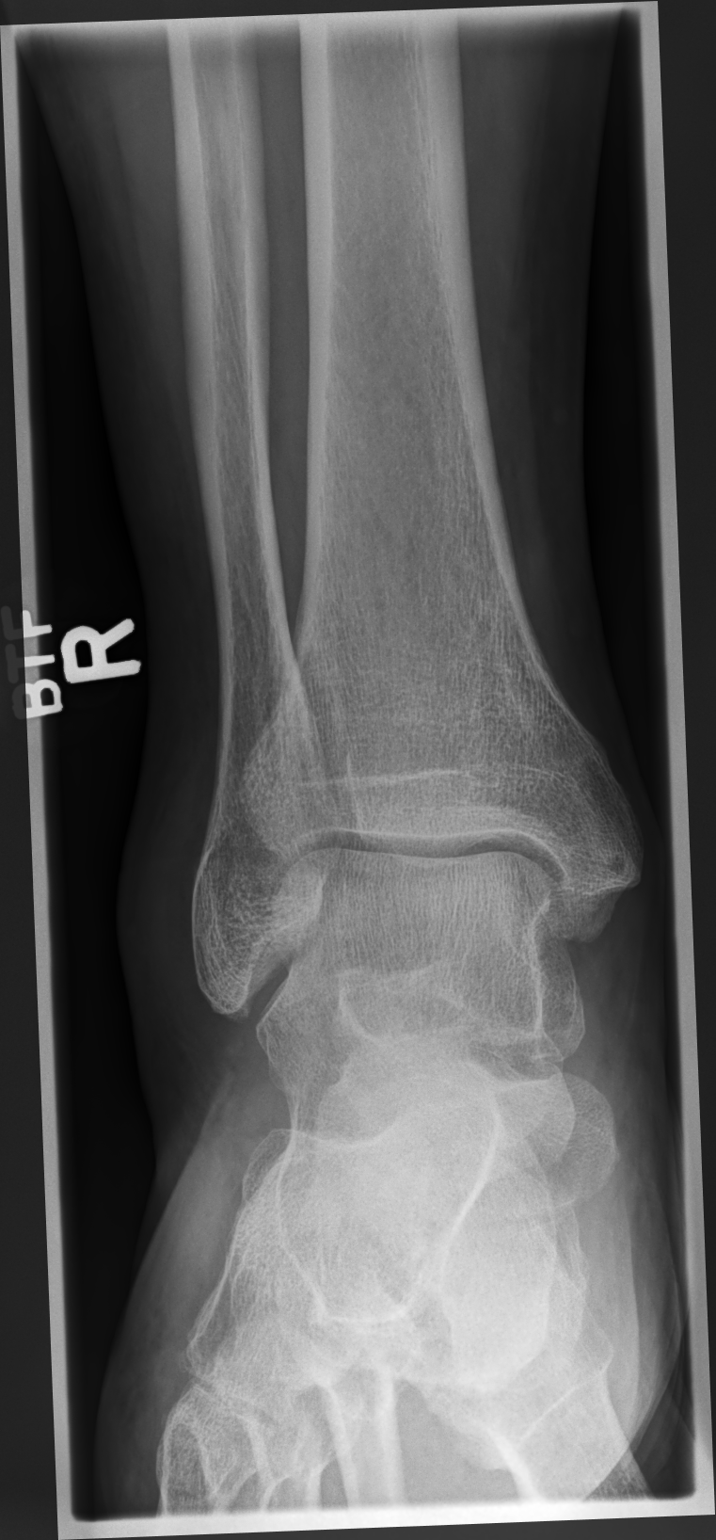

[ankle oblique]
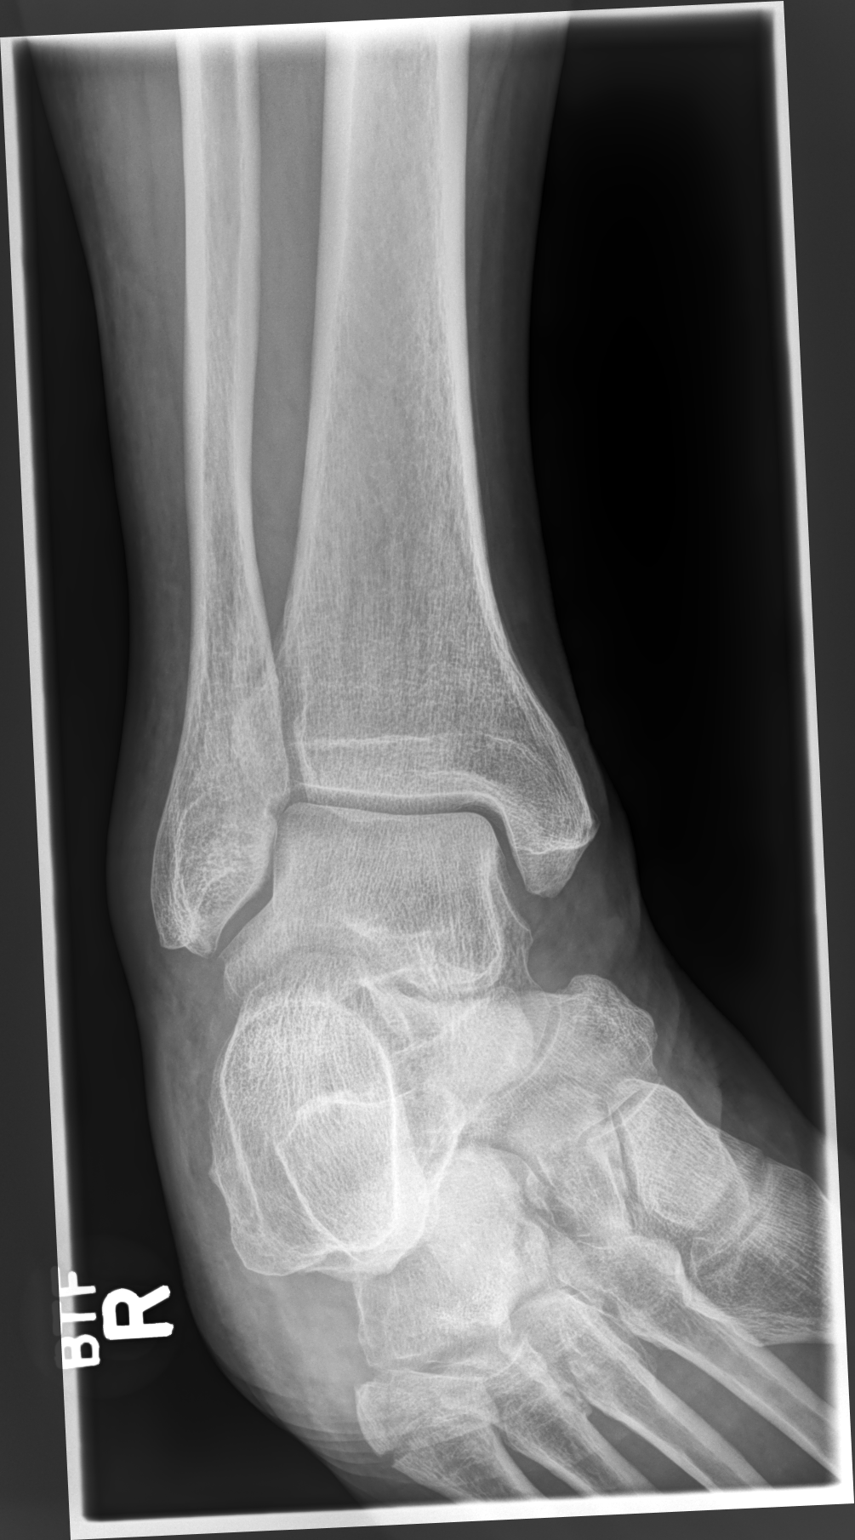

[ankle lat]
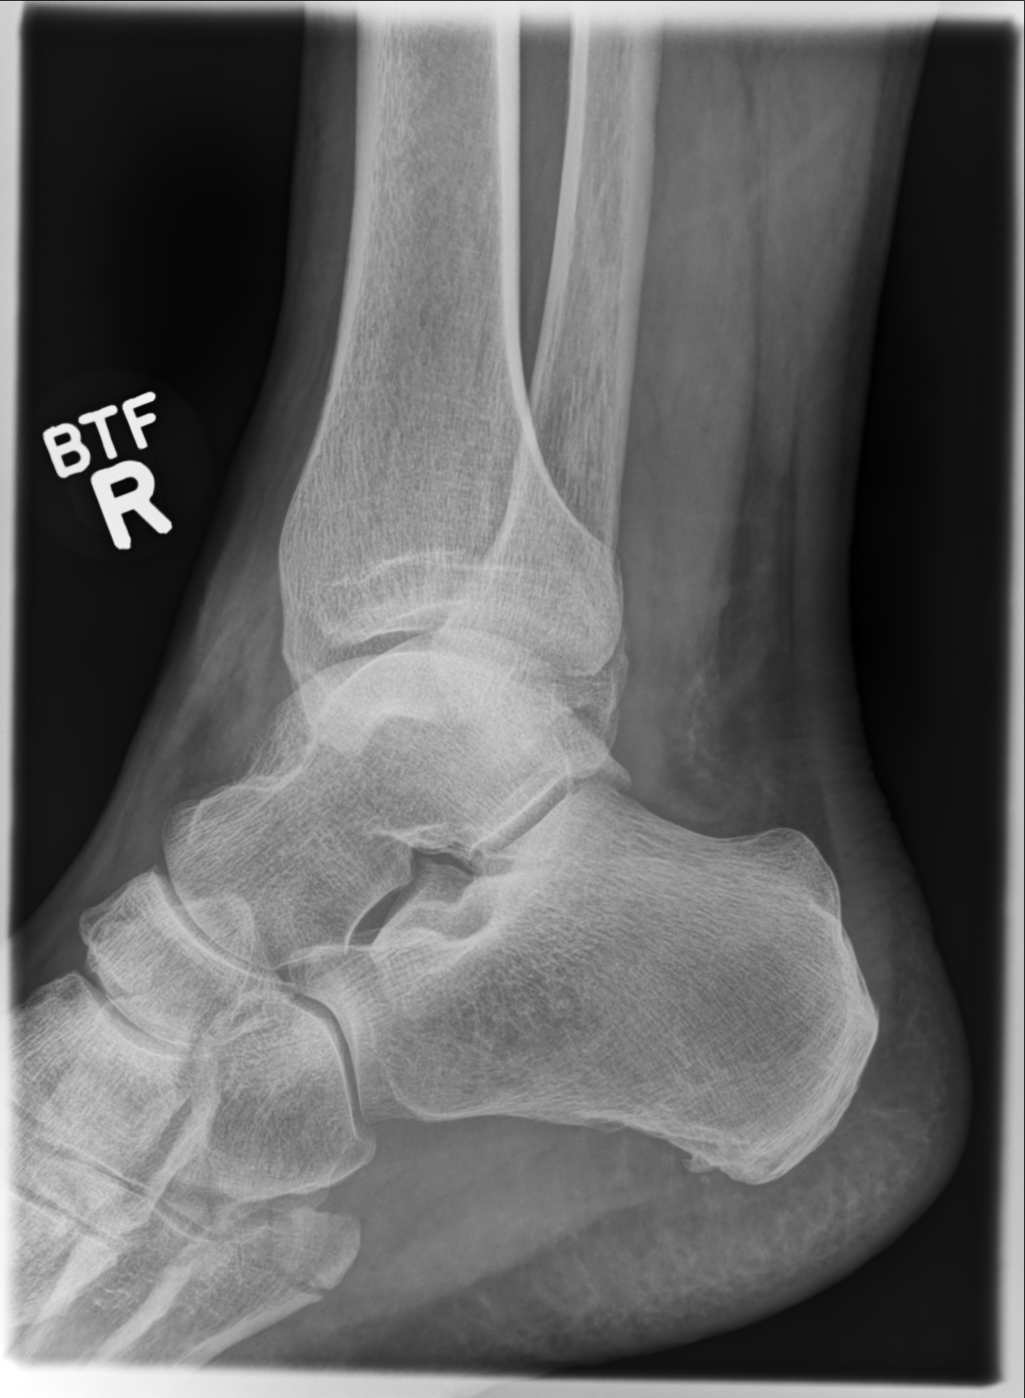

[3 of 3 positions shown; findings below may reference images not displayed]

FINDINGS: RIGHT foot: Acute nondisplaced oblique fracture through base of
fifth metatarsus without distraction. No dislocation. Moderate to
severe first metatarsal phalangeal joint space narrowing with
lateral periarticular spurring. No destructive bony lesions. Soft
tissue planes are non suspicious.

Ankle: No fracture deformity nor dislocation. The ankle mortise
appears congruent and the tibiofibular syndesmosis intact. No
destructive bony lesions. Soft tissue planes are non-suspicious.
IMPRESSION: 1. Acute nondisplaced base of fifth metatarsus fracture.
2. Moderate to severe first MTP osteoarthrosis.
3. These results will be called to the ordering clinician or
representative by the professional radiologist assistant, and
communication documented in zVision Dashboard.

## 2020-07-18 NOTE — Progress Notes (Signed)
Phone [phone] In person visit   Subjective:   Randall Turner. is a 69 y.o. year old very pleasant male patient who presents for/with See problem oriented charting Chief Complaint  Patient presents with  . Follow-up  . Hypertension  . Hyperlipidemia    This visit occurred during the SARS-CoV-2 public health emergency.  Safety protocols were in place, including screening questions prior to the visit, additional usage of staff PPE, and extensive cleaning of exam room while observing appropriate contact time as indicated for disinfecting solutions.   Past Medical History-  Patient Active Problem List   Diagnosis Date Noted  . Malignant neoplasm of prostate ([place]) 05/05/2019    Priority: High  . 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 ([place]) 11/30/2015    Priority: High  . Elevated LFTs 07/17/2019    Priority: Medium  . Asthma 08/09/2017    Priority: Medium  . Essential hypertension 09/15/2015    Priority: Medium  . Hyperglycemia 07/28/2013    Priority: Medium  . Hyperlipidemia 07/28/2013    Priority: Medium  . Esophageal reflux 07/28/2013    Priority: Medium  . History of adenomatous polyp of colon 08/09/2017    Priority: Low  . Closed fracture of fifth metatarsal bone of right foot 11/24/2018  . Right foot pain 11/24/2018    Medications- reviewed and updated Current Outpatient Medications  Medication Sig Dispense Refill  . Albuterol Sulfate (PROAIR RESPICLICK) 536 (90 Base) MCG/ACT AEPB Inhale 2 puffs into the lungs every 4 (four) hours as needed (asthma). 1 each 5  . aspirin EC 81 MG tablet Take 162 mg by mouth daily.    Marland Kitchen losartan (COZAAR) 50 MG tablet TAKE 1 TABLET BY MOUTH  DAILY 90 tablet 3  . naproxen sodium (ALEVE) 220 MG tablet Take 220 mg by mouth 2 (two) times daily as needed.    Marland Kitchen omeprazole (PRILOSEC) 20 MG capsule TAKE 1 CAPSULE BY MOUTH  ONCE DAILY. 90 capsule 3  . OVER THE COUNTER MEDICATION Take 1 tablet by  mouth. Mens multivitamin without iron    . pravastatin (PRAVACHOL) 20 MG tablet Take 1 tablet (20 mg total) by mouth daily. 90 tablet 3   No current facility-administered medications for this visit.     Objective:  BP 128/82   Pulse 72   Temp 98.1 F (36.7 C)   Ht 5\' 11"  (1.803 m)   Wt (!) 224 lb (101.6 kg)   SpO2 96%   BMI 31.24 kg/m  Gen: NAD, resting comfortably CV: RRR no murmurs rubs or gallops Lungs: CTAB no crackles, wheeze, rhonchi Abdomen: soft/nontender/nondistended/normal bowel sounds.  Ext: no edema Skin: warm, dry    Assessment and Plan   #  prostate cancer- follows with DR. Wrenn. S/p radioactive seed implants. Sees them again in September- digital exam planned. Has another PSA planned at that time as well.  Lab Results  Component Value Date   PSA 2.76 11/09/2019   PSA 3.47 04/07/2019   PSA 4.16 (H) 01/16/2019    #Hemochromatosis-follows with [name] S: Ferritin goal under 100.  Has had mild LFT elevation in the past-monitoring as long as remains under 100. Lab Results  Component Value Date   FERRITIN 29 06/15/2020  A/P: no phlebotomy in several years. Continue to monitor and follow up with [name].     Lab Results  Component Value Date   ALT 29 06/15/2020   AST 23 06/15/2020   ALKPHOS 96 06/15/2020   BILITOT  1.3 (H) 06/15/2020  last LFTs largely normal other than slightly high bilirubin which he has had intermittently.    #Hypertension S: Controlled on losartan 50 mg BP Readings from Last 3 Encounters:  07/20/20 128/82  06/17/20 (!) 135/92  01/20/20 130/84  A/P: Stable. Continue current medications.      #Hyperlipidemia S: Compliant with pravastatin 20 mg. Takes asa 162mg  Lab Results  Component Value Date   CHOL 135 01/20/2020   HDL 51.80 01/20/2020   LDLCALC 47 01/20/2020   LDLDIRECT 87.0 01/16/2019   TRIG 178.0 (H) 01/20/2020   CHOLHDL 3 01/20/2020  A/P: reasonable control- continue current meds     #Hyperglycemia S: CBGs  high in the past.  A1c not elevated. CBG stable in low 100s fasting. Continued slow increase in weight- watching diet. Could increase exercise.  Wants to work slowly towards 180-185 Lab Results  Component Value Date   HGBA1C 5.5 01/16/2019  A/P: stable on lower end of cbg elevations in prediabetes range- Wants to work slowly towards 180-185. Encouraged him to work towards this.       #Asthma S: No controller.  Sparingly uses albuterol- he prefers to use preventative and takes before bed but has had no flares- this regimen simply seems to work for him .slight cough when lays down better wit albuterol A/P: reasonable control- will refill albuterol- discussed could trial off the albuterol before bed but thinks he would have a slight irritating cough. Losartan could contribute to cough as well  . gerd could also contribute   #GERD S: History of Schatzki's ring. Remains on omeprazole. No recent DR. Pyrtle visits Lab Results  Component Value Date   [id number] 373 01/16/2019  A/P: due to history of schatzki's ring strongly prefers to continue this- I refilled this today.     Recommended follow up: Return in about 6 months (around 01/20/2021) for physical or sooner if needed. Future Appointments  Date Time Provider [place]  06/14/2021  9:00 AM [facility] ONLY [facility] None  06/16/2021  9:00 AM Ennever, Rudell Cobb, MD [facility] None   Lab/Order associations:   ICD-10-CM   1. Essential hypertension  I10   2. Mild intermittent asthma without complication  S28.76   3. Hyperglycemia  R73.9   4. Hyperlipidemia, unspecified hyperlipidemia type  E78.5   5. Gastroesophageal reflux disease  K21.9 omeprazole (PRILOSEC) 20 MG capsule    Meds ordered this encounter  Medications  . Albuterol Sulfate (PROAIR RESPICLICK) 811 (90 Base) MCG/ACT AEPB    Sig: Inhale 2 puffs into the lungs every 4 (four) hours as needed (asthma).    Dispense:  1 each    Refill:  5  . omeprazole (PRILOSEC) 20 MG  capsule    Sig: TAKE 1 CAPSULE BY MOUTH  ONCE DAILY.    Dispense:  90 capsule    Refill:  3    Requesting 1 year supply   Return precautions advised.  Garret Reddish, MD

## 2020-07-18 NOTE — Patient Instructions (Addendum)
No changes today. Glad you are doing well! Keep up the great job with slow steady weight loss.

## 2020-07-20 ENCOUNTER — Other Ambulatory Visit: Payer: Self-pay

## 2020-07-20 ENCOUNTER — Encounter: Payer: Self-pay | Admitting: Family Medicine

## 2020-07-20 ENCOUNTER — Ambulatory Visit (INDEPENDENT_AMBULATORY_CARE_PROVIDER_SITE_OTHER): Payer: Medicare Other | Admitting: Family Medicine

## 2020-07-20 VITALS — BP 128/82 | HR 72 | Temp 98.1°F | Ht 71.0 in | Wt 224.0 lb

## 2020-07-20 DIAGNOSIS — K219 Gastro-esophageal reflux disease without esophagitis: Secondary | ICD-10-CM

## 2020-07-20 DIAGNOSIS — R739 Hyperglycemia, unspecified: Secondary | ICD-10-CM

## 2020-07-20 DIAGNOSIS — E785 Hyperlipidemia, unspecified: Secondary | ICD-10-CM

## 2020-07-20 DIAGNOSIS — C61 Malignant neoplasm of prostate: Secondary | ICD-10-CM

## 2020-07-20 DIAGNOSIS — J4520 Mild intermittent asthma, uncomplicated: Secondary | ICD-10-CM

## 2020-07-20 DIAGNOSIS — I10 Essential (primary) hypertension: Secondary | ICD-10-CM

## 2020-07-20 MED ORDER — OMEPRAZOLE 20 MG PO CPDR: DELAYED_RELEASE_CAPSULE | ORAL | 3 refills | Status: DC

## 2020-07-20 MED ORDER — PROAIR RESPICLICK 108 (90 BASE) MCG/ACT IN AEPB: 2.0000 | INHALATION_SPRAY | RESPIRATORY_TRACT | 5 refills | Status: DC | PRN

## 2020-07-28 ENCOUNTER — Telehealth: Payer: Self-pay | Admitting: Family Medicine

## 2020-07-28 IMAGING — DX DG FOOT 2V*R*
2 series · 2 of 2 positions shown · non-contrast
Comparison: Plain film of the RIGHT foot dated 11/10/2018.

CLINICAL DATA: Follow-up fracture.

EXAM:
RIGHT FOOT - 2 VIEW

[foot dp]
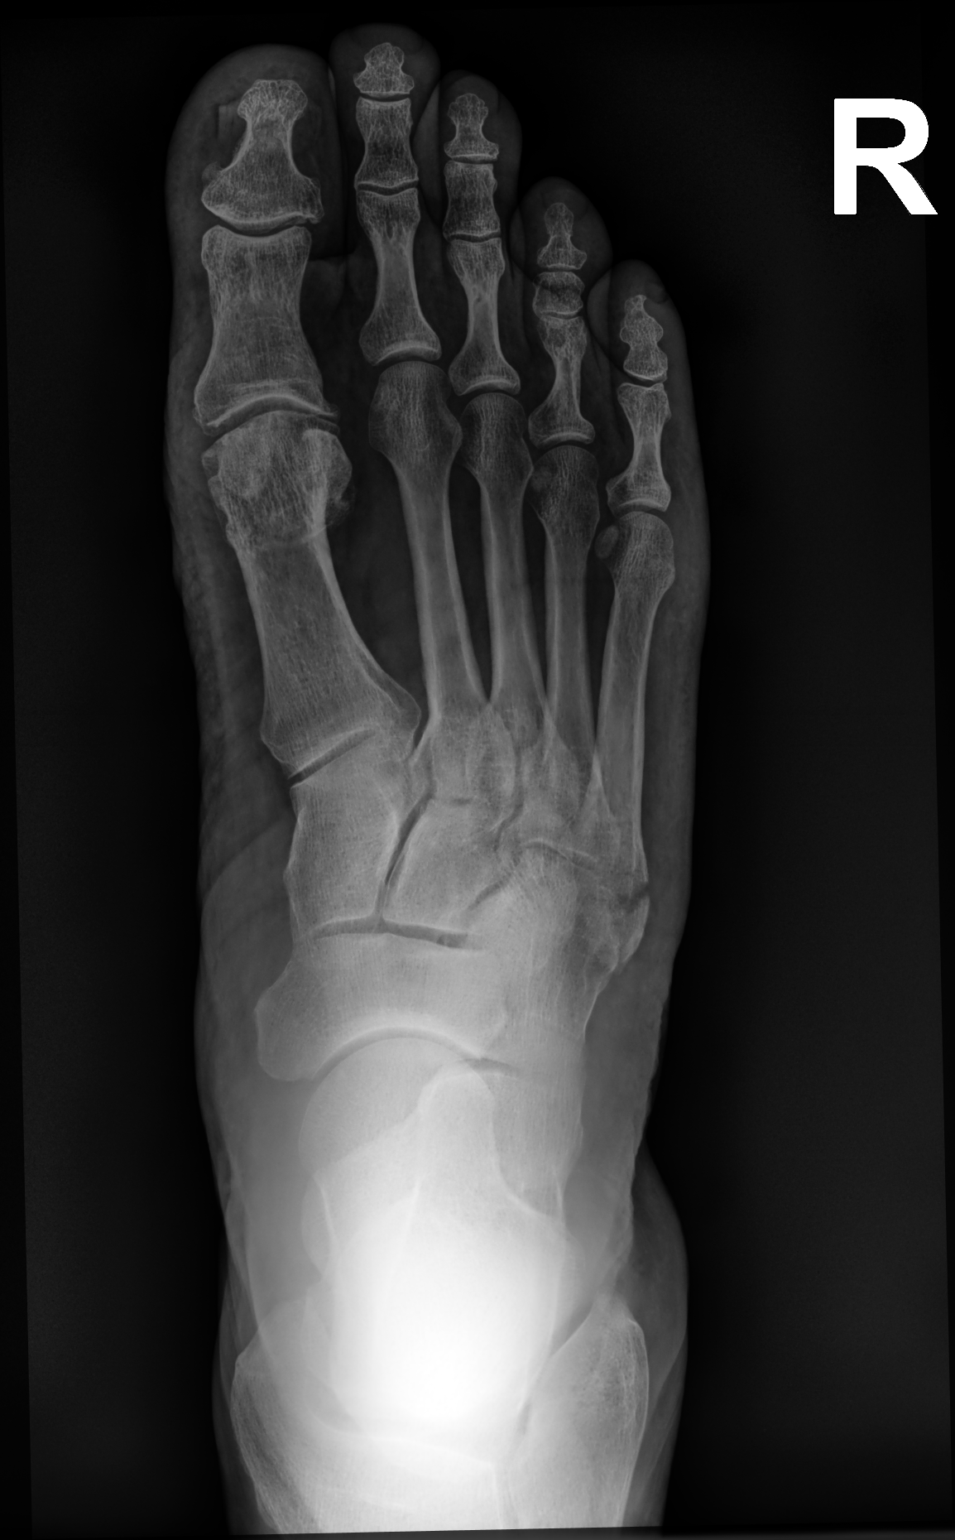

[foot lat]
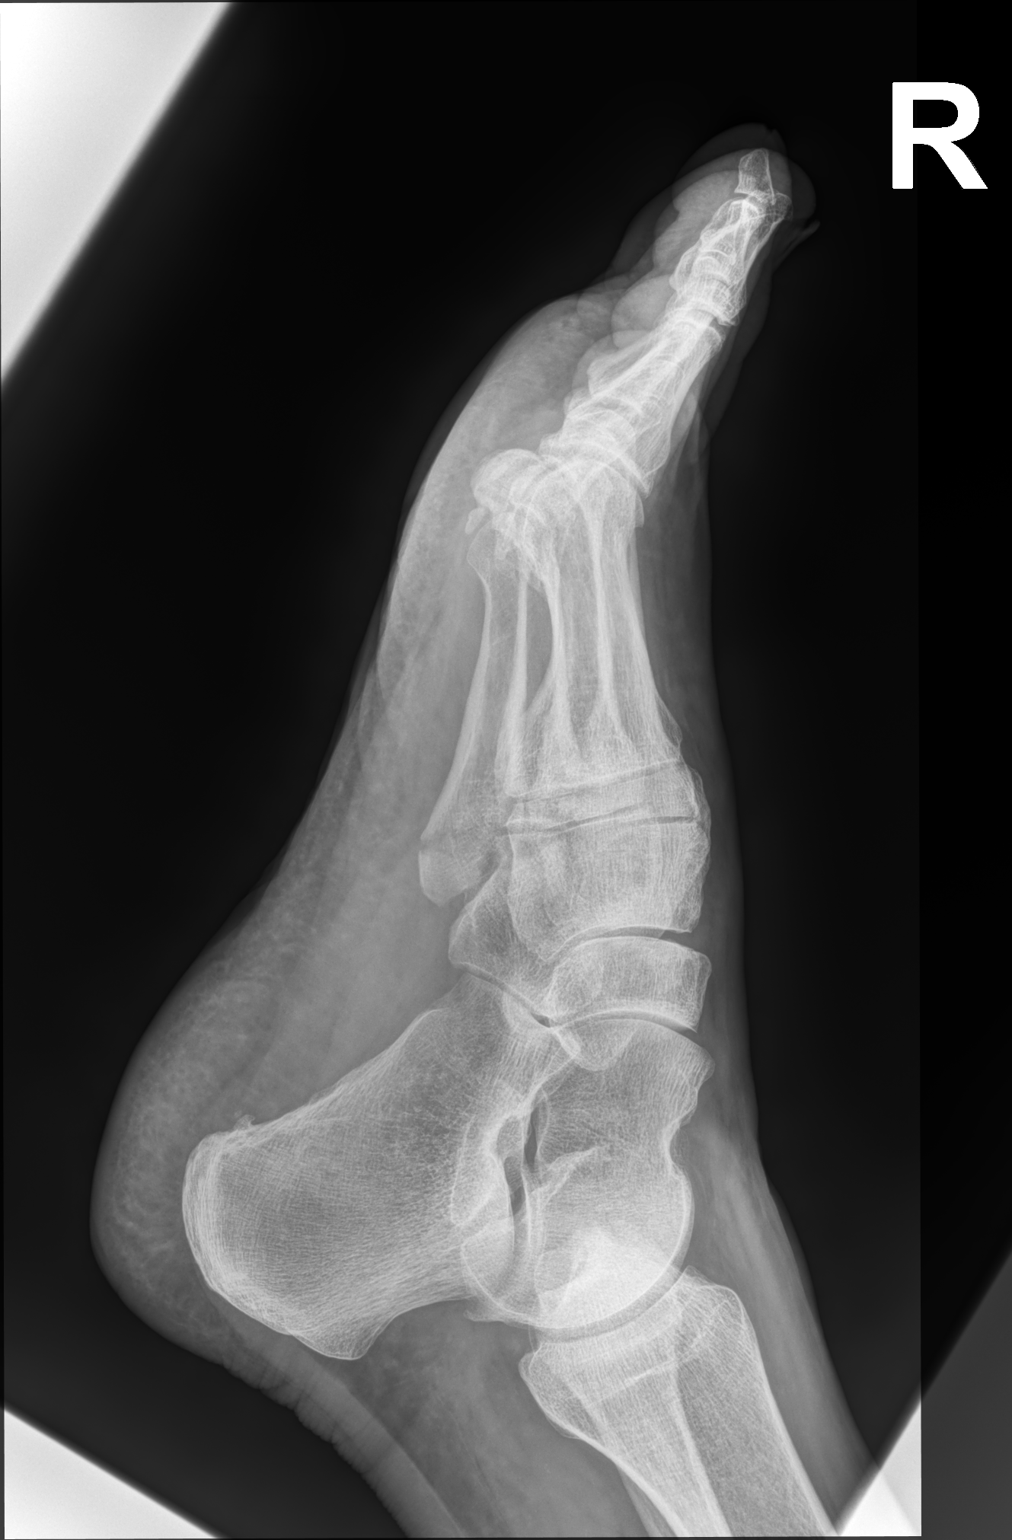

[2 of 2 positions shown; findings below may reference images not displayed]

FINDINGS: Interval slight widening at the lateral margin of the fracture
within the proximal fifth metatarsal. Remainder of the osseous
structures remain intact and normally aligned.
IMPRESSION: Interval slight widening at the lateral margin of the slightly
displaced fracture within the fifth metatarsal base. No healing
callus formation seen as yet.

## 2020-07-28 NOTE — Telephone Encounter (Signed)
Noted  

## 2020-07-28 NOTE — Telephone Encounter (Signed)
Patient is calling in stating that he is going to have his Albuterol Sulfate (PROAIR RESPICLICK) 092 (90 Base) MCG/ACT AEPB filled through [employer] [facility]. Patient has canceled the other prescriptions but wanted Dr.Hunter to aware

## 2020-08-01 ENCOUNTER — Other Ambulatory Visit: Payer: Self-pay

## 2020-08-01 MED ORDER — PROAIR RESPICLICK 108 (90 BASE) MCG/ACT IN AEPB: 2.0000 | INHALATION_SPRAY | RESPIRATORY_TRACT | 5 refills | Status: DC | PRN

## 2020-09-07 DIAGNOSIS — R3912 Poor urinary stream: Secondary | ICD-10-CM | POA: Diagnosis not present

## 2020-09-15 ENCOUNTER — Other Ambulatory Visit: Payer: Self-pay

## 2020-09-15 ENCOUNTER — Encounter: Payer: Self-pay | Admitting: Family Medicine

## 2020-09-15 ENCOUNTER — Ambulatory Visit (INDEPENDENT_AMBULATORY_CARE_PROVIDER_SITE_OTHER): Payer: Medicare Other

## 2020-09-15 DIAGNOSIS — Z23 Encounter for immunization: Secondary | ICD-10-CM | POA: Diagnosis not present

## 2020-10-16 ENCOUNTER — Other Ambulatory Visit: Payer: Self-pay | Admitting: Family Medicine

## 2020-11-24 ENCOUNTER — Other Ambulatory Visit: Payer: Self-pay | Admitting: Family Medicine

## 2021-01-19 NOTE — Patient Instructions (Addendum)
Please stop by lab before you go If you have mychart- we will send your results within 3 business days of [place] receiving them.  If you do not have mychart- we will call you about results within 5 business days of [place] receiving them.  *please also note that you will see labs on mychart as soon as they post. I will later go in and write notes on them- will say "notes from Dr. Yong Channel"  Get updated eye exam  Glad you already had zostavax but can boost further by doing Shingrix.  Please check with your pharmacy to see if they have the shingrix vaccine. If they do- please get this immunization and update [place] by phone call or mychart with dates you receive the vaccine  Could consider voltaren gel instead of naproxen  Recommended follow up: Return in about 6 months (around 07/20/2021) for follow up- or sooner if needed.

## 2021-01-19 NOTE — Progress Notes (Signed)
Phone: [phone]   Subjective:  Patient presents today for their annual physical. Chief complaint-noted.   See problem oriented charting- ROS- full  review of systems was completed and negative  except for: throat congestion likely associated with reflux  The following were reviewed and entered/updated in epic: Past Medical History:  Diagnosis Date  . Arthritis   . Asthma    followed by pcp  . Family history of adverse reaction to anesthesia    per pt his son age 43 w/ hand surgery done approx late 1980s/early 1990s,  post op amnesia;  son has had surgery since with no issues  . Fatty liver    followed by pcp-- per pt told by pcp blood work better  . GERD (gastroesophageal reflux disease)   . 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 Hiatal hernia   . History of adenomatous polyp of colon   . History of kidney stones   . Hyperlipidemia   . Hypertension   . Internal hemorrhoids   . Prostate cancer [facility]) urologist-- dr wrenn/ oncologist-- dr Tammi Klippel   dx 04-13-2019--- Stage T1c, Gleason 3+3  . Wears glasses    Patient Active Problem List   Diagnosis Date Noted  . Malignant neoplasm of prostate ([place]) 05/05/2019    Priority: High  . 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 Elevated LFTs 07/17/2019    Priority: Medium  . Asthma 08/09/2017    Priority: Medium  . Essential hypertension 09/15/2015    Priority: Medium  . Hyperglycemia 07/28/2013    Priority: Medium  . Hyperlipidemia 07/28/2013    Priority: Medium  . Esophageal reflux 07/28/2013    Priority: Medium  . History of adenomatous polyp of colon 08/09/2017    Priority: Low  . Closed fracture of fifth metatarsal bone of right foot 11/24/2018  . Right foot pain 11/24/2018   Past Surgical History:   Procedure Laterality Date  . BUNIONECTOMY Bilateral 2005  . COLONOSCOPY  last one 2018  . CYSTOSCOPY N/A 08/11/2019   Procedure: CYSTOSCOPY;  Surgeon: Irine Seal, MD;  Location: [facility];  Service: Urology;  Laterality: N/A;  . PATELLECTOMY Right 1969   football injury   . RADIOACTIVE SEED IMPLANT N/A 08/11/2019   Procedure: RADIOACTIVE SEED IMPLANT/BRACHYTHERAPY IMPLANT;  Surgeon: Irine Seal, MD;  Location: [facility];  Service: Urology;  Laterality: N/A;  . SPACE OAR INSTILLATION N/A 08/11/2019   Procedure: SPACE OAR INSTILLATION;  Surgeon: Irine Seal, MD;  Location: [facility];  Service: Urology;  Laterality: N/A;  . TONSILLECTOMY  1958    Family History  Problem Relation Age of Onset  . Gallstones Mother   . Colon cancer Mother   . Lung cancer Mother        discovered near death  . Heart disease Mother        rhythm and mitral valve prolapse  . Alzheimer's disease Father   . Prostate cancer Father        radiation when younger- survived  . Mesothelioma Father   . Heart disease Maternal Grandmother        died at young age from heart issue unkown  . Cancer Maternal Grandfather   . Heart disease Paternal Grandmother        enlarged heart  .  Heart attack Paternal Grandfather        age 57  . Nephrolithiasis Sister   . Diabetes Maternal Aunt   . Pancreatic cancer Neg Hx   . Stomach cancer Neg Hx     Medications- reviewed and updated Current Outpatient Medications  Medication Sig Dispense Refill  . aspirin EC 81 MG tablet Take 162 mg by mouth daily.    Marland Kitchen losartan (COZAAR) 50 MG tablet TAKE 1 TABLET BY MOUTH  DAILY 90 tablet 3  . naproxen sodium (ALEVE) 220 MG tablet Take 220 mg by mouth 2 (two) times daily as needed.    Marland Kitchen omeprazole (PRILOSEC) 20 MG capsule TAKE 1 CAPSULE BY MOUTH  ONCE DAILY. 90 capsule 3  . OVER THE COUNTER MEDICATION Take 1 tablet by mouth. Mens multivitamin without iron    . pravastatin (PRAVACHOL)  20 MG tablet TAKE 1 TABLET BY MOUTH  DAILY 90 tablet 3  . PROAIR RESPICLICK [id number] (90 Base) MCG/ACT AEPB Inhale 2 puffs by mouth into the lungs every 4 hours as needed for asthma. 1 each 4   No current facility-administered medications for this visit.    Allergies-reviewed and updated Allergies  Allergen Reactions  . Cortisone Other (See Comments)    Topical cortisone caused swelling    Social History   Social History Narrative   Married 1975. 2 sons. 4 grandkids- all local (21 to 10 in 2018)      [name]   2 years of college.       Hobbies: enjoys watching football with wife [employer], movies, pool in back yard, grandkids.    Objective  Objective:  BP 128/86   Pulse 80   Temp 98 F (36.7 C) (Temporal)   Ht 5\' 11"  (1.803 m)   Wt 231 lb 3.2 oz (104.9 kg)   SpO2 96%   BMI 32.25 kg/m  Gen: NAD, resting comfortably HEENT: Mucous membranes are moist. Oropharynx normal Neck: no thyromegaly CV: RRR no murmurs rubs or gallops Lungs: CTAB no crackles, wheeze, rhonchi Abdomen: soft/nontender/nondistended/normal bowel sounds. No rebound or guarding. Small umbilical hernia Ext: no edema Skin: warm, dry Neuro: grossly normal, moves all extremities, PERRLA    Assessment and Plan  70 y.o. male presenting for annual physical.  Health Maintenance counseling: 1. Anticipatory guidance: Patient counseled regarding regular dental exams -q6 months, eye exams - needs updated yearly,  avoiding smoking and second hand smoke , limiting alcohol to 2 beverages per day - does not drink.   2. Risk factor reduction:  Advised patient of need for regular exercise and diet rich and fruits and vegetables to reduce risk of heart attack and stroke. Exercise- not doing as well lately- encouraged to restart. Diet-holidays were tough- but making adjustments at this point to improve diet and get back on weight loss journey. Prior weight loss has now stablized/up some- 227 on home scales this morning Wt  Readings from Last 3 Encounters:  01/20/21 231 lb 3.2 oz (104.9 kg)  07/20/20 (!) 224 lb (101.6 kg)  06/17/20 226 lb (102.5 kg)  3. Immunizations/screenings/ancillary studies- discussed shingrix . Also he had been exposed likely to omicron and was symptomatic so also likely boosted naturally by that Immunization History  Administered Date(s) Administered  . Fluad Quad(high Dose 65+) 09/15/2020  . Influenza Split 10/30/2013  . Influenza, High Dose Seasonal PF 09/11/2018, 08/26/2019  . Influenza,inj,Quad PF,6+ Mos 08/03/2014, 09/15/2015, 09/13/2016  . Influenza-Unspecified 11/11/2017  . PFIZER(Purple Top)SARS-COV-2 Vaccination 02/13/2020, 03/08/2020, 10/10/2020  . Pneumococcal  Conjugate-13 01/03/2018  . Pneumococcal Polysaccharide-23 01/16/2019  . Pneumococcal-Unspecified 11/22/2006  . Tdap 02/18/2007, 05/16/2017  . Zoster 12/06/2013  4. Prostate cancer screening-  follows with Dr. Jeffie Pollock after radioactive seed implants with Dr. Tresa Moore. PSAs have been going down with urology- 0.95 in September 2021 Lab Results  Component Value Date   PSA 2.76 11/09/2019   PSA 3.47 04/07/2019   PSA 4.16 (H) 01/16/2019   5. Colon cancer screening - 2018 with 5 year repeat with adenomatous polyp 6. Skin cancer screening- no recent dermatology visits. advised regular sunscreen use. Denies worrisome, changing, or new skin lesions.  7. never smoker 8. STD screening - only active with wife  Status of chronic or acute concerns    #Hemochromatosis-follows with [name] S: Ferritin goal under 100.  Has had mild LFT elevation in the past-monitoring as long as remains under 100. Lab Results  Component Value Date   FERRITIN 29 06/15/2020  A/P: will update ferritin level today with labs- goal under 100     #Hypertension S: Controlled on losartan 50 mg. Home diastolic usually around 71-24 depending on timing of medicine.  BP Readings from Last 3 Encounters:  01/20/21 128/86  07/20/20 128/82  06/17/20 (!)  135/92  A/P: Stable. Continue current medications.      #Hyperlipidemia S: Compliant with pravastatin 20 mg. Takes asa 162mg - he may end up stopping this after discussing today Lab Results  Component Value Date   CHOL 135 01/20/2020   HDL 51.80 01/20/2020   LDLCALC 47 01/20/2020   LDLDIRECT 87.0 01/16/2019   TRIG 178.0 (H) 01/20/2020   CHOLHDL 3 01/20/2020  A/P: update lipids- likely continue current meds   -he may stop asa when runs out of current pills   #Hyperglycemia S: CBGs high in the past.  A1c not elevated. Lab Results  Component Value Date   HGBA1C 5.5 01/16/2019  A/P: monitor CBGs since a1c has been normal- If increases may check a1c in future      #Asthma S: No controller.  Sparingly uses albuterol A/P: no recent issues- basically childhood issue     #GERD S: History of Schatzki's ring. Remains on omeprazole follows with Dr. [name]. No issues daytime- worse with laying down- some congestion in throat A/P: slightly worse lately- discussed could work on weight loss- wants to hold off on increasing dose for now- could also follow up with GI    #Joint pain- feet/hands- naproxen helps. Also helps shoulder. Discussed voltaren gel safer alternte  Recommended follow up: Return in about 6 months (around 07/20/2021) for follow up- or sooner if needed. Future Appointments  Date Time Provider [place]  06/14/2021  9:00 AM [facility] None  06/16/2021  9:15 AM [place], Holli Humbles, NP [facility] None   Lab/Order associations: fasting   ICD-10-CM   1. Preventative health care  Z00.00 CBC with Differential/Platelet    Comprehensive metabolic panel    Lipid panel    IBC + Ferritin  2. Essential hypertension  I10 CBC with Differential/Platelet    Comprehensive metabolic panel    Lipid panel  3. Mild intermittent asthma without complication  P80.99   4. Gastroesophageal reflux disease, unspecified whether esophagitis present  K21.9   5. Hyperglycemia   R73.9   6. Hyperlipidemia, unspecified hyperlipidemia type  E78.5 CBC with Differential/Platelet    Comprehensive metabolic panel    Lipid panel  7. 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  Malignant neoplasm of prostate ([place]) Chronic C61     No orders of the defined types were placed in this encounter.   Return precautions advised.  Garret Reddish, MD

## 2021-01-20 ENCOUNTER — Other Ambulatory Visit: Payer: Self-pay

## 2021-01-20 ENCOUNTER — Ambulatory Visit (INDEPENDENT_AMBULATORY_CARE_PROVIDER_SITE_OTHER): Payer: Medicare Other | Admitting: Family Medicine

## 2021-01-20 ENCOUNTER — Encounter: Payer: Self-pay | Admitting: Family Medicine

## 2021-01-20 VITALS — BP 128/86 | HR 80 | Temp 98.0°F | Ht 71.0 in | Wt 231.2 lb

## 2021-01-20 DIAGNOSIS — R739 Hyperglycemia, unspecified: Secondary | ICD-10-CM

## 2021-01-20 DIAGNOSIS — Z0000 Encounter for general adult medical examination without abnormal findings: Secondary | ICD-10-CM

## 2021-01-20 DIAGNOSIS — I10 Essential (primary) hypertension: Secondary | ICD-10-CM

## 2021-01-20 DIAGNOSIS — E785 Hyperlipidemia, unspecified: Secondary | ICD-10-CM

## 2021-01-20 DIAGNOSIS — K219 Gastro-esophageal reflux disease without esophagitis: Secondary | ICD-10-CM | POA: Diagnosis not present

## 2021-01-20 DIAGNOSIS — J4520 Mild intermittent asthma, uncomplicated: Secondary | ICD-10-CM

## 2021-01-20 DIAGNOSIS — C61 Malignant neoplasm of prostate: Secondary | ICD-10-CM

## 2021-01-20 LAB — COMPREHENSIVE METABOLIC PANEL
ALT: 36 U/L (ref 0–53)
AST: 26 U/L (ref 0–37)
Albumin: 4.7 g/dL (ref 3.5–5.2)
Alkaline Phosphatase: 97 U/L (ref 39–117)
BUN: 23 mg/dL (ref 6–23)
CO2: 25 mEq/L (ref 19–32)
Calcium: 9.5 mg/dL (ref 8.4–10.5)
Chloride: 105 mEq/L (ref 96–112)
Creatinine, Ser: 1.05 mg/dL (ref 0.40–1.50)
GFR: 73.14 mL/min (ref >60.00)
Glucose, Bld: 106 mg/dL — ABNORMAL HIGH (ref 70–99)
Potassium: 4.2 mEq/L (ref 3.5–5.1)
Sodium: 139 mEq/L (ref 135–145)
Total Bilirubin: 1.1 mg/dL (ref 0.2–1.2)
Total Protein: 7 g/dL (ref 6.0–8.3)

## 2021-01-20 LAB — CBC WITH DIFFERENTIAL/PLATELET
Basophils Absolute: 0 10*3/uL (ref 0.0–0.1)
Basophils Relative: 0.9 % (ref 0.0–3.0)
Eosinophils Absolute: 0.2 10*3/uL (ref 0.0–0.7)
Eosinophils Relative: 3.2 % (ref 0.0–5.0)
HCT: 47.3 % (ref 39.0–52.0)
Hemoglobin: 16.4 g/dL (ref 13.0–17.0)
Lymphocytes Relative: 20.4 % (ref 12.0–46.0)
Lymphs Abs: 1.1 10*3/uL (ref 0.7–4.0)
MCHC: 34.7 g/dL (ref 30.0–36.0)
MCV: 89.1 fl (ref 78.0–100.0)
Monocytes Absolute: 0.6 10*3/uL (ref 0.1–1.0)
Monocytes Relative: 11 % (ref 3.0–12.0)
Neutro Abs: 3.5 10*3/uL (ref 1.4–7.7)
Neutrophils Relative %: 64.5 % (ref 43.0–77.0)
Platelets: 186 10*3/uL (ref 150.0–400.0)
RBC: 5.31 Mil/uL (ref 4.22–5.81)
RDW: 13.4 % (ref 11.5–15.5)
WBC: 5.5 10*3/uL (ref 4.0–10.5)

## 2021-01-20 LAB — LIPID PANEL
Cholesterol: 156 mg/dL (ref 0–200)
HDL: 50.5 mg/dL (ref >39.00)
LDL Cholesterol: 74 mg/dL (ref 0–99)
NonHDL: 105.54
Total CHOL/HDL Ratio: 3
Triglycerides: 158 mg/dL — ABNORMAL HIGH (ref 0.0–149.0)
VLDL: 31.6 mg/dL (ref 0.0–40.0)

## 2021-01-20 LAB — IBC + FERRITIN
Ferritin: 37.8 ng/mL (ref 22.0–322.0)
Iron: 119 ug/dL (ref 42–165)
Saturation Ratios: 31.1 % (ref 20.0–50.0)
Transferrin: 273 mg/dL (ref 212.0–360.0)

## 2021-03-17 ENCOUNTER — Telehealth: Payer: Self-pay

## 2021-03-17 NOTE — Telephone Encounter (Signed)
S/w pt and r/s his appt as sarh will be out of the office on 6/24   [facility]

## 2021-03-19 IMAGING — DX CHEST - 2 VIEW
2 series · 2 of 2 positions shown · non-contrast
Comparison: None.

CLINICAL DATA: Preop for prostatic brachytherapy seed placement.
History of prostate cancer.

EXAM:
CHEST - 2 VIEW

[chest pa]
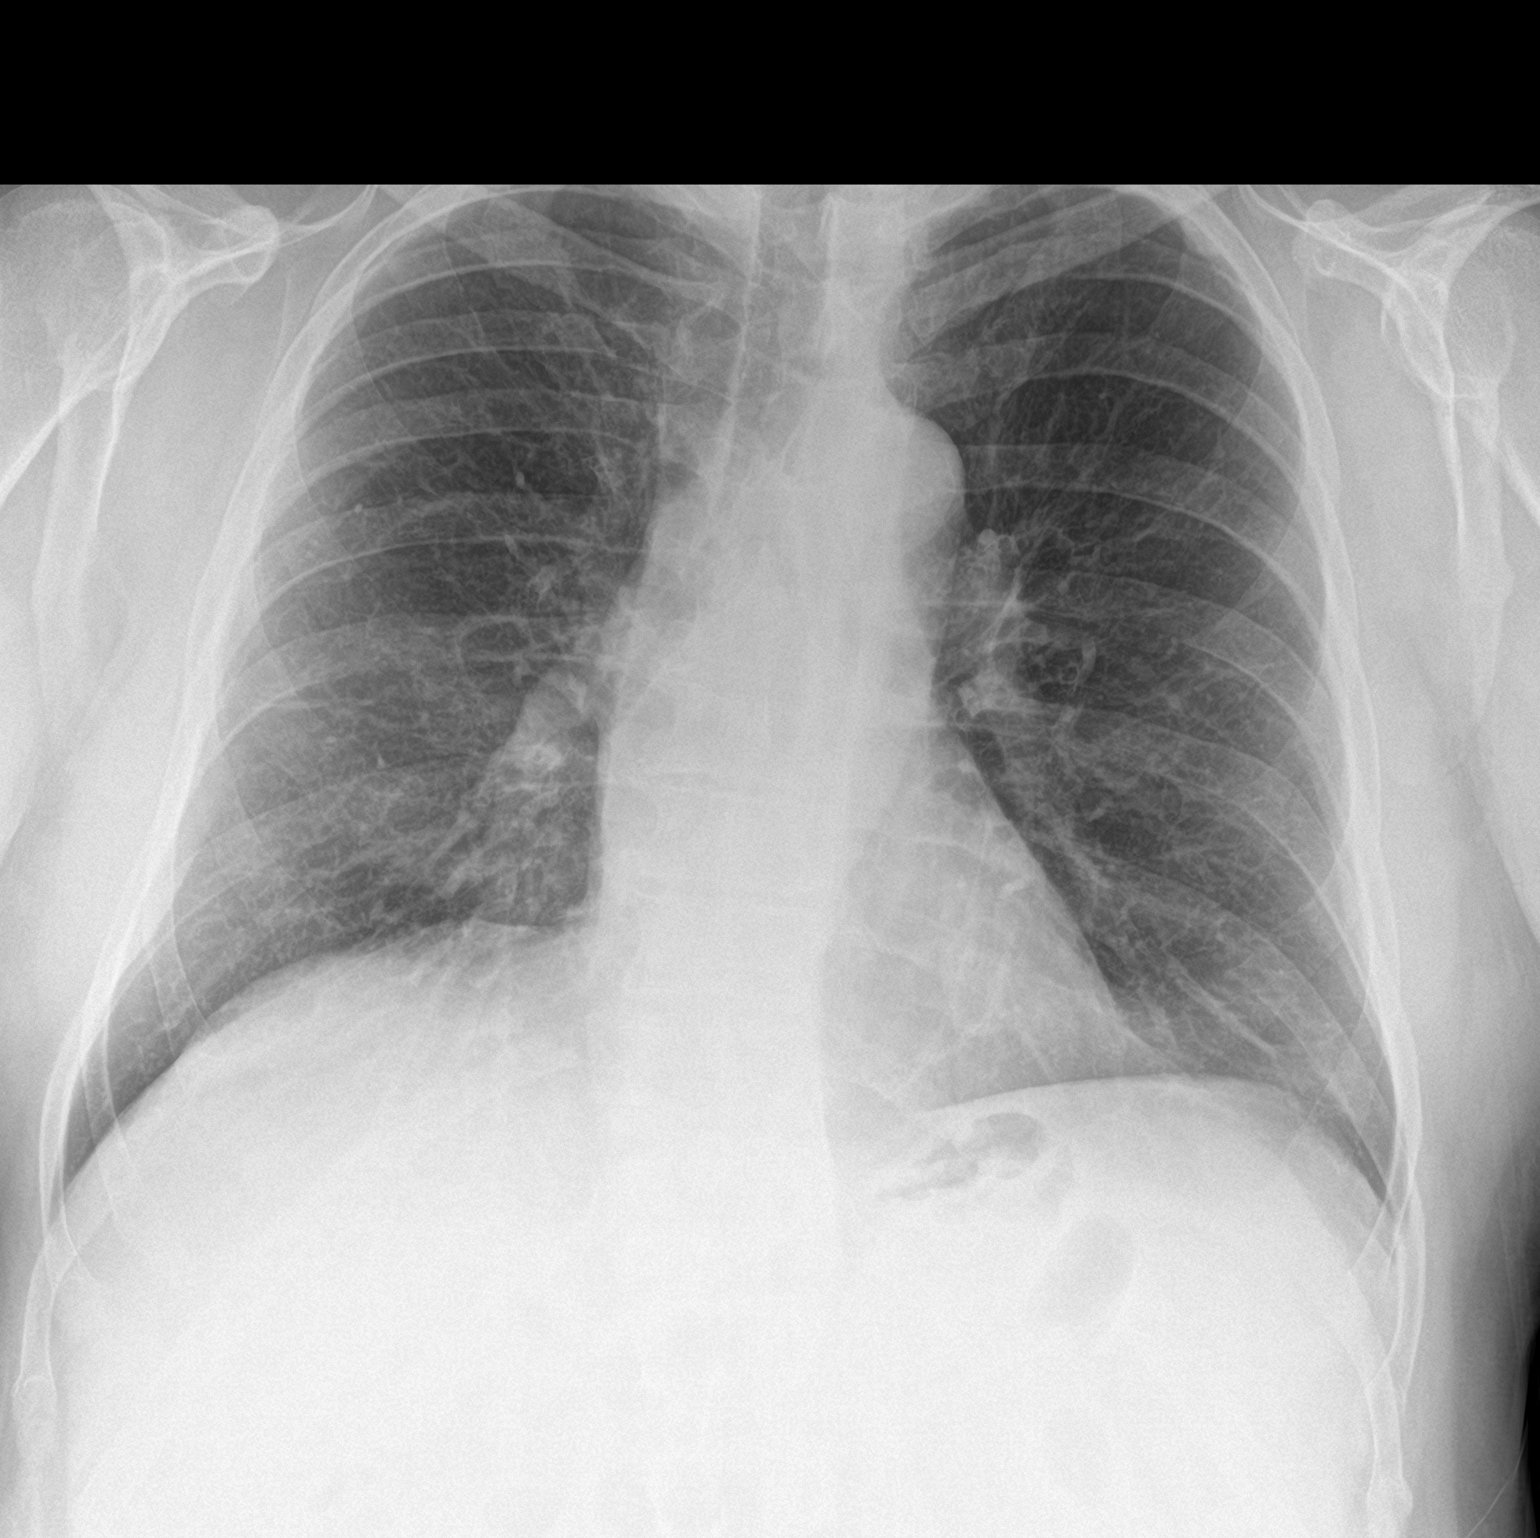

[chest lat]
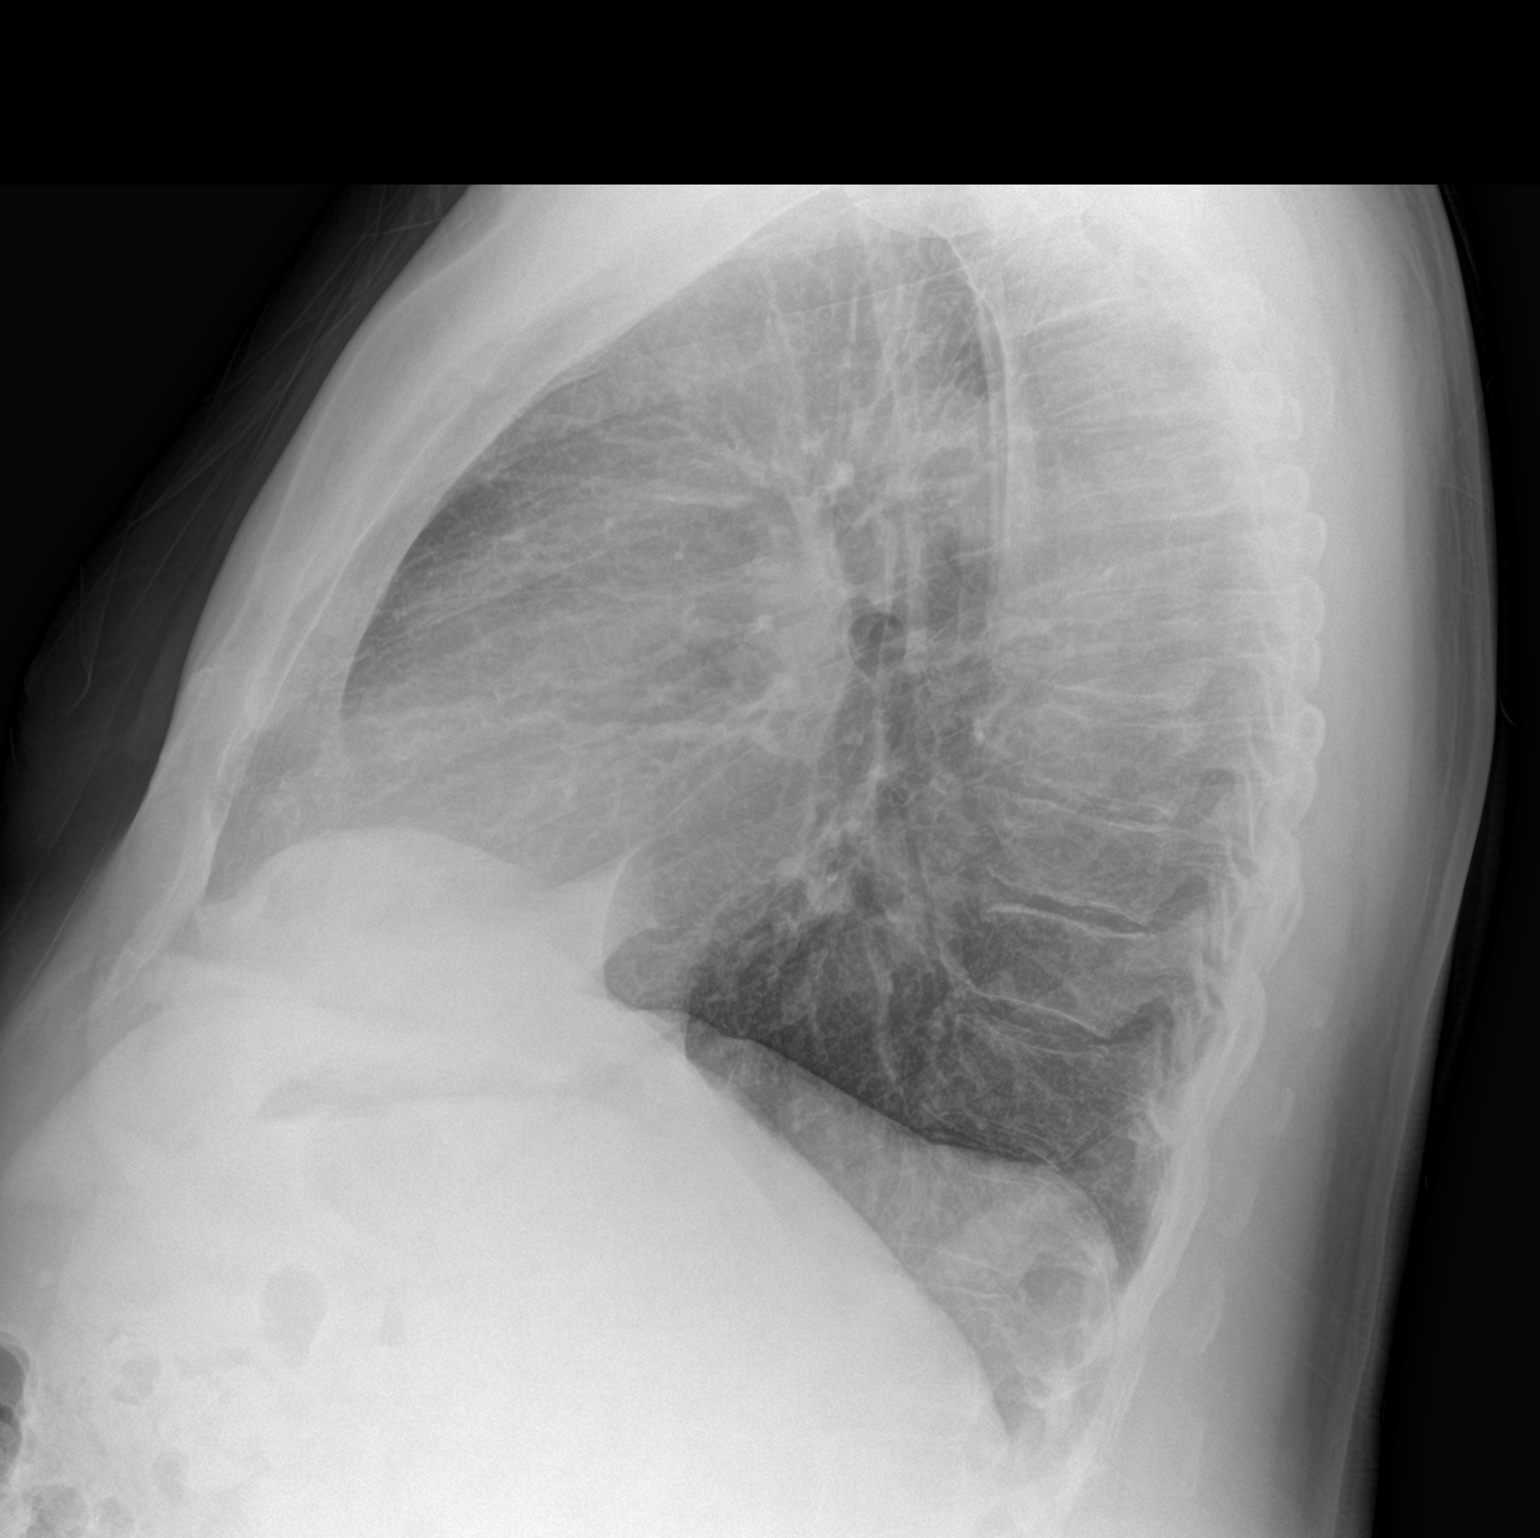

[2 of 2 positions shown; findings below may reference images not displayed]

FINDINGS: The heart size and mediastinal contours are within normal limits.
Both lungs are clear. The visualized skeletal structures are
unremarkable.
IMPRESSION: No active cardiopulmonary disease.

## 2021-03-22 DIAGNOSIS — R3912 Poor urinary stream: Secondary | ICD-10-CM | POA: Diagnosis not present

## 2021-04-10 ENCOUNTER — Ambulatory Visit (INDEPENDENT_AMBULATORY_CARE_PROVIDER_SITE_OTHER): Payer: Medicare Other

## 2021-04-10 DIAGNOSIS — Z0000 Encounter for general adult medical examination without abnormal findings: Secondary | ICD-10-CM

## 2021-04-10 NOTE — Progress Notes (Addendum)
Virtual Visit via Telephone Note  I connected with  Randall Turner. on 04/10/21 at  9:30 AM EDT by telephone and verified that I am speaking with the correct person using two identifiers.  Medicare Annual Wellness visit completed telephonically due to Covid-19 pandemic.   Persons participating in this call: This Health Coach and this patient.   Location: Patient: Home Provider: Office   I discussed the limitations, risks, security and privacy concerns of performing an evaluation and management service by telephone and the availability of in person appointments. The patient expressed understanding and agreed to proceed.  Unable to perform video visit due to video visit attempted and failed and/or patient does not have video capability.   Some vital signs may be absent or patient reported.   Willette Brace, LPN    Subjective:   Randall Turner. is a 70 y.o. male who presents for Medicare Annual/Subsequent preventive examination.  Review of Systems     Cardiac Risk Factors include: advanced age (>49men, >35 women);hypertension;dyslipidemia;male gender;obesity (BMI >30kg/m2)     Objective:    There were no vitals filed for this visit. There is no height or weight on file to calculate BMI.  Advanced Directives 04/10/2021 06/17/2020 01/28/2020 09/03/2019 08/11/2019 06/19/2019 05/05/2019  Does Patient Have a Medical Advance Directive? Yes No No No No No No  Does patient want to make changes to medical advance directive? Yes (MAU/Ambulatory/Procedural Areas - Information given) - - - - - -  Would patient like information on creating a medical advance directive? - No - Patient declined Yes (MAU/Ambulatory/Procedural Areas - Information given) - No - Patient declined No - Patient declined No - Patient declined    Current Medications (verified) Outpatient Encounter Medications as of 04/10/2021  Medication Sig  . aspirin EC 81 MG tablet Take 162 mg by mouth daily.  Marland Kitchen losartan  (COZAAR) 50 MG tablet TAKE 1 TABLET BY MOUTH  DAILY  . naproxen sodium (ALEVE) 220 MG tablet Take 220 mg by mouth 2 (two) times daily as needed.  . Omega-3 Fatty Acids (OMEGA-3 FISH OIL PO) Take by mouth.  Marland Kitchen omeprazole (PRILOSEC) 20 MG capsule TAKE 1 CAPSULE BY MOUTH  ONCE DAILY.  . pravastatin (PRAVACHOL) 20 MG tablet TAKE 1 TABLET BY MOUTH  DAILY  . PROAIR RESPICLICK 397 (90 Base) MCG/ACT AEPB Inhale 2 puffs by mouth into the lungs every 4 hours as needed for asthma.  Marland Kitchen OVER THE COUNTER MEDICATION Take 1 tablet by mouth. Mens multivitamin without iron   No facility-administered encounter medications on file as of 04/10/2021.    Allergies (verified) Cortisone   History: Past Medical History:  Diagnosis Date  . Arthritis   . Asthma    followed by pcp  . Family history of adverse reaction to anesthesia    per pt his son age 44 w/ hand surgery done approx late 1980s/early 1990s,  post op amnesia;  son has had surgery since with no issues  . Fatty liver    followed by pcp-- per pt told by pcp blood work better  . GERD (gastroesophageal reflux disease)   . 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 Hiatal hernia   . History of adenomatous polyp of colon   . History of kidney stones   . Hyperlipidemia   . Hypertension   . Internal hemorrhoids   .  Prostate cancer [facility]) urologist-- dr wrenn/ oncologist-- dr Tammi Klippel   dx 04-13-2019--- Stage T1c, Gleason 3+3  . Wears glasses    Past Surgical History:  Procedure Laterality Date  . BUNIONECTOMY Bilateral 2005  . COLONOSCOPY  last one 2018  . CYSTOSCOPY N/A 08/11/2019   Procedure: CYSTOSCOPY;  Surgeon: Irine Seal, MD;  Location: [facility];  Service: Urology;  Laterality: N/A;  . PATELLECTOMY Right 1969   football injury   . RADIOACTIVE SEED IMPLANT N/A 08/11/2019   Procedure:  RADIOACTIVE SEED IMPLANT/BRACHYTHERAPY IMPLANT;  Surgeon: Irine Seal, MD;  Location: [facility];  Service: Urology;  Laterality: N/A;  . SPACE OAR INSTILLATION N/A 08/11/2019   Procedure: SPACE OAR INSTILLATION;  Surgeon: Irine Seal, MD;  Location: [facility];  Service: Urology;  Laterality: N/A;  . TONSILLECTOMY  1958   Family History  Problem Relation Age of Onset  . Gallstones Mother   . Colon cancer Mother   . Lung cancer Mother        discovered near death  . Heart disease Mother        rhythm and mitral valve prolapse  . Alzheimer's disease Father   . Prostate cancer Father        radiation when younger- survived  . Mesothelioma Father   . Heart disease Maternal Grandmother        died at young age from heart issue unkown  . Cancer Maternal Grandfather   . Heart disease Paternal Grandmother        enlarged heart  . Heart attack Paternal Grandfather        age 69  . Nephrolithiasis Sister   . Diabetes Maternal Aunt   . Pancreatic cancer Neg Hx   . Stomach cancer Neg Hx    Social History   Socioeconomic History  . Marital status: Married    Spouse name: Not on file  . Number of children: 2  . Years of education: Not on file  . Highest education level: Not on file  Occupational History    Comment: retired since 2016  Tobacco Use  . Smoking status: Never Smoker  . Smokeless tobacco: Never Used  Vaping Use  . Vaping Use: Never used  Substance and Sexual Activity  . Alcohol use: No    Alcohol/week: 0.0 standard drinks  . Drug use: Never  . Sexual activity: Not on file    Comment: vasectomy  Other Topics Concern  . Not on file  Social History Narrative   Married 1975. 2 sons. 4 grandkids- all local (21 to 10 in 2018)      [name]   2 years of college.       Hobbies: enjoys watching football with wife [employer], movies, pool in back yard, grandkids.    Social Determinants of Health   Financial Resource Strain: Low Risk    . Difficulty of Paying Living Expenses: Not hard at all  Food Insecurity: No Food Insecurity  . Worried About Charity fundraiser in the Last Year: Never true  . Ran Out of Food in the Last Year: Never true  Transportation Needs: No Transportation Needs  . Lack of Transportation (Medical): No  . Lack of Transportation (Non-Medical): No  Physical Activity: Inactive  . Days of Exercise per Week: 0 days  . Minutes of Exercise per Session: 0 min  Stress: No Stress Concern Present  . Feeling of Stress : Not at all  Social Connections: Moderately Isolated  .  Frequency of Communication with Friends and Family: Once a week  . Frequency of Social Gatherings with Friends and Family: Once a week  . Attends Religious Services: 1 to 4 times per year  . Active Member of Clubs or Organizations: No  . Attends Archivist Meetings: Never  . Marital Status: Married    Tobacco Counseling Counseling given: Not Answered   Clinical Intake:  Pre-visit preparation completed: Yes  Pain : No/denies pain     BMI - recorded: 32.1 Nutritional Status: BMI > 30  Obese Nutritional Risks: None Diabetes: No  How often do you need to have someone help you when you read instructions, pamphlets, or other written materials from your doctor or pharmacy?: 1 - Never  Diabetic?No  Interpreter Needed?: No  Information entered by :: Charlott Rakes LPN   Activities of Daily Living In your present state of health, do you have any difficulty performing the following activities: 04/10/2021  Hearing? Y  Vision? N  Difficulty concentrating or making decisions? N  Walking or climbing stairs? N  Dressing or bathing? N  Doing errands, shopping? N  Preparing Food and eating ? N  Using the Toilet? N  In the past six months, have you accidently leaked urine? N  Do you have problems with loss of bowel control? N  Managing your Medications? N  Managing your Finances? N  Housekeeping or managing your  Housekeeping? N  Some recent data might be hidden    Patient Care Team: Marin Olp, MD as PCP - General (Family Medicine) Cira Rue, RN Nurse Navigator as Registered Nurse (Medical Oncology) Juluis Rainier as Consulting Physician (Optometry) Marin Olp, Rudell Cobb, MD as Consulting Physician (Oncology) Pyrtle, Lajuan Lines, MD as Consulting Physician (Gastroenterology) Irine Seal, MD as Consulting Physician (Urology)  Indicate any recent Medical Services you may have received from other than [facility] providers in the past year (date may be approximate).     Assessment:   This is a routine wellness examination for Nayib.  Hearing/Vision screen  Hearing Screening   125Hz  250Hz  500Hz  1000Hz  2000Hz  3000Hz  4000Hz  6000Hz  8000Hz   Right ear:           Left ear:           Comments: Pt stated right ear hearing is greater loss than left   Vision Screening Comments: Pt follows Dr Idolina Primer for eye exams pt states will make an appt   Dietary issues and exercise activities discussed: Current Exercise Habits: The patient does not participate in regular exercise at present  Goals    . Patient Stated     Lose 10lbs       Depression Screen PHQ 2/9 Scores 04/10/2021 01/20/2021 01/28/2020 01/20/2020 01/16/2019 07/04/2018 05/16/2017  PHQ - 2 Score 0 0 0 0 0 0 0    Fall Risk Fall Risk  04/10/2021 01/20/2021 01/28/2020 07/17/2019 07/04/2018  Falls in the past year? 0 0 0 0 No  Number falls in past yr: 0 0 0 0 -  Injury with Fall? 0 0 0 0 -  Risk for fall due to : Impaired vision - - - -  Follow up Falls prevention discussed - Falls evaluation completed;Education provided;Falls prevention discussed - -    FALL RISK PREVENTION PERTAINING TO THE HOME:  Any stairs in or around the home? No  If so, are there any without handrails? No  Home free of loose throw rugs in walkways, pet beds, electrical cords, etc? Yes  Adequate lighting  in your home to reduce risk of falls? Yes   ASSISTIVE DEVICES UTILIZED TO PREVENT  FALLS:  Life alert? No  Use of a cane, walker or w/c? No  Grab bars in the bathroom? No  Shower chair or bench in shower? No  Elevated toilet seat or a handicapped toilet? No   TIMED UP AND GO:  Was the test performed? No     Cognitive Function:     6CIT Screen 04/10/2021  What Year? 0 points  What month? 3 points  Count back from 20 0 points  Months in reverse 0 points  Repeat phrase 0 points    Immunizations Immunization History  Administered Date(s) Administered  . Fluad Quad(high Dose 65+) 09/15/2020  . Influenza Split 10/30/2013  . Influenza, High Dose Seasonal PF 09/11/2018, 08/26/2019  . Influenza,inj,Quad PF,6+ Mos 08/03/2014, 09/15/2015, 09/13/2016  . Influenza-Unspecified 11/11/2017  . PFIZER(Purple Top)SARS-COV-2 Vaccination 02/13/2020, 03/08/2020, 10/11/2020  . Pneumococcal Conjugate-13 01/03/2018  . Pneumococcal Polysaccharide-23 01/16/2019  . Pneumococcal-Unspecified 11/22/2006  . Tdap 02/18/2007, 05/16/2017  . Zoster 12/06/2013    TDAP status: Up to date  Flu Vaccine status: Up to date  Pneumococcal vaccine status: Up to date  Covid-19 vaccine status: Completed vaccines  Qualifies for Shingles Vaccine? Yes   Zostavax completed Yes   Shingrix Completed?: No.    Education has been provided regarding the importance of this vaccine. Patient has been advised to call insurance company to determine out of pocket expense if they have not yet received this vaccine. Advised may also receive vaccine at local pharmacy or Health Dept. Verbalized acceptance and understanding.  Screening Tests Health Maintenance  Topic Date Due  . COVID-19 Vaccine (4 - Booster for Pfizer series) 04/11/2021  . INFLUENZA VACCINE  07/24/2021  . COLONOSCOPY (Pts 45-28yrs Insurance coverage will need to be confirmed)  01/23/2022  . TETANUS/TDAP  05/17/2027  . Hepatitis C Screening  Completed  . PNA vac Low Risk Adult  Completed  . HPV VACCINES  Aged Out    Health  Maintenance  Health Maintenance Due  Topic Date Due  . COVID-19 Vaccine (4 - Booster for Pfizer series) 04/11/2021    Colorectal cancer screening: Type of screening: Colonoscopy. Completed 01/23/17. Repeat every 5 years   Additional Screening:  Hepatitis C Screening:  Completed 08/03/14  Vision Screening: Recommended annual ophthalmology exams for early detection of glaucoma and other disorders of the eye. Is the patient up to date with their annual eye exam?  No  Who is the provider or what is the name of the office in which the patient attends annual eye exams? Dr Alois Cliche If pt is not established with a provider, would they like to be referred to a provider to establish care? No .   Dental Screening: Recommended annual dental exams for proper oral hygiene  Community Resource Referral / Chronic Care Management: CRR required this visit?  No   CCM required this visit?  No      Plan:     I have personally reviewed and noted the following in the patient's chart:   . Medical and social history . Use of alcohol, tobacco or illicit drugs  . Current medications and supplements . Functional ability and status . Nutritional status . Physical activity . Advanced directives . List of other physicians . Hospitalizations, surgeries, and ER visits in previous 12 months . Vitals . Screenings to include cognitive, depression, and falls . Referrals and appointments  In addition, I have reviewed and  discussed with patient certain preventive protocols, quality metrics, and best practice recommendations. A written personalized care plan for preventive services as well as general preventive health recommendations were provided to patient.     Willette Brace, LPN   [id number]   Nurse Notes: None

## 2021-04-10 NOTE — Patient Instructions (Addendum)
Randall Turner , Thank you for taking time to come for your Medicare Wellness Visit. I appreciate your ongoing commitment to your health goals. Please review the following plan we discussed and let me know if I can assist you in the future.   Screening recommendations/referrals: Colonoscopy: Done 01/23/17 Recommended yearly ophthalmology/optometry visit for glaucoma screening and checkup Recommended yearly dental visit for hygiene and checkup  Vaccinations: Influenza vaccine: Up to date Pneumococcal vaccine: Up to date Tdap vaccine: Up to date Shingles vaccine: Shingrix discussed. Please contact your pharmacy for coverage information.     Covid-19: Completed 2/20, 3/16, & 10/10/20  Advanced directives: Advance directive discussed with you today. I have provided a copy for you to complete at home and have notarized. Once this is complete please bring a copy in to our office so we can scan it into your chart.  Conditions/risks identified: Lose 10lbs   Next appointment: Follow up in one year for your annual wellness visit.   Preventive Care 40 Years and Older, Male Preventive care refers to lifestyle choices and visits with your health care provider that can promote health and wellness. What does preventive care include?  A yearly physical exam. This is also called an annual well check.  Dental exams once or twice a year.  Routine eye exams. Ask your health care provider how often you should have your eyes checked.  Personal lifestyle choices, including:  Daily care of your teeth and gums.  Regular physical activity.  Eating a healthy diet.  Avoiding tobacco and drug use.  Limiting alcohol use.  Practicing safe sex.  Taking low doses of aspirin every day.  Taking vitamin and mineral supplements as recommended by your health care provider. What happens during an annual well check? The services and screenings done by your health care provider during your annual well check  will depend on your age, overall health, lifestyle risk factors, and family history of disease. Counseling  Your health care provider may ask you questions about your:  Alcohol use.  Tobacco use.  Drug use.  Emotional well-being.  Home and relationship well-being.  Sexual activity.  Eating habits.  History of falls.  Memory and ability to understand (cognition).  Work and work Statistician. Screening  You may have the following tests or measurements:  Height, weight, and BMI.  Blood pressure.  Lipid and cholesterol levels. These may be checked every 5 years, or more frequently if you are over 68 years old.  Skin check.  Lung cancer screening. You may have this screening every year starting at age 1 if you have a 30-pack-year history of smoking and currently smoke or have quit within the past 15 years.  Fecal occult blood test (FOBT) of the stool. You may have this test every year starting at age 25.  Flexible sigmoidoscopy or colonoscopy. You may have a sigmoidoscopy every 5 years or a colonoscopy every 10 years starting at age 74.  Prostate cancer screening. Recommendations will vary depending on your family history and other risks.  Hepatitis C blood test.  Hepatitis B blood test.  Sexually transmitted disease (STD) testing.  Diabetes screening. This is done by checking your blood sugar (glucose) after you have not eaten for a while (fasting). You may have this done every 1-3 years.  Abdominal aortic aneurysm (AAA) screening. You may need this if you are a current or former smoker.  Osteoporosis. You may be screened starting at age 77 if you are at high risk. Talk with  your health care provider about your test results, treatment options, and if necessary, the need for more tests. Vaccines  Your health care provider may recommend certain vaccines, such as:  Influenza vaccine. This is recommended every year.  Tetanus, diphtheria, and acellular pertussis  (Tdap, Td) vaccine. You may need a Td booster every 10 years.  Zoster vaccine. You may need this after age 52.  Pneumococcal 13-valent conjugate (PCV13) vaccine. One dose is recommended after age 72.  Pneumococcal polysaccharide (PPSV23) vaccine. One dose is recommended after age 30. Talk to your health care provider about which screenings and vaccines you need and how often you need them. This information is not intended to replace advice given to you by your health care provider. Make sure you discuss any questions you have with your health care provider. Document Released: 01/06/2016 Document Revised: 08/29/2016 Document Reviewed: 10/11/2015 Elsevier Interactive Patient Education  2017 [place] Prevention in the Home Falls can cause injuries. They can happen to people of all ages. There are many things you can do to make your home safe and to help prevent falls. What can I do on the outside of my home?  Regularly fix the edges of walkways and driveways and fix any cracks.  Remove anything that might make you trip as you walk through a door, such as a raised step or threshold.  Trim any bushes or trees on the path to your home.  Use bright outdoor lighting.  Clear any walking paths of anything that might make someone trip, such as rocks or tools.  Regularly check to see if handrails are loose or broken. Make sure that both sides of any steps have handrails.  Any raised decks and porches should have guardrails on the edges.  Have any leaves, snow, or ice cleared regularly.  Use sand or salt on walking paths during winter.  Clean up any spills in your garage right away. This includes oil or grease spills. What can I do in the bathroom?  Use night lights.  Install grab bars by the toilet and in the tub and shower. Do not use towel bars as grab bars.  Use non-skid mats or decals in the tub or shower.  If you need to sit down in the shower, use a plastic, non-slip  stool.  Keep the floor dry. Clean up any water that spills on the floor as soon as it happens.  Remove soap buildup in the tub or shower regularly.  Attach bath mats securely with double-sided non-slip rug tape.  Do not have throw rugs and other things on the floor that can make you trip. What can I do in the bedroom?  Use night lights.  Make sure that you have a light by your bed that is easy to reach.  Do not use any sheets or blankets that are too big for your bed. They should not hang down onto the floor.  Have a firm chair that has side arms. You can use this for support while you get dressed.  Do not have throw rugs and other things on the floor that can make you trip. What can I do in the kitchen?  Clean up any spills right away.  Avoid walking on wet floors.  Keep items that you use a lot in easy-to-reach places.  If you need to reach something above you, use a strong step stool that has a grab bar.  Keep electrical cords out of the way.  Do not  use floor polish or wax that makes floors slippery. If you must use wax, use non-skid floor wax.  Do not have throw rugs and other things on the floor that can make you trip. What can I do with my stairs?  Do not leave any items on the stairs.  Make sure that there are handrails on both sides of the stairs and use them. Fix handrails that are broken or loose. Make sure that handrails are as long as the stairways.  Check any carpeting to make sure that it is firmly attached to the stairs. Fix any carpet that is loose or worn.  Avoid having throw rugs at the top or bottom of the stairs. If you do have throw rugs, attach them to the floor with carpet tape.  Make sure that you have a light switch at the top of the stairs and the bottom of the stairs. If you do not have them, ask someone to add them for you. What else can I do to help prevent falls?  Wear shoes that:  Do not have high heels.  Have rubber bottoms.  Are  comfortable and fit you well.  Are closed at the toe. Do not wear sandals.  If you use a stepladder:  Make sure that it is fully opened. Do not climb a closed stepladder.  Make sure that both sides of the stepladder are locked into place.  Ask someone to hold it for you, if possible.  Clearly mark and make sure that you can see:  Any grab bars or handrails.  First and last steps.  Where the edge of each step is.  Use tools that help you move around (mobility aids) if they are needed. These include:  Canes.  Walkers.  Scooters.  Crutches.  Turn on the lights when you go into a dark area. Replace any light bulbs as soon as they burn out.  Set up your furniture so you have a clear path. Avoid moving your furniture around.  If any of your floors are uneven, fix them.  If there are any pets around you, be aware of where they are.  Review your medicines with your doctor. Some medicines can make you feel dizzy. This can increase your chance of falling. Ask your doctor what other things that you can do to help prevent falls. This information is not intended to replace advice given to you by your health care provider. Make sure you discuss any questions you have with your health care provider. Document Released: 10/06/2009 Document Revised: 05/17/2016 Document Reviewed: 01/14/2015 Elsevier Interactive Patient Education  2017 [employer].

## 2021-06-01 ENCOUNTER — Telehealth: Payer: Self-pay | Admitting: Family Medicine

## 2021-06-01 NOTE — Chronic Care Management (AMB) (Signed)
  Chronic Care Management   Note  06/01/2021 Name: Randall Turner. MRN: [id number] DOB: 1951/10/04  Randall Turner. is a 70 y.o. year old male who is a primary care patient of Marin Olp, MD. I reached out to Randall Turner. by phone today in response to a referral sent by [name]'s PCP, Yong Channel Brayton Mars, MD.   [name] was given information about Chronic Care Management services today including:  CCM service includes personalized support from designated clinical staff supervised by his physician, including individualized plan of care and coordination with other care providers 24/7 contact phone numbers for assistance for urgent and routine care needs. Service will only be billed when office clinical staff spend 20 minutes or more in a month to coordinate care. Only one practitioner may furnish and bill the service in a calendar month. The patient may stop CCM services at any time (effective at the end of the month) by phone call to the office staff.   Patient agreed to services and verbal consent obtained.   Follow up plan:   Lauretta Grill Upstream Scheduler

## 2021-06-01 NOTE — Progress Notes (Signed)
  Chronic Care Management   Outreach Note  06/01/2021 Name: Randall Turner. MRN: [id number] DOB: 1951/04/23  Referred by: Marin Olp, MD Reason for referral : No chief complaint on file.   An unsuccessful telephone outreach was attempted today. The patient was referred to the pharmacist for assistance with care management and care coordination.   Follow Up Plan:   Lauretta Grill Upstream Scheduler

## 2021-06-14 ENCOUNTER — Other Ambulatory Visit: Payer: Self-pay | Admitting: Family Medicine

## 2021-06-14 ENCOUNTER — Other Ambulatory Visit: Payer: Self-pay

## 2021-06-14 ENCOUNTER — Encounter: Payer: Self-pay | Admitting: *Deleted

## 2021-06-14 ENCOUNTER — Inpatient Hospital Stay: Payer: Medicare Other | Attending: Hematology & Oncology

## 2021-06-14 DIAGNOSIS — Z7982 Long term (current) use of aspirin: Secondary | ICD-10-CM | POA: Diagnosis not present

## 2021-06-14 DIAGNOSIS — Z8546 Personal history of malignant neoplasm of prostate: Secondary | ICD-10-CM | POA: Diagnosis not present

## 2021-06-14 DIAGNOSIS — Z79899 Other long term (current) drug therapy: Secondary | ICD-10-CM | POA: Insufficient documentation

## 2021-06-14 DIAGNOSIS — K219 Gastro-esophageal reflux disease without esophagitis: Secondary | ICD-10-CM

## 2021-06-14 LAB — CBC WITH DIFFERENTIAL (CANCER CENTER ONLY)
Abs Immature Granulocytes: 0.01 10*3/uL (ref 0.00–0.07)
Basophils Absolute: 0.1 10*3/uL (ref 0.0–0.1)
Basophils Relative: 1 %
Eosinophils Absolute: 0.4 10*3/uL (ref 0.0–0.5)
Eosinophils Relative: 7 %
HCT: 44.4 % (ref 39.0–52.0)
Hemoglobin: 15.6 g/dL (ref 13.0–17.0)
Immature Granulocytes: 0 %
Lymphocytes Relative: 26 %
Lymphs Abs: 1.3 10*3/uL (ref 0.7–4.0)
MCH: 31.3 pg (ref 26.0–34.0)
MCHC: 35.1 g/dL (ref 30.0–36.0)
MCV: 89 fL (ref 80.0–100.0)
Monocytes Absolute: 0.5 10*3/uL (ref 0.1–1.0)
Monocytes Relative: 10 %
Neutro Abs: 2.9 10*3/uL (ref 1.7–7.7)
Neutrophils Relative %: 56 %
Platelet Count: 188 10*3/uL (ref 150–400)
RBC: 4.99 MIL/uL (ref 4.22–5.81)
RDW: 13 % (ref 11.5–15.5)
WBC Count: 5.1 10*3/uL (ref 4.0–10.5)
nRBC: 0 % (ref 0.0–0.2)

## 2021-06-14 LAB — CMP (CANCER CENTER ONLY)
ALT: 37 U/L (ref 0–44)
AST: 23 U/L (ref 15–41)
Albumin: 4 g/dL (ref 3.5–5.0)
Alkaline Phosphatase: 86 U/L (ref 38–126)
Anion gap: 7 (ref 5–15)
BUN: 17 mg/dL (ref 8–23)
CO2: 25 mmol/L (ref 22–32)
Calcium: 9.1 mg/dL (ref 8.9–10.3)
Chloride: 108 mmol/L (ref 98–111)
Creatinine: 1.07 mg/dL (ref 0.61–1.24)
GFR, Estimated: >60 mL/min (ref >60)
Glucose, Bld: 105 mg/dL — ABNORMAL HIGH (ref 70–99)
Potassium: 4.5 mmol/L (ref 3.5–5.1)
Sodium: 140 mmol/L (ref 135–145)
Total Bilirubin: 1.1 mg/dL (ref 0.3–1.2)
Total Protein: 6.4 g/dL — ABNORMAL LOW (ref 6.5–8.1)

## 2021-06-14 LAB — IRON AND TIBC
Iron: 131 ug/dL (ref 42–163)
Saturation Ratios: 40 % (ref 20–55)
TIBC: 324 ug/dL (ref 202–409)
UIBC: 193 ug/dL (ref 117–376)

## 2021-06-14 LAB — FERRITIN: Ferritin: 31 ng/mL (ref 24–336)

## 2021-06-16 ENCOUNTER — Ambulatory Visit: Payer: Medicare Other | Admitting: Family

## 2021-06-20 ENCOUNTER — Other Ambulatory Visit: Payer: Self-pay

## 2021-06-20 ENCOUNTER — Telehealth: Payer: Self-pay

## 2021-06-20 ENCOUNTER — Encounter: Payer: Self-pay | Admitting: Family

## 2021-06-20 ENCOUNTER — Other Ambulatory Visit: Payer: Self-pay | Admitting: Family Medicine

## 2021-06-20 ENCOUNTER — Inpatient Hospital Stay: Payer: Medicare Other | Admitting: Family

## 2021-06-20 DIAGNOSIS — Z79899 Other long term (current) drug therapy: Secondary | ICD-10-CM | POA: Diagnosis not present

## 2021-06-20 DIAGNOSIS — Z7982 Long term (current) use of aspirin: Secondary | ICD-10-CM | POA: Diagnosis not present

## 2021-06-20 NOTE — Telephone Encounter (Signed)
Per 06/20/21 los  Follow-up as needed.    Randall Turner

## 2021-06-20 NOTE — Progress Notes (Signed)
Hematology and Oncology Follow Up Visit  Randall Turner [id number] July 04, 1951 70 y.o. 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   Interim History:  Randall Turner is here today for follow-up. He is doing well and has no complaints at this time.  His iron saturation was 40% and ferritin 31. He has not required a phlebotomy in several years.  No blood loss noted. No bruising or petechiae.  No fever, chills, n/v, cough, rash, dizziness, SOB, chest pain, palpitations, abdominal pain or changes in bowel or bladder habits.  No swelling, tenderness, numbness or tingling in his extremities at this time.  No falls or syncope to report.  He has maintained a good appetite and is staying well hydrated. His weight is stable at 238 lbs.    ECOG Performance Status: 1 - Symptomatic but completely ambulatory  Medications:  Allergies as of 06/20/2021       Reactions   Cortisone Other (See Comments)   Topical cortisone caused swelling        Medication List        Accurate as of June 20, 2021 12:25 PM. If you have any questions, ask your nurse or doctor.          aspirin EC 81 MG tablet Take 162 mg by mouth daily.   losartan 50 MG tablet Commonly known as: COZAAR TAKE 1 TABLET BY MOUTH  DAILY   naproxen sodium 220 MG tablet Commonly known as: ALEVE Take 220 mg by mouth 2 (two) times daily as needed.   OMEGA-3 FISH OIL PO Take by mouth.   omeprazole 20 MG capsule Commonly known as: PRILOSEC TAKE 1 CAPSULE BY MOUTH  ONCE DAILY   OVER THE COUNTER MEDICATION Take 1 tablet by mouth. Mens multivitamin without iron   pravastatin 20 MG tablet Commonly known as: PRAVACHOL TAKE 1 TABLET BY MOUTH  DAILY   ProAir RespiClick 811 (90 Base) MCG/ACT Aepb Generic drug:  Albuterol Sulfate Inhale 2 puffs by mouth into the lungs every 4 hours as needed for asthma.        Allergies:  Allergies  Allergen Reactions   Cortisone Other (See Comments)    Topical cortisone caused swelling    Past Medical History, Surgical history, Social history, and Family History were reviewed and updated.  Review of Systems: All other 10 point review of systems is negative.   Physical Exam:  height is 5\' 11"  (1.803 m) and weight is 238 lb 1.9 oz (108 kg). His oral temperature is 98.7 F (37.1 C). His blood pressure is 139/79 and his pulse is 90. His respiration is 17 and oxygen saturation is 96%.   Wt Readings from Last 3 Encounters:  06/20/21 238 lb 1.9 oz (108 kg)  01/20/21 231 lb 3.2 oz (104.9 kg)  07/20/20 (!) 224 lb (101.6 kg)    Ocular: Sclerae unicteric, pupils equal, round and reactive to light Ear-nose-throat: Oropharynx clear, dentition fair Lymphatic: No cervical or supraclavicular adenopathy Lungs no rales or rhonchi, good excursion bilaterally Heart regular rate and rhythm, no murmur appreciated Abd soft, nontender, positive bowel sounds MSK no focal spinal tenderness, no joint edema Neuro: non-focal, well-oriented, appropriate affect Breasts: Deferred   Lab Results  Component Value Date   WBC 5.1 06/14/2021  HGB 15.6 06/14/2021   HCT 44.4 06/14/2021   MCV 89.0 06/14/2021   PLT 188 06/14/2021   Lab Results  Component Value Date   FERRITIN 31 06/14/2021   IRON 131 06/14/2021   TIBC 324 06/14/2021   UIBC 193 06/14/2021   IRONPCTSAT 40 06/14/2021   Lab Results  Component Value Date   RBC 4.99 06/14/2021   No results found for: KPAFRELGTCHN, LAMBDASER, KAPLAMBRATIO No results found for: IGGSERUM, IGA, IGMSERUM No results found for: Odetta Pink, SPEI   Chemistry      Component Value Date/Time   NA 140 06/14/2021 0906   NA 141 06/11/2017 1401   K 4.5 06/14/2021 0906   K 4.3  06/11/2017 1401   CL 108 06/14/2021 0906   CO2 25 06/14/2021 0906   CO2 25 06/11/2017 1401   BUN 17 06/14/2021 0906   BUN 18.8 06/11/2017 1401   CREATININE 1.07 06/14/2021 0906   CREATININE 1.2 06/11/2017 1401      Component Value Date/Time   CALCIUM 9.1 06/14/2021 0906   CALCIUM 10.1 06/11/2017 1401   ALKPHOS 86 06/14/2021 0906   ALKPHOS 122 06/11/2017 1401   AST 23 06/14/2021 0906   AST 32 06/11/2017 1401   ALT 37 06/14/2021 0906   ALT 53 06/11/2017 1401   BILITOT 1.1 06/14/2021 0906   BILITOT 0.89 06/11/2017 1401       Impression and Plan: Randall Turner is a 70 yo gentleman with hemochromatosis - compound heterozygous. His counts have remained stable and he has not required a phlebotomy in several years.  At this time he would like to follow-up as needed and have his lab work monitored with his PCP.  I have routed today's note to his PCP Dr. Garret Reddish with [facility] so he is aware of the patient's parameters with his iron studies. We can phlebotomize him again if needed.  The patient with contact our office with any questions or concerns.   Laverna Peace, NP 6/28/202212:25 PM

## 2021-07-07 ENCOUNTER — Telehealth: Payer: Self-pay | Admitting: Pharmacist

## 2021-07-07 NOTE — Chronic Care Management (AMB) (Addendum)
Chronic Care Management Pharmacy Assistant   Name: Randall Turner.  MRN: [id number] DOB: [birth year]  Randall Turner. is an 70 y.o. year old male who presents for his initial CCM visit with the clinical pharmacist.  Reason for Encounter: Initial CCM Visit   Conditions to be addressed/monitored: HTN, Asthma, GERD, Hemochromatosis, Hyperglycemia, HLD, Malignant neoplasm of prostate, right foot pain  Primary concerns for visit include: HTN, HLD, Hyperglycemia, Malignant neoplasm of prostate, Hemochromastosis, Asthma  Recent office visits:  01/20/2021 [facility] PCP Garret Reddish, MD; no medication changes indicated.  Recent consult visits:  06/20/2021 [place], NP; stable hemochromatosis, no medication changes indicated.  03/22/2021 [facility] Urology Irine Seal, MD; no information available.  Hospital visits:  None in previous 6 months  Medications: Outpatient Encounter Medications as of 07/07/2021  Medication Sig   aspirin EC 81 MG tablet Take 162 mg by mouth daily.   losartan (COZAAR) 50 MG tablet TAKE 1 TABLET BY MOUTH  DAILY   naproxen sodium (ALEVE) 220 MG tablet Take 220 mg by mouth 2 (two) times daily as needed.   Omega-3 Fatty Acids (OMEGA-3 FISH OIL PO) Take by mouth.   omeprazole (PRILOSEC) 20 MG capsule TAKE 1 CAPSULE BY MOUTH  ONCE DAILY   OVER THE COUNTER MEDICATION Take 1 tablet by mouth. Mens multivitamin without iron   pravastatin (PRAVACHOL) 20 MG tablet TAKE 1 TABLET BY MOUTH  DAILY   PROAIR RESPICLICK 678 (90 Base) MCG/ACT AEPB Inhale 2 puffs by mouth into the lungs every 4 hours as needed for asthma.   No facility-administered encounter medications on file as of 07/07/2021.    Current Medications: Losartan Potassium 50 mg last filled 04/26/2021 90 DS Omeprazole 20 mg last filled 04/20/2021 90 DS Omega-3 Fatty Acids 1200 mg takes daily Pravastatin 20 mg last filled 06/09/2021 90 DS Proair Respiclick 938 mcg/act last filled  04/06/2021 Aspirin 81 mg daily Men's Multivitamin 50+ Flaxseed oil 14,000 mg daily  Vitamin D daily 50 mcg per soft gel Naproxen 220 mg two daily   Patient Questions: Any changes in your medications or health? Patient stated he hasn't had any changes in his medication or health.  Any side effects from any medications?  Patient states is not currently having any issues or side effects from any of his medication.  Do you have any symptoms or problems not managed by your medications? Patient states he does not currently have any symptoms or problems that are not currently managed by his medications.  Any concerns about your health right now? Patient states he does not have any concerns with his health right now.  Has your provider asked that you check blood pressure, blood sugar, or follow special diet at home? Patient states Dr. Yong Channel has asked him before to check his blood pressure or blood sugars at hime. Patient states although he was asked to he does not monitor either at this time. Patient states he does not follow any special diet at this time but he does use intermittent fasting as a way of diet.  Do you get any type of exercise on a regular basis? Patient states he does not exercise regularly but he is very active around the house. He states he is active at least 30 mins a day.  Can you think of a goal you would like to reach for your health? Patient states he would like to lose 10 lbs.  Do you have any problems getting your medications? Patient  states he uses [facility] and doesn't have any problems getting any of his medications.   Is there anything that you would like to discuss during the appointment?  Patient states he has nothing to discuss at this time.  Please bring medications and supplements to appointment  Future Appointments  Date Time Provider [place]  07/11/2021  2:00 PM [facility] CCM PHARMACIST [facility]  07/21/2021  9:40 AM Marin Olp,  MD [facility]  04/16/2022  9:30 AM [facility] HEALTH COACH [facility]    Star Rating Drugs: Losartan Potassium 50 mg last filled 04/26/2021 90 DS Pravastatin 20 mg last filled 06/09/2021 90 DS  April D Calhoun, [place] Pharmacist Assistant [phone]

## 2021-07-10 NOTE — Progress Notes (Signed)
Chronic Care Management Pharmacy Note  07/11/2021 Name:  Randall Turner. MRN:  [id number] DOB:  13-Feb-1951  Summary: Initial visit with PharmD.  Medications reviewed and updated.    Recommendations/Changes made from today's visit: None  Plan: FU with pharmd 6 months   Subjective: Randall Turner. is an 70 y.o. year old male who is a primary patient of Hunter, Brayton Mars, MD.  The CCM team was consulted for assistance with disease management and care coordination needs.    Engaged with patient by telephone for initial visit in response to provider referral for pharmacy case management and/or care coordination services.   Consent to Services:  The patient was given the following information about Chronic Care Management services today, agreed to services, and gave verbal consent: 1. CCM service includes personalized support from designated clinical staff supervised by the primary care provider, including individualized plan of care and coordination with other care providers 2. 24/7 contact phone numbers for assistance for urgent and routine care needs. 3. Service will only be billed when office clinical staff spend 20 minutes or more in a month to coordinate care. 4. Only one practitioner may furnish and bill the service in a calendar month. 5.The patient may stop CCM services at any time (effective at the end of the month) by phone call to the office staff. 6. The patient will be responsible for cost sharing (co-pay) of up to 20% of the service fee (after annual deductible is met). Patient agreed to services and consent obtained.  Patient Care Team: Marin Olp, MD as PCP - General (Family Medicine) Cira Rue, RN Nurse Navigator as Registered Nurse ([place]) Juluis Rainier as Consulting Physician (Optometry) Marin Olp, Rudell Cobb, MD as Consulting Physician (Oncology) Pyrtle, Lajuan Lines, MD as Consulting Physician (Gastroenterology) Irine Seal, MD as Consulting  Physician (Urology) Edythe Clarity, [facility] as Pharmacist (Pharmacist)  Recent office visits:  01/20/2021 [facility] PCP Garret Reddish, MD; no medication changes indicated.   Recent consult visits:  06/20/2021 [place], NP; stable hemochromatosis, no medication changes indicated.   03/22/2021 [facility] Urology Irine Seal, MD; no information available.   Hospital visits:  None in previous 6 months  Current Meds Losartan Potassium 50 mg last filled 04/26/2021 90 DS Omeprazole 20 mg last filled 04/20/2021 90 DS Omega-3 Fatty Acids 1200 mg takes daily Pravastatin 20 mg last filled 06/09/2021 90 DS Proair Respiclick 572 mcg/act last filled 04/06/2021 Aspirin 81 mg daily Men's Multivitamin 50+ Flaxseed oil 14,000 mg daily Vitamin D daily 50 mcg per soft gel Naproxen 220 mg two daily  Objective:  Lab Results  Component Value Date   CREATININE 1.07 06/14/2021   BUN 17 06/14/2021   GFR 73.14 01/20/2021   GFRNONAA >60 06/14/2021   GFRAA >60 06/15/2020   NA 140 06/14/2021   K 4.5 06/14/2021   CALCIUM 9.1 06/14/2021   CO2 25 06/14/2021   GLUCOSE 105 (H) 06/14/2021    Lab Results  Component Value Date/Time   HGBA1C 5.5 01/16/2019 11:14 AM   HGBA1C 5.6 01/03/2018 11:03 AM   GFR 73.14 01/20/2021 11:41 AM   GFR 66.75 01/20/2020 10:29 AM    Last diabetic Eye exam: No results found for: HMDIABEYEEXA  Last diabetic Foot exam: No results found for: HMDIABFOOTEX   Lab Results  Component Value Date   CHOL 156 01/20/2021   HDL 50.50 01/20/2021   LDLCALC 74 01/20/2021   LDLDIRECT 87.0 01/16/2019   TRIG 158.0 (H)  01/20/2021   CHOLHDL 3 01/20/2021    Hepatic Function Latest Ref Rng & Units 06/14/2021 01/20/2021 06/15/2020  Total Protein 6.5 - 8.1 g/dL 6.4(L) 7.0 6.8  Albumin 3.5 - 5.0 g/dL 4.0 4.7 4.3  AST 15 - 41 U/L _0 ALT 0 - 44 U/L 37 36 29  Alk Phosphatase 38 - 126 U/L 86 97 96  Total Bilirubin 0.3 - 1.2 mg/dL 1.1 1.1 1.3(H)    Lab Results  Component  Value Date/Time   TSH 2.754 09/15/2015 10:09 AM   TSH 2.877 08/03/2014 08:44 AM    CBC Latest Ref Rng & Units 06/14/2021 01/20/2021 06/15/2020  WBC 4.0 - 10.5 K/uL 5.1 5.5 4.9  Hemoglobin 13.0 - 17.0 g/dL 15.6 16.4 15.7  Hematocrit 39.0 - 52.0 % 44.4 47.3 45.9  Platelets 150 - 400 K/uL 188 186.0 177    No results found for: VD25OH  Clinical ASCVD: No  The 10-year ASCVD risk score Mikey Bussing DC Jr., et al., 2013) is: 18.1%   Values used to calculate the score:     Age: 41 years     Sex: Male     Is Non-Hispanic African American: No     Diabetic: No     Tobacco smoker: No     Systolic Blood Pressure: 364 mmHg     Is BP treated: Yes     HDL Cholesterol: 50.5 mg/dL     Total Cholesterol: 156 mg/dL    Depression screen [facility] 2/9 04/10/2021 01/20/2021 01/28/2020  Decreased Interest 0 0 0  Down, Depressed, Hopeless 0 0 0  PHQ - 2 Score 0 0 0    Social History   Tobacco Use  Smoking Status Never  Smokeless Tobacco Never   BP Readings from Last 3 Encounters:  06/20/21 139/79  01/20/21 128/86  07/20/20 128/82   Pulse Readings from Last 3 Encounters:  06/20/21 90  01/20/21 80  07/20/20 72   Wt Readings from Last 3 Encounters:  06/20/21 238 lb 1.9 oz (108 kg)  01/20/21 231 lb 3.2 oz (104.9 kg)  07/20/20 (!) 224 lb (101.6 kg)   BMI Readings from Last 3 Encounters:  06/20/21 33.21 kg/m  01/20/21 32.25 kg/m  07/20/20 31.24 kg/m    Assessment/Interventions: Review of patient past medical history, allergies, medications, health status, including review of consultants reports, laboratory and other test data, was performed as part of comprehensive evaluation and provision of chronic care management services.   SDOH:  (Social Determinants of Health) assessments and interventions performed: Yes  Financial Resource Strain: Low Risk    Difficulty of Paying Living Expenses: Not hard at all    SDOH Screenings   Alcohol Screen: Not on file  Depression (PHQ2-9): Low Risk    PHQ-2  Score: 0  Financial Resource Strain: Low Risk    Difficulty of Paying Living Expenses: Not hard at all  Food Insecurity: No Food Insecurity   Worried About Charity fundraiser in the Last Year: Never true   Ran Out of Food in the Last Year: Never true  Housing: Low Risk    Last Housing Risk Score: 0  Physical Activity: Inactive   Days of Exercise per Week: 0 days   Minutes of Exercise per Session: 0 min  Social Connections: Moderately Isolated   Frequency of Communication with Friends and Family: Once a week   Frequency of Social Gatherings with Friends and Family: Once a week   Attends Religious Services: 1 to 4 times per  year   Active Member of Clubs or Organizations: No   Attends Archivist Meetings: Never   Marital Status: Married  Stress: No Stress Concern Present   Feeling of Stress : Not at all  Tobacco Use: Low Risk    Smoking Tobacco Use: Never   Smokeless Tobacco Use: Never  Transportation Needs: No Transportation Needs   Lack of Transportation (Medical): No   Lack of Transportation (Non-Medical): No    CCM Care Plan  Allergies  Allergen Reactions   Cortisone Other (See Comments)    Topical cortisone caused swelling    Medications Reviewed Today     Reviewed by Edythe Clarity, [facility] (Pharmacist) on 07/11/21 at 1454  Med List Status: <None>   Medication Order Taking? Sig Documenting Provider Last Dose Status Informant  aspirin EC 81 MG tablet [id number] Yes Take 162 mg by mouth daily. [provider] Taking Active Self           Med Note Wynetta Emery, [facility] R   Fri Jul 17, 2019  9:28 AM)    losartan (COZAAR) 50 MG tablet [id number] Yes TAKE 1 TABLET BY MOUTH  DAILY Marin Olp, MD Taking Active   naproxen sodium (ALEVE) 220 MG tablet [id number] Yes Take 220 mg by mouth 2 (two) times daily as needed. [provider] Taking Active Self  Omega-3 Fatty Acids (OMEGA-3 FISH OIL PO) [id number] Yes Take by mouth. [provider]  Taking Active   omeprazole (PRILOSEC) 20 MG capsule [id number] Yes TAKE 1 CAPSULE BY MOUTH  ONCE DAILY Marin Olp, MD Taking Active   OVER THE COUNTER MEDICATION [id number] Yes Take 1 tablet by mouth. Mens multivitamin without iron [provider] Taking Active Self  pravastatin (PRAVACHOL) 20 MG tablet [id number] Yes TAKE 1 TABLET BY MOUTH  DAILY Marin Olp, MD Taking Active   PROAIR RESPICLICK [phone] 1009 Base) MCG/ACT AEPB [id number] Yes Inhale 2 puffs by mouth into the lungs every 4 hours as needed for asthma. 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 ([place]) 11/30/2015   Essential hypertension 09/15/2015   Hyperglycemia 07/28/2013   Hyperlipidemia 07/28/2013   Esophageal reflux 07/28/2013    Immunization History  Administered Date(s) Administered   Fluad Quad(high Dose 65+) 09/15/2020   Influenza Split 10/30/2013   Influenza, High Dose Seasonal PF 09/11/2018, 08/26/2019   Influenza,inj,Quad PF,6+ Mos 08/03/2014, 09/15/2015, 09/13/2016   Influenza-Unspecified 11/11/2017   PFIZER(Purple Top)SARS-COV-2 Vaccination 02/13/2020, 03/08/2020, 10/11/2020   Pneumococcal Conjugate-13 01/03/2018   Pneumococcal Polysaccharide-23 01/16/2019   Pneumococcal-Unspecified 11/22/2006   Tdap 02/18/2007, 05/16/2017   Zoster, Live 12/06/2013    Conditions to be addressed/monitored:  HTN, Asthma, Reflux, HLD  Care Plan : General Pharmacy (Adult)  Updates made by Edythe Clarity, RPH since 07/11/2021 12:00 AM     Problem: HTN, Asthma, Reflux, HLD   Priority: High  Onset Date: 07/11/2021      Long-Range Goal: Patient-Specific Goal   Start Date: 07/11/2021  Expected End Date: 01/11/2022  This Visit's Progress: On track  Priority: High  Note:  Current Barriers:  None noted at this visit  Pharmacist Clinical Goal(s):  Patient will maintain control of BP and cholesterol as evidenced by monitoring/labs  through collaboration with PharmD and provider.   Interventions: 1:1 collaboration with Marin Olp, MD regarding development and update of comprehensive plan of care as evidenced by provider attestation and co-signature Inter-disciplinary care team collaboration (see longitudinal plan of care) Comprehensive medication review performed; medication list updated in electronic medical record  Hypertension (BP goal <140/90) -Controlled -Current treatment: Losartan 33m daily -Medications previously tried: none noted  -Current home readings: 134/82 today, does not check regularly  -Current exercise habits: no strict exercise but is "active" for at least 30 minutes daily around the house -Denies hypotensive/hypertensive symptoms -Educated on BP goals and benefits of medications for prevention of heart attack, stroke and kidney damage; Exercise goal of 150 minutes per week; Importance of home blood pressure monitoring; Symptoms of hypotension and importance of maintaining adequate hydration; -Counseled to monitor BP at home periodically, document, and provide log at future appointments -Recommended to continue current medication  Hyperlipidemia: (LDL goal < 100) -Controlled -Current treatment: Pravastatin 271mdaily -Medications previously tried: none noted   -Educated on Cholesterol goals;  Benefits of statin for ASCVD risk reduction; Importance of limiting foods high in cholesterol; Most recent LDL is controlled, patient taking medication appropriately -Recommended to continue current medication  Asthma(Goal: control symptoms and prevent  exacerbations) -Controlled -Current treatment  Proair Respiclick [id number] -Medications previously tried: none noted   -Pulmonary function testing: Pulmonary Functions Testing Results:  No results found for: FEV1, FVC, FEV1FVC, TLC, DLCO  -Exacerbations requiring treatment in last 6 months: none -Patient denies consistent use of maintenance inhaler - does not have one -Frequency of rescue inhaler use: infrequently -Counseled on When to use rescue inhaler -Recommended to continue current medication  Reflux/Hiatal hernia (Goal: Minimize symptoms) -Controlled -Current treatment  Omeprazole 207maily -Medications previously tried: none noted -Takes medication appropriately -Is still having some occasions where things are feeling as if they are getting stuck when he tries to swallow.  Currently tolerable, he elects not to have scope of any sort unless problem worsens.  -Recommended to continue current medication  Patient Goals/Self-Care Activities Patient will:  - take medications as prescribed check blood pressure periodically, document, and provide at future appointments target a minimum of 150 minutes of moderate intensity exercise weekly  Follow Up Plan: The care management team will reach out to the patient again over the next 180 days.        Medication Assistance: None required.  Patient affirms current coverage meets needs.  Compliance/Adherence/Medication fill history: Care Gaps: Zoster Vaccines  Star-Rating Drugs: Losartan Potassium 50 mg last filled 04/26/2021 90 DS Pravastatin 20 mg last filled 06/09/2021 90 DS  Patient's preferred pharmacy is:  [facility] [address] [place]one: [phone]: [phone]  ([address] [place] [id number]: [phone]: [address] [place] [place]one: [phone]: [phone] [place] [address] [address]one: [phone]: [phone] pill box? No - wife organizes medications Pt endorses  100% compliance  We discussed: Benefits of medication synchronization, packaging and delivery as well as enhanced pharmacist oversight with [facility]. Patient decided to: Continue current medication management strategy  Care Plan and Follow Up Patient Decision:  Patient agrees to Care Plan and Follow-up.  Plan: The care management team will reach out to the patient again over the next 180 days.  Beverly Milch, PharmD Clinical Pharmacist [phone]

## 2021-07-11 ENCOUNTER — Ambulatory Visit (INDEPENDENT_AMBULATORY_CARE_PROVIDER_SITE_OTHER): Payer: Medicare Other | Admitting: Pharmacist

## 2021-07-11 DIAGNOSIS — J4520 Mild intermittent asthma, uncomplicated: Secondary | ICD-10-CM

## 2021-07-11 DIAGNOSIS — E785 Hyperlipidemia, unspecified: Secondary | ICD-10-CM

## 2021-07-11 DIAGNOSIS — I10 Essential (primary) hypertension: Secondary | ICD-10-CM | POA: Diagnosis not present

## 2021-07-11 NOTE — Patient Instructions (Addendum)
Visit Information   Goals Addressed             This Visit's Progress    Track and Manage My Blood Pressure-Hypertension       Timeframe:  Long-Range Goal Priority:  High Start Date:  07/11/21                           Expected End Date: 01/11/22                      Follow Up Date 10/22/21    - check blood pressure weekly - choose a place to take my blood pressure (home, clinic or office, retail store) - write blood pressure results in a log or diary    Why is this important?   You won't feel high blood pressure, but it can still hurt your blood vessels.  High blood pressure can cause heart or kidney problems. It can also cause a stroke.  Making lifestyle changes like losing a little weight or eating less salt will help.  Checking your blood pressure at home and at different times of the day can help to control blood pressure.  If the doctor prescribes medicine remember to take it the way the doctor ordered.  Call the office if you cannot afford the medicine or if there are questions about it.     Notes:        Patient Care Plan: General Pharmacy (Adult)     Problem Identified: HTN, Asthma, Reflux, HLD   Priority: High  Onset Date: 07/11/2021     Long-Range Goal: Patient-Specific Goal   Start Date: 07/11/2021  Expected End Date: 01/11/2022  This Visit's Progress: On track  Priority: High  Note:   Current Barriers:  None noted at this visit  Pharmacist Clinical Goal(s):  Patient will maintain control of BP and cholesterol as evidenced by monitoring/labs  through collaboration with PharmD and provider.   Interventions: 1:1 collaboration with Marin Olp, MD regarding development and update of comprehensive plan of care as evidenced by provider attestation and co-signature Inter-disciplinary care team collaboration (see longitudinal plan of care) Comprehensive medication review performed; medication list updated in electronic medical record  Hypertension  (BP goal <140/90) -Controlled -Current treatment: Losartan 50mg  daily -Medications previously tried: none noted  -Current home readings: 134/82 today, does not check regularly  -Current exercise habits: no strict exercise but is "active" for at least 30 minutes daily around the house -Denies hypotensive/hypertensive symptoms -Educated on BP goals and benefits of medications for prevention of heart attack, stroke and kidney damage; Exercise goal of 150 minutes per week; Importance of home blood pressure monitoring; Symptoms of hypotension and importance of maintaining adequate hydration; -Counseled to monitor BP at home periodically, document, and provide log at future appointments -Recommended to continue current medication  Hyperlipidemia: (LDL goal < 100) -Controlled -Current treatment: Pravastatin 20mg  daily -Medications previously tried: none noted   -Educated on Cholesterol goals;  Benefits of statin for ASCVD risk reduction; Importance of limiting foods high in cholesterol; Most recent LDL is controlled, patient taking medication appropriately -Recommended to continue current medication  Asthma(Goal: control symptoms and prevent exacerbations) -Controlled -Current treatment  Proair Respiclick [id number] prn -Medications previously tried: none noted   -Pulmonary function testing: Pulmonary Functions Testing Results:  No results found for: FEV1, FVC, FEV1FVC, TLC, DLCO  -Exacerbations requiring treatment in last 6 months: none -Patient denies consistent use of maintenance inhaler -  does not have one -Frequency of rescue inhaler use: infrequently -Counseled on When to use rescue inhaler -Recommended to continue current medication  Reflux/Hiatal hernia (Goal: Minimize symptoms) -Controlled -Current treatment  Omeprazole 20mg  daily -Medications previously tried: none noted -Takes medication appropriately -Is still having some occasions where things are feeling as if they  are getting stuck when he tries to swallow.  Currently tolerable, he elects not to have scope of any sort unless problem worsens.  -Recommended to continue current medication  Patient Goals/Self-Care Activities Patient will:  - take medications as prescribed check blood pressure periodically, document, and provide at future appointments target a minimum of 150 minutes of moderate intensity exercise weekly  Follow Up Plan: The care management team will reach out to the patient again over the next 180 days.       Mr. Courtwright was given information about Chronic Care Management services today including:  CCM service includes personalized support from designated clinical staff supervised by his physician, including individualized plan of care and coordination with other care providers 24/7 contact phone numbers for assistance for urgent and routine care needs. Standard insurance, coinsurance, copays and deductibles apply for chronic care management only during months in which we provide at least 20 minutes of these services. Most insurances cover these services at 100%, however patients may be responsible for any copay, coinsurance and/or deductible if applicable. This service may help you avoid the need for more expensive face-to-face services. Only one practitioner may furnish and bill the service in a calendar month. The patient may stop CCM services at any time (effective at the end of the month) by phone call to the office staff.  Patient agreed to services and verbal consent obtained.   The patient verbalized understanding of instructions, educational materials, and care plan provided today and agreed to receive a mailed copy of patient instructions, educational materials, and care plan.  Telephone follow up appointment with pharmacy team member scheduled for: 6 months  Edythe Clarity, [place]

## 2021-07-17 ENCOUNTER — Telehealth: Payer: Self-pay | Admitting: Pharmacist

## 2021-07-17 NOTE — Progress Notes (Addendum)
    Chronic Care Management Pharmacy Assistant   Name: Randall Turner.  MRN: [id number] DOB: August 24, 1951  Reason for Encounter: [place]   Conditions to be addressed/monitored: HTN, Asthma, Reflux, HLD  Medications: Outpatient Encounter Medications as of 07/17/2021  Medication Sig   aspirin EC 81 MG tablet Take 162 mg by mouth daily.   losartan (COZAAR) 50 MG tablet TAKE 1 TABLET BY MOUTH  DAILY   naproxen sodium (ALEVE) 220 MG tablet Take 220 mg by mouth 2 (two) times daily as needed.   Omega-3 Fatty Acids (OMEGA-3 FISH OIL PO) Take by mouth.   omeprazole (PRILOSEC) 20 MG capsule TAKE 1 CAPSULE BY MOUTH  ONCE DAILY   OVER THE COUNTER MEDICATION Take 1 tablet by mouth. Mens multivitamin without iron   pravastatin (PRAVACHOL) 20 MG tablet TAKE 1 TABLET BY MOUTH  DAILY   PROAIR RESPICLICK 123XX123 (90 Base) MCG/ACT AEPB Inhale 2 puffs by mouth into the lungs every 4 hours as needed for asthma.   No facility-administered encounter medications on file as of 07/17/2021.   Reviewed the patients initial visit reinsured it was completed per the pharmacist Leata Mouse request. Printed the CCM care plan. Mailed the patient CCM care plan to their most recent address on file.  Follow-Up:Pharmacist Review  Charlann Lange, [place] Pharmacist Assistant [phone]

## 2021-07-21 ENCOUNTER — Encounter: Payer: Self-pay | Admitting: Family Medicine

## 2021-07-21 ENCOUNTER — Other Ambulatory Visit: Payer: Self-pay

## 2021-07-21 ENCOUNTER — Ambulatory Visit (INDEPENDENT_AMBULATORY_CARE_PROVIDER_SITE_OTHER): Payer: Medicare Other | Admitting: Family Medicine

## 2021-07-21 DIAGNOSIS — I10 Essential (primary) hypertension: Secondary | ICD-10-CM | POA: Diagnosis not present

## 2021-07-21 DIAGNOSIS — E785 Hyperlipidemia, unspecified: Secondary | ICD-10-CM | POA: Diagnosis not present

## 2021-07-21 DIAGNOSIS — K219 Gastro-esophageal reflux disease without esophagitis: Secondary | ICD-10-CM | POA: Diagnosis not present

## 2021-07-21 NOTE — Progress Notes (Signed)
Phone [phone] In person visit   Subjective:   Randall Turner. is a 70 y.o. year old very pleasant male patient who presents for/with See problem oriented charting Chief Complaint  Patient presents with   Hypertension    This visit occurred during the SARS-CoV-2 public health emergency.  Safety protocols were in place, including screening questions prior to the visit, additional usage of staff PPE, and extensive cleaning of exam room while observing appropriate contact time as indicated for disinfecting solutions.   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 ([place]) 11/30/2015    Priority: High   Elevated LFTs 07/17/2019    Priority: Medium   Asthma 08/09/2017    Priority: Medium   Essential hypertension 09/15/2015    Priority: Medium   Hyperglycemia 07/28/2013    Priority: Medium   Hyperlipidemia 07/28/2013    Priority: Medium   Esophageal reflux 07/28/2013    Priority: Medium   Closed fracture of fifth metatarsal bone of right foot 11/24/2018    Priority: Low   Right foot pain 11/24/2018    Priority: Low   History of adenomatous polyp of colon 08/09/2017    Priority: Low    Medications- reviewed and updated Current Outpatient Medications  Medication Sig Dispense Refill   aspirin EC 81 MG tablet Take 162 mg by mouth daily.     losartan (COZAAR) 50 MG tablet TAKE 1 TABLET BY MOUTH  DAILY 90 tablet 3   naproxen sodium (ALEVE) 220 MG tablet Take 220 mg by mouth 2 (two) times daily as needed.     Omega-3 Fatty Acids (OMEGA-3 FISH OIL PO) Take by mouth.     omeprazole (PRILOSEC) 20 MG capsule TAKE 1 CAPSULE BY MOUTH  ONCE DAILY 90 capsule 3   OVER THE COUNTER MEDICATION Take 1 tablet by mouth. Mens multivitamin without iron     pravastatin (PRAVACHOL) 20 MG tablet TAKE 1 TABLET BY MOUTH   DAILY 90 tablet 3   PROAIR RESPICLICK 123XX123 (90 Base) MCG/ACT AEPB Inhale 2 puffs by mouth into the lungs every 4 hours as needed for asthma. 1 each 4   No current facility-administered medications for this visit.     Objective:  BP 120/78 (BP Location: Left Arm, Patient Position: Sitting, Cuff Size: Large)   Pulse 82   Temp 98.5 F (36.9 C) (Temporal)   Ht '5\' 11"'$  (1.803 m)   Wt 240 lb 8 oz (109.1 kg)   SpO2 95%   BMI 33.54 kg/m  Gen: NAD, resting comfortably CV: RRR no murmurs rubs or gallops Lungs: CTAB no crackles, wheeze, rhonchi Abdomen: soft/nontender/nondistended/normal bowel sounds.  Ext: no edema Skin: warm, dry    Assessment and Plan    #Hemochromatosis- followed with [name] S: Ferritin goal under 100.  Has had mild LFT elevation in the past-monitoring as long as remains under 100. Has not needed phlebotomy for years  From hematology notes "Current Therapy:        Phlebotomy to maintain ferritin less than 100 and iron saturation less than 50%." A/P: we will check levels next visit but controlled within last month- we will assume his care at this point.    Lab Results  Component Value Date   ALT 37 06/14/2021   AST 23 06/14/2021   ALKPHOS 86 06/14/2021   BILITOT 1.1  06/14/2021  Normal LFTs last month- do not need to repeat at this time   #Hypertension S: Controlled on losartan 50 mg -up 9 lbs from January- on home scales 236 and 6 months ago was 227. He plans to limit food intake. Still down from home peak of 265.  BP Readings from Last 3 Encounters:  07/21/21 120/78  06/20/21 139/79  01/20/21 128/86  A/P: Stable. Continue current medications.      #Hyperlipidemia S: Compliant with pravastatin 20 mg. Takes asa '162mg'$ . Also on omega 3 fish oil and flaxseed- he is contemplating stopping- I think that's reasonable Lab Results  Component Value Date   CHOL 156 01/20/2021   HDL 50.50 01/20/2021   LDLCALC 74 01/20/2021   LDLDIRECT 87.0 01/16/2019   TRIG  158.0 (H) 01/20/2021   CHOLHDL 3 01/20/2021  A/P: reasonably well controlled- continue current meds     #Asthma S: No controller.  Sparingly uses albuterol A/P: very rare issues since childhood- no recent issues     #GERD S: History of Schatzki's ring. On omeprazole with Dr. [name] previously- we rxed last visit A/P: reasonably well controlled- hesitant to stop medicine with schatzki's ring history unless GI advises- continue current meds    #prostate cancer history- history of radioactive seed implants- he reports reassuring psa levels  Lab Results  Component Value Date   PSA 2.76 11/09/2019   PSA 3.47 04/07/2019   PSA 4.16 (H) 01/16/2019   Recommended follow up: Return in about 6 months (around 01/21/2022) for physical or sooner if needed. Future Appointments  Date Time Provider [place]  01/16/2022  2:00 PM [facility] CCM PHARMACIST [facility]  04/16/2022  9:30 AM [facility] HEALTH COACH [facility]    Lab/Order associations:   ICD-10-CM   1. 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 Hyperlipidemia, unspecified hyperlipidemia type  E78.5     3. Essential hypertension  I10     4. Gastroesophageal reflux disease, unspecified whether esophagitis present  K21.9      Return precautions advised.  Garret Reddish, MD

## 2021-07-21 NOTE — Patient Instructions (Addendum)
Health Maintenance Due  Topic Date Due   Zoster Vaccines- Shingrix (1 of 2)  Please check with your pharmacy to see if they have the shingrix vaccine. If they do- please get this immunization and update [place] by phone call or mychart with dates you receive the vaccine  Never done   COVID-19 Vaccine (4 - Booster for [employer] series)- can consider this, send [place] a message if you get it 01/11/2021   No changes today other than [place] taking over the hemochromatosis management at this point  Recommended follow up: Return in about 6 months (around 01/21/2022) for physical or sooner if needed.

## 2021-09-13 LAB — PSA: PSA: 0.94

## 2021-09-18 ENCOUNTER — Encounter: Payer: Self-pay | Admitting: Family Medicine

## 2021-09-18 ENCOUNTER — Ambulatory Visit: Payer: Medicare Other

## 2021-09-18 DIAGNOSIS — Z23 Encounter for immunization: Secondary | ICD-10-CM

## 2021-09-18 NOTE — Progress Notes (Signed)
Randall Turner a 70 yo man presented in the office today for his HIGH DOSE flu immunization. I administered 0.5 ML of the FLUAD QUADRIVALENT into his right arm. He tolerated the vaccine very well. His injection site looked fine.

## 2021-09-20 DIAGNOSIS — Z87442 Personal history of urinary calculi: Secondary | ICD-10-CM | POA: Diagnosis not present

## 2021-10-27 ENCOUNTER — Other Ambulatory Visit: Payer: Self-pay | Admitting: Family Medicine

## 2021-12-19 ENCOUNTER — Other Ambulatory Visit: Payer: Self-pay | Admitting: Family Medicine

## 2022-01-03 NOTE — Progress Notes (Deleted)
Chronic Care Management Pharmacy Note  01/03/2022 Name:  Randall Turner. MRN:  [id number] DOB:  23-Jan-1951  Summary: Initial visit with PharmD.  Medications reviewed and updated.    Recommendations/Changes made from today's visit: None  Plan: FU with pharmd 6 months   Subjective: Randall Turner. is an 71 y.o. year old male who is a primary patient of Hunter, Brayton Mars, MD.  The CCM team was consulted for assistance with disease management and care coordination needs.    Engaged with patient by telephone for initial visit in response to provider referral for pharmacy case management and/or care coordination services.   Consent to Services:  The patient was given the following information about Chronic Care Management services today, agreed to services, and gave verbal consent: 1. CCM service includes personalized support from designated clinical staff supervised by the primary care provider, including individualized plan of care and coordination with other care providers 2. 24/7 contact phone numbers for assistance for urgent and routine care needs. 3. Service will only be billed when office clinical staff spend 20 minutes or more in a month to coordinate care. 4. Only one practitioner may furnish and bill the service in a calendar month. 5.The patient may stop CCM services at any time (effective at the end of the month) by phone call to the office staff. 6. The patient will be responsible for cost sharing (co-pay) of up to 20% of the service fee (after annual deductible is met). Patient agreed to services and consent obtained.  Patient Care Team: Marin Olp, MD as PCP - General (Family Medicine) Cira Rue, RN Nurse Navigator as Registered Nurse ([place]) Juluis Rainier as Consulting Physician (Optometry) Marin Olp, Rudell Cobb, MD as Consulting Physician (Oncology) Pyrtle, Lajuan Lines, MD as Consulting Physician (Gastroenterology) Irine Seal, MD as Consulting  Physician (Urology) Edythe Clarity, [facility] as Pharmacist (Pharmacist)  Recent office visits:  01/20/2021 OV PCP Garret Reddish, MD; no medication changes indicated.   Recent consult visits:  06/20/2021 [place], NP; stable hemochromatosis, no medication changes indicated.   03/22/2021 [facility] Urology Irine Seal, MD; no information available.   Hospital visits:  None in previous 6 months  Current Meds Losartan Potassium 50 mg last filled 04/26/2021 90 DS Omeprazole 20 mg last filled 04/20/2021 90 DS Omega-3 Fatty Acids 1200 mg takes daily Pravastatin 20 mg last filled 06/09/2021 90 DS Proair Respiclick 937 mcg/act last filled 04/06/2021 Aspirin 81 mg daily Men's Multivitamin 50+ Flaxseed oil 14,000 mg daily Vitamin D daily 50 mcg per soft gel Naproxen 220 mg two daily  Objective:  Lab Results  Component Value Date   CREATININE 1.07 06/14/2021   BUN 17 06/14/2021   GFR 73.14 01/20/2021   GFRNONAA >60 06/14/2021   GFRAA >60 06/15/2020   NA 140 06/14/2021   K 4.5 06/14/2021   CALCIUM 9.1 06/14/2021   CO2 25 06/14/2021   GLUCOSE 105 (H) 06/14/2021    Lab Results  Component Value Date/Time   HGBA1C 5.5 01/16/2019 11:14 AM   HGBA1C 5.6 01/03/2018 11:03 AM   GFR 73.14 01/20/2021 11:41 AM   GFR 66.75 01/20/2020 10:29 AM    Last diabetic Eye exam: No results found for: HMDIABEYEEXA  Last diabetic Foot exam: No results found for: HMDIABFOOTEX   Lab Results  Component Value Date   CHOL 156 01/20/2021   HDL 50.50 01/20/2021   LDLCALC 74 01/20/2021   LDLDIRECT 87.0 01/16/2019   TRIG 158.0 (H)  01/20/2021   CHOLHDL 3 01/20/2021    Hepatic Function Latest Ref Rng & Units 06/14/2021 01/20/2021 06/15/2020  Total Protein 6.5 - 8.1 g/dL 6.4(L) 7.0 6.8  Albumin 3.5 - 5.0 g/dL 4.0 4.7 4.3  AST 15 - 41 U/L 23 26 23   ALT 0 - 44 U/L 37 36 29  Alk Phosphatase 38 - 126 U/L 86 97 96  Total Bilirubin 0.3 - 1.2 mg/dL 1.1 1.1 1.3(H)    Lab Results  Component  Value Date/Time   TSH 2.754 09/15/2015 10:09 AM   TSH 2.877 08/03/2014 08:44 AM    CBC Latest Ref Rng & Units 06/14/2021 01/20/2021 06/15/2020  WBC 4.0 - 10.5 K/uL 5.1 5.5 4.9  Hemoglobin 13.0 - 17.0 g/dL 15.6 16.4 15.7  Hematocrit 39.0 - 52.0 % 44.4 47.3 45.9  Platelets 150 - 400 K/uL 188 186.0 177    No results found for: VD25OH  Clinical ASCVD: No  The 10-year ASCVD risk score (Arnett DK, et al., 2019) is: 15.4%   Values used to calculate the score:     Age: 38 years     Sex: Male     Is Non-Hispanic African American: No     Diabetic: No     Tobacco smoker: No     Systolic Blood Pressure: 009 mmHg     Is BP treated: Yes     HDL Cholesterol: 50.5 mg/dL     Total Cholesterol: 156 mg/dL    Depression screen [facility] 2/9 04/10/2021 01/20/2021 01/28/2020  Decreased Interest 0 0 0  Down, Depressed, Hopeless 0 0 0  PHQ - 2 Score 0 0 0    Social History   Tobacco Use  Smoking Status Never  Smokeless Tobacco Never   BP Readings from Last 3 Encounters:  07/21/21 120/78  06/20/21 139/79  01/20/21 128/86   Pulse Readings from Last 3 Encounters:  07/21/21 82  06/20/21 90  01/20/21 80   Wt Readings from Last 3 Encounters:  07/21/21 240 lb 8 oz (109.1 kg)  06/20/21 238 lb 1.9 oz (108 kg)  01/20/21 231 lb 3.2 oz (104.9 kg)   BMI Readings from Last 3 Encounters:  07/21/21 33.54 kg/m  06/20/21 33.21 kg/m  01/20/21 32.25 kg/m    Assessment/Interventions: Review of patient past medical history, allergies, medications, health status, including review of consultants reports, laboratory and other test data, was performed as part of comprehensive evaluation and provision of chronic care management services.   SDOH:  (Social Determinants of Health) assessments and interventions performed: Yes  Financial Resource Strain: Low Risk    Difficulty of Paying Living Expenses: Not hard at all    SDOH Screenings   Alcohol Screen: Not on file  Depression (PHQ2-9): Low Risk    PHQ-2  Score: 0  Financial Resource Strain: Low Risk    Difficulty of Paying Living Expenses: Not hard at all  Food Insecurity: No Food Insecurity   Worried About Charity fundraiser in the Last Year: Never true   Ran Out of Food in the Last Year: Never true  Housing: Low Risk    Last Housing Risk Score: 0  Physical Activity: Inactive   Days of Exercise per Week: 0 days   Minutes of Exercise per Session: 0 min  Social Connections: Moderately Isolated   Frequency of Communication with Friends and Family: Once a week   Frequency of Social Gatherings with Friends and Family: Once a week   Attends Religious Services: 1 to 4 times per  year   Active Member of Clubs or Organizations: No   Attends Archivist Meetings: Never   Marital Status: Married  Stress: No Stress Concern Present   Feeling of Stress : Not at all  Tobacco Use: Low Risk    Smoking Tobacco Use: Never   Smokeless Tobacco Use: Never   Passive Exposure: Not on file  Transportation Needs: No Transportation Needs   Lack of Transportation (Medical): No   Lack of Transportation (Non-Medical): No    CCM Care Plan  Allergies  Allergen Reactions   Cortisone Other (See Comments)    Topical cortisone caused swelling    Medications Reviewed Today     Reviewed by Marian Sorrow, LPN (Licensed Practical Nurse) on 07/21/21 at [phone]  Med List Status: <None>   Medication Order Taking? Sig Documenting Provider Last Dose Status Informant  aspirin EC 81 MG tablet [id number] Yes Take 162 mg by mouth daily. [provider] Taking Active Self           Med Note Wynetta Emery, [facility] R   Fri Jul 17, 2019  9:28 AM)    losartan (COZAAR) 50 MG tablet [id number] Yes TAKE 1 TABLET BY MOUTH  DAILY Marin Olp, MD Taking Active   naproxen sodium (ALEVE) 220 MG tablet [id number] Yes Take 220 mg by mouth 2 (two) times daily as needed. [provider] Taking Active Self  Omega-3 Fatty Acids (OMEGA-3 FISH OIL PO) [id number]  Yes Take by mouth. [provider] Taking Active   omeprazole (PRILOSEC) 20 MG capsule [id number] Yes TAKE 1 CAPSULE BY MOUTH  ONCE DAILY Marin Olp, MD Taking Active   OVER THE COUNTER MEDICATION [id number] Yes Take 1 tablet by mouth. Mens multivitamin without iron [provider] Taking Active Self  pravastatin (PRAVACHOL) 20 MG tablet [id number] Yes TAKE 1 TABLET BY MOUTH  DAILY Marin Olp, MD Taking Active   PROAIR RESPICLICK 280 [phone] Base) MCG/ACT AEPB [id number] Yes Inhale 2 puffs by mouth into the lungs every 4 hours as needed for asthma. 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 ([place]) 11/30/2015   Essential hypertension 09/15/2015   Hyperglycemia 07/28/2013   Hyperlipidemia 07/28/2013   Esophageal reflux 07/28/2013    Immunization History  Administered Date(s) Administered   Fluad Quad(high Dose 65+) 09/15/2020, 09/18/2021   Influenza Split 10/30/2013   Influenza, High Dose Seasonal PF 09/11/2018, 08/26/2019   Influenza,inj,Quad PF,6+ Mos 08/03/2014, 09/15/2015, 09/13/2016   Influenza-Unspecified 11/11/2017   PFIZER(Purple Top)SARS-COV-2 Vaccination 02/13/2020, 03/08/2020, 10/11/2020   Pneumococcal Conjugate-13 01/03/2018   Pneumococcal Polysaccharide-23 01/16/2019   Pneumococcal-Unspecified 11/22/2006   Tdap 02/18/2007, 05/16/2017   Zoster, Live 12/06/2013    Conditions to be addressed/monitored:  HTN, Asthma, Reflux, HLD  There are no care plans that you recently modified to display for this patient.     Medication Assistance: None required.  Patient affirms  current coverage meets needs.  Compliance/Adherence/Medication fill history: Care Gaps: Zoster Vaccines  Star-Rating Drugs: Losartan Potassium 50 mg last filled 04/26/2021 90 DS Pravastatin 20 mg last filled 06/09/2021 90  DS  Patient's preferred pharmacy is:  [place], [place] [address] Phone: [phone] Fax: [phone]  [facility] ([place], [place] [facility] [place] [address] [place] [address] Phone: [phone] Fax: [place], [place] [address] [place] [address] Phone: [phone] Fax: [phone]  [place], [address] [place] [address] Phone: [phone] Fax: [phone]  [facility] ([facility] ) - [place], [address] [place] [address] Phone: [phone] Fax: [phone]   Uses pill box? No - wife organizes medications Pt endorses 100% compliance  We discussed: Benefits of medication synchronization, packaging and delivery as well as enhanced pharmacist oversight with [employer]. Patient decided to: Continue current medication management strategy  Care Plan and Follow Up Patient Decision:  Patient agrees to Care Plan and Follow-up.  Plan: The care management team will reach out to the patient again over the next 180 days.  Beverly Milch, PharmD Clinical Pharmacist [phone]   Current Barriers:  None noted at this visit  Pharmacist Clinical Goal(s):  Patient will maintain control of BP and cholesterol as evidenced by monitoring/labs  through collaboration with PharmD and provider.   Interventions: 1:1 collaboration with Marin Olp, MD regarding development and  update of comprehensive plan of care as evidenced by provider attestation and co-signature Inter-disciplinary care team collaboration (see longitudinal plan of care) Comprehensive medication review performed; medication list updated in electronic medical record  Hypertension (BP goal <140/90) -Controlled -Current treatment: Losartan 81m daily -Medications previously tried: none noted  -Current home readings: 134/82 today, does not check regularly  -Current exercise habits: no strict exercise but is "active" for at least 30 minutes daily around the house -Denies hypotensive/hypertensive symptoms -Educated on BP goals and benefits of medications for prevention of heart attack, stroke and kidney damage; Exercise goal of 150 minutes per week; Importance of home blood pressure monitoring; Symptoms of hypotension and importance of maintaining adequate hydration; -Counseled to monitor BP at home periodically, document, and provide log at future appointments -Recommended to continue current medication  Hyperlipidemia: (LDL goal < 100) -Controlled -Current treatment: Pravastatin 258mdaily -Medications previously tried: none noted   -Educated on Cholesterol goals;  Benefits of statin for ASCVD risk reduction; Importance of limiting foods high in cholesterol; Most recent LDL is controlled, patient taking medication appropriately -Recommended to continue current medication  Asthma(Goal: control symptoms and prevent exacerbations) -Controlled -Current treatment  Proair Respiclick [id number] -Medications previously tried: none noted   -Pulmonary function testing: Pulmonary Functions Testing Results:  No results found for: FEV1, FVC, FEV1FVC, TLC, DLCO  -Exacerbations requiring treatment in last 6 months: none -Patient denies consistent use of maintenance inhaler - does not have one -Frequency of rescue inhaler use: infrequently -Counseled on When to use rescue inhaler -Recommended to  continue current medication  Reflux/Hiatal hernia (Goal: Minimize symptoms) -Controlled -Current treatment  Omeprazole 2037maily -Medications previously tried: none noted -Takes medication appropriately -Is still having some occasions where things are feeling as if they are getting stuck when he tries to swallow.  Currently tolerable, he elects not to have scope of any sort unless problem worsens.  -Recommended  to continue current medication  Patient Goals/Self-Care Activities Patient will:  - take medications as prescribed check blood pressure periodically, document, and provide at future appointments target a minimum of 150 minutes of moderate intensity exercise weekly  Follow Up Plan: The care management team will reach out to the patient again over the next 180 days.

## 2022-01-16 ENCOUNTER — Telehealth: Payer: Medicare Other

## 2022-01-24 NOTE — Progress Notes (Signed)
Phone: [phone]   Subjective:  Patient presents today for their annual physical. Chief complaint-noted.   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 ([place]) 11/30/2015    Priority: High   Elevated LFTs 07/17/2019    Priority: Medium    Asthma 08/09/2017    Priority: Medium    Essential hypertension 09/15/2015    Priority: Medium    Hyperglycemia 07/28/2013    Priority: Medium    Hyperlipidemia 07/28/2013    Priority: Medium    Esophageal reflux 07/28/2013    Priority: Medium    Closed fracture of fifth metatarsal bone of right foot 11/24/2018    Priority: Low   Right foot pain 11/24/2018    Priority: Low   History of adenomatous polyp of colon 08/09/2017    Priority: Low   Past Surgical History:   Procedure Laterality Date   BUNIONECTOMY Bilateral 2005   COLONOSCOPY  last one 2018   CYSTOSCOPY N/A 08/11/2019   Procedure: CYSTOSCOPY;  Surgeon: Irine Seal, MD;  Location: [facility];  Service: Urology;  Laterality: N/A;   PATELLECTOMY Right 1969   football injury    RADIOACTIVE SEED IMPLANT N/A 08/11/2019   Procedure: RADIOACTIVE SEED IMPLANT/BRACHYTHERAPY IMPLANT;  Surgeon: Irine Seal, MD;  Location: [facility];  Service: Urology;  Laterality: N/A;   SPACE OAR INSTILLATION N/A 08/11/2019   Procedure: SPACE OAR INSTILLATION;  Surgeon: Irine Seal, MD;  Location: [facility];  Service: Urology;  Laterality: N/A;   TONSILLECTOMY  1958    Family History  Problem Relation Age of Onset   Gallstones Mother    Colon cancer Mother    Lung cancer Mother        discovered near death   Heart disease Mother        rhythm and mitral valve prolapse   Alzheimer's disease Father    Prostate cancer Father        radiation when younger- survived   Mesothelioma Father    Heart disease Maternal Grandmother        died at young age from heart issue unkown   Cancer Maternal Grandfather  Heart disease Paternal Grandmother        enlarged heart   Heart attack Paternal Grandfather        age 66   Nephrolithiasis Sister    Diabetes Maternal Aunt    Pancreatic cancer Neg Hx    Stomach cancer Neg Hx     Medications- reviewed and updated Current Outpatient Medications  Medication Sig Dispense Refill   losartan (COZAAR) 50 MG tablet TAKE 1 TABLET BY MOUTH  DAILY 90 tablet 3   naproxen sodium (ALEVE) 220 MG tablet Take 220 mg by mouth 2 (two) times daily as needed.     Omega-3 Fatty Acids (OMEGA-3 FISH OIL PO) Take by mouth.     omeprazole (PRILOSEC) 20 MG capsule TAKE 1 CAPSULE BY MOUTH  ONCE DAILY 90 capsule 3   OVER THE COUNTER MEDICATION Take 1 tablet by mouth. Mens multivitamin without iron     pravastatin (PRAVACHOL) 20 MG tablet TAKE 1  TABLET BY MOUTH  DAILY 90 tablet 3   PROAIR RESPICLICK 850 (90 Base) MCG/ACT AEPB Inhale 2 puffs by mouth into the lungs every 4 hours as needed for asthma. 1 each 3   No current facility-administered medications for this visit.    Allergies-reviewed and updated Allergies  Allergen Reactions   Cortisone Other (See Comments)    Topical cortisone caused swelling    Social History   Social History Narrative   Married 1975. 2 sons. 4 grandkids- all local (21 to 10 in 2018)      [name]   2 years of college.       Hobbies: enjoys watching football with wife [employer], movies, pool in back yard, grandkids.    Objective  Objective:  BP 130/82    Pulse 89    Temp 98.2 F (36.8 C)    Ht 5\' 11"  (1.803 m)    Wt 249 lb 9.6 oz (113.2 kg)    SpO2 97%    BMI 34.81 kg/m  Gen: NAD, resting comfortably HEENT: Mucous membranes are moist. Oropharynx normal Neck: no thyromegaly CV: RRR no murmurs rubs or gallops Lungs: CTAB no crackles, wheeze, rhonchi Abdomen: soft/nontender/nondistended/normal bowel sounds. No rebound or guarding.  Ext: no edema Skin: warm, dry Neuro: grossly normal, moves all extremities, PERRLA    Assessment and Plan  71 y.o. male presenting for annual physical.  Health Maintenance counseling: 1. Anticipatory guidance: Patient counseled regarding regular dental exams -q6 months, eye exams -needs updated - has not seen yet- had encouraged last year,  avoiding smoking and second hand smoke, limiting alcohol to 2 beverages per day-does not drink. No illicit drugs  2. Risk factor reduction:  Advised patient of need for regular exercise and diet rich and fruits and vegetables to reduce risk of heart attack and stroke.  Exercise- not doing as well lately once again- encouraged to restart. Has treadmill and needs to use that as doesn't do well outdoord Diet/weight management--holidays were tough/birthday month- weight tending up again. Prior low of 227 on home scales.  Intermittent fasting has worked for him in the past- plasn to restart Wt Readings from Last 3 Encounters:  01/25/22 249 lb 9.6 oz (113.2 kg)  07/21/21 240 lb 8 oz (109.1 kg)  06/20/21 238 lb 1.9 oz (108 kg)  3. Immunizations/screenings/ancillary studies -DISCUSSED:  -Shingrix vaccination - recommended at [place] booster vaccination #4- recommended at pharmacy Immunization History  Administered Date(s) Administered   Fluad Quad(high Dose 65+) 09/15/2020, 09/18/2021   Influenza Split 10/30/2013  Influenza, High Dose Seasonal PF 09/11/2018, 08/26/2019   Influenza,inj,Quad PF,6+ Mos 08/03/2014, 09/15/2015, 09/13/2016   Influenza-Unspecified 11/11/2017   PFIZER(Purple Top)SARS-COV-2 Vaccination 02/13/2020, 03/08/2020, 10/11/2020   Pneumococcal Conjugate-13 01/03/2018   Pneumococcal Polysaccharide-23 01/16/2019   Pneumococcal-Unspecified 11/22/2006   Tdap 02/18/2007, 05/16/2017   Zoster, Live 12/06/2013  4. Prostate cancer screening- follows with Dr. Jeffie Pollock after radioactive seed implants with Dr. Tresa Moore. PSAs have been going down with urology- 0.95 in September 2021 and 0.94 September 13, 2021 with plan for 68-month follow-up Lab Results  Component Value Date   PSA 2.76 11/09/2019   PSA 3.47 04/07/2019   PSA 4.16 (H) 01/16/2019   5. Colon cancer screening - 01/23/17 with 5 year repeat planned (overdue) with adenomatous polypwith Dr. [name]- refer today 6. Skin cancer screening- no recent dermatology visits. advised regular sunscreen use. Denies worrisome, changing, or new skin lesions.  7. Smoking associated screening (lung cancer screening, AAA screen 65-75, UA)- never smoker 8. STD screening - only active with wife  Status of chronic or acute concerns   #Hemochromatosis-prior followed with [name], now with PCP only S: Ferritin goal under 100. Had mild LFT elevation in the past-monitoring as long as remains under 100. A/P: hopefully stable- update ferritin today. Continue  current meds for now   #Hypertension S: Controlled on losartan 50 mg BP Readings from Last 3 Encounters:  01/25/22 130/82  07/21/21 120/78  06/20/21 139/79  A/P:  Controlled. Continue current medications.    #Hyperlipidemia S: Compliant with pravastatin 20 mg. Previously took aspirin- now off Lab Results  Component Value Date   CHOL 156 01/20/2021   HDL 50.50 01/20/2021   LDLCALC 74 01/20/2021   LDLDIRECT 87.0 01/16/2019   TRIG 158.0 (H) 01/20/2021   CHOLHDL 3 01/20/2021   A/P: Last lipids very close to ideal goal-update full lipid panel today. Stay off aspirin  #Hyperglycemia S: CBGs high in the past. A1c not elevated. Lab Results  Component Value Date   HGBA1C 5.5 01/16/2019   HGBA1C 5.6 01/03/2018   HGBA1C 5.2 09/15/2015   A/P: check cbg with labs- a1c has been stable/not in diabetes range  #Asthma S: No controller.  -sparing wheezing- Sparingly uses albuterol A/P: Controlled. Continue current medications- sparing albuterol    #GERD S: History of Schatzki's ring-taking Prilosec 20 mg -he thinks spasms are GERD related at night mainly- feels like foods get stuck/almost choking or coughing sensation- at least 2 months of symptoms worse but hasbene bothering him for several years. . Better if eats slowly  and small bites- gets a stuck like sensation. In past with EGD years ago was stopped as he had heavy coughing/gagging. Does have hiatal hernia he reports. Coughs up clear sputum occasionally with this. Benadryl or loratadine without clear relief. Cough can disrupt his sleep-  B12 levels related to PPI use: Normal levels in the past A/P: possible poor congrol reflux vs esophageal stenosis- will refer to GI for opinion on EGD- for now continue prilosec 20 mg  - also with weight gain recently wonder if that worsened symptoms  Recommended follow up: Return in about 6 months (around 07/25/2022) for follow up- or sooner if needed. Future Appointments  Date Time Provider  [place]  04/16/2022  9:30 AM [facility] HEALTH COACH [facility]   Lab/Order associations: fasting   ICD-10-CM   1. Preventative health care  Z00.00     2. Hyperglycemia  R73.9     3. Essential hypertension  I10     4.  Hyperlipidemia, unspecified hyperlipidemia type  E78.5     5. 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 Dysphagia, unspecified type  R13.10     7. Gastroesophageal reflux disease, unspecified whether esophagitis present  K21.9     8. High risk medication use  Z79.899       No orders of the defined types were placed in this encounter.   I,Jada Bradford,acting as a scribe for Garret Reddish, MD.,have documented all relevant documentation on the behalf of Garret Reddish, MD,as directed by  Garret Reddish, MD while in the presence of Garret Reddish, MD.  I, Garret Reddish, MD, have reviewed all documentation for this visit. The documentation on 01/25/22 for the exam, diagnosis, procedures, and orders are all accurate and complete.   Return precautions advised.  Garret Reddish, MD

## 2022-01-25 ENCOUNTER — Encounter: Payer: Self-pay | Admitting: Family Medicine

## 2022-01-25 ENCOUNTER — Ambulatory Visit (INDEPENDENT_AMBULATORY_CARE_PROVIDER_SITE_OTHER): Payer: Medicare Other | Admitting: Family Medicine

## 2022-01-25 ENCOUNTER — Other Ambulatory Visit: Payer: Self-pay

## 2022-01-25 VITALS — BP 130/82 | HR 89 | Temp 98.2°F | Ht 71.0 in | Wt 249.6 lb

## 2022-01-25 DIAGNOSIS — K219 Gastro-esophageal reflux disease without esophagitis: Secondary | ICD-10-CM | POA: Diagnosis not present

## 2022-01-25 DIAGNOSIS — E785 Hyperlipidemia, unspecified: Secondary | ICD-10-CM

## 2022-01-25 DIAGNOSIS — Z0000 Encounter for general adult medical examination without abnormal findings: Secondary | ICD-10-CM | POA: Diagnosis not present

## 2022-01-25 DIAGNOSIS — I10 Essential (primary) hypertension: Secondary | ICD-10-CM | POA: Diagnosis not present

## 2022-01-25 DIAGNOSIS — R739 Hyperglycemia, unspecified: Secondary | ICD-10-CM | POA: Diagnosis not present

## 2022-01-25 DIAGNOSIS — R1310 Dysphagia, unspecified: Secondary | ICD-10-CM | POA: Diagnosis not present

## 2022-01-25 DIAGNOSIS — Z1211 Encounter for screening for malignant neoplasm of colon: Secondary | ICD-10-CM

## 2022-01-25 DIAGNOSIS — Z79899 Other long term (current) drug therapy: Secondary | ICD-10-CM

## 2022-01-25 LAB — FERRITIN: Ferritin: 36.5 ng/mL (ref 22.0–322.0)

## 2022-01-25 LAB — COMPREHENSIVE METABOLIC PANEL
ALT: 62 U/L — ABNORMAL HIGH (ref 0–53)
AST: 39 U/L — ABNORMAL HIGH (ref 0–37)
Albumin: 4.6 g/dL (ref 3.5–5.2)
Alkaline Phosphatase: 91 U/L (ref 39–117)
BUN: 20 mg/dL (ref 6–23)
CO2: 26 mEq/L (ref 19–32)
Calcium: 9.4 mg/dL (ref 8.4–10.5)
Chloride: 105 mEq/L (ref 96–112)
Creatinine, Ser: 1.17 mg/dL (ref 0.40–1.50)
GFR: 63.78 mL/min (ref >60.00)
Glucose, Bld: 106 mg/dL — ABNORMAL HIGH (ref 70–99)
Potassium: 4.4 mEq/L (ref 3.5–5.1)
Sodium: 139 mEq/L (ref 135–145)
Total Bilirubin: 1.1 mg/dL (ref 0.2–1.2)
Total Protein: 6.7 g/dL (ref 6.0–8.3)

## 2022-01-25 LAB — LIPID PANEL
Cholesterol: 151 mg/dL (ref 0–200)
HDL: 50.1 mg/dL (ref >39.00)
LDL Cholesterol: 69 mg/dL (ref 0–99)
NonHDL: 100.89
Total CHOL/HDL Ratio: 3
Triglycerides: 157 mg/dL — ABNORMAL HIGH (ref 0.0–149.0)
VLDL: 31.4 mg/dL (ref 0.0–40.0)

## 2022-01-25 LAB — CBC WITH DIFFERENTIAL/PLATELET
Basophils Absolute: 0.1 10*3/uL (ref 0.0–0.1)
Basophils Relative: 1.1 % (ref 0.0–3.0)
Eosinophils Absolute: 0.3 10*3/uL (ref 0.0–0.7)
Eosinophils Relative: 5.5 % — ABNORMAL HIGH (ref 0.0–5.0)
HCT: 45.3 % (ref 39.0–52.0)
Hemoglobin: 15.9 g/dL (ref 13.0–17.0)
Lymphocytes Relative: 21.6 % (ref 12.0–46.0)
Lymphs Abs: 1.1 10*3/uL (ref 0.7–4.0)
MCHC: 35 g/dL (ref 30.0–36.0)
MCV: 88.4 fl (ref 78.0–100.0)
Monocytes Absolute: 0.6 10*3/uL (ref 0.1–1.0)
Monocytes Relative: 11.1 % (ref 3.0–12.0)
Neutro Abs: 3.2 10*3/uL (ref 1.4–7.7)
Neutrophils Relative %: 60.7 % (ref 43.0–77.0)
Platelets: 183 10*3/uL (ref 150.0–400.0)
RBC: 5.12 Mil/uL (ref 4.22–5.81)
RDW: 13.2 % (ref 11.5–15.5)
WBC: 5.3 10*3/uL (ref 4.0–10.5)

## 2022-01-25 LAB — VITAMIN B12: Vitamin B-12: 523 pg/mL (ref 211–911)

## 2022-01-25 NOTE — Patient Instructions (Addendum)
Health Maintenance Due  Topic Date Due   Zoster Vaccines- Shingrix (1 of 2)  Please check with your pharmacy to see if they have the shingrix vaccine. If they do- please get this immunization and update [place] by phone call or mychart with dates you receive the vaccine  Never done   COLONOSCOPY   [facility] contact Please call to schedule visit and/or procedure Address: [address], [place], [place] [address] Phone: [phone] 01/23/2022   Please stop by lab before you go If you have mychart- we will send your results within 3 business days of [place] receiving them.  If you do not have mychart- we will call you about results within 5 business days of [place] receiving them.  *please also note that you will see labs on mychart as soon as they post. I will later go in and write notes on them- will say "notes from Dr. Yong Channel"    Recommended follow up: Return in about 6 months (around 07/25/2022) for follow up- or sooner if needed.

## 2022-01-29 ENCOUNTER — Encounter: Payer: Self-pay | Admitting: Internal Medicine

## 2022-03-08 ENCOUNTER — Ambulatory Visit: Payer: Medicare Other | Admitting: Internal Medicine

## 2022-03-08 ENCOUNTER — Encounter: Payer: Self-pay | Admitting: Internal Medicine

## 2022-03-08 VITALS — BP 122/74 | HR 101 | Ht 71.0 in | Wt 250.4 lb

## 2022-03-08 DIAGNOSIS — R1310 Dysphagia, unspecified: Secondary | ICD-10-CM

## 2022-03-08 DIAGNOSIS — Z86010 Personal history of colonic polyps: Secondary | ICD-10-CM

## 2022-03-08 DIAGNOSIS — K219 Gastro-esophageal reflux disease without esophagitis: Secondary | ICD-10-CM | POA: Diagnosis not present

## 2022-03-08 DIAGNOSIS — Z800 Family history of malignant neoplasm of digestive organs: Secondary | ICD-10-CM | POA: Diagnosis not present

## 2022-03-08 MED ORDER — NA SULFATE-K SULFATE-MG SULF 17.5-3.13-1.6 GM/177ML PO SOLN: 1.0000 | ORAL | 0 refills | Status: DC

## 2022-03-08 NOTE — Progress Notes (Signed)
Patient ID: Randall Turner., male   DOB: 1951/09/21, 71 y.o.   MRN: [id number] ?HPI: ?Randall Turner is a 71 year old male with a personal history of adenomatous colon polyps, family history of colon cancer in his mother, GERD, hemochromatosis followed by hematology, prostate cancer in remission, hypertension and hyperlipidemia who is seen to discuss dysphagia and colonoscopic surveillance for history of polyps.  He is here alone today. ? ?His last colonoscopy was performed by me on 01/23/2017.  This revealed 2 polyps from the ascending colon which were removed.  Both less than a centimeter.  There were 3 lipomas seen in the colon which appeared unchanged.  Small internal hemorrhoids.  Polyps were adenomas. ? ?He reports that he has had years of solid food dysphagia.  To him it feels like food gets stuck high up above the sternal notch in his neck.  He feels like there is a "shelf" where food or large pills such as his fish oil tablet can get hung up.  He feels that if he moves the fish oil tablet to the right side of his throat it goes down more easily.  Solid food dysphagia typically occurs early in a meal.  It is sporadic.  He does take small bites and chews his food well.  Symptom is not new.  He is not having heartburn.  He does take daily omeprazole 20 mg.  No odynophagia.  No abdominal pain.  No change in bowel habit, blood in stool or melena.  He does report a history of childhood asthma as well as allergies to environmental antigens including cats.  Occasionally he feels like there is increased mucus and expectoration.  No dyspnea. ? ?He saw Dr. Benson Norway regarding this symptom years ago and recalls a barium esophagram which I cannot find in the EMR.  It sounds like EGD was attempted but unsuccessful due to coughing during attempted EGD. ? ? ?Past Medical History:  ?Diagnosis Date  ? Arthritis   ? Asthma   ? followed by pcp  ? Family history of adverse reaction to anesthesia   ? per pt his son age 88 w/ hand  surgery done approx late 1980s/early 1990s,  post op amnesia;  son has had surgery since with no issues  ? Fatty liver   ? followed by pcp-- per pt told by pcp blood work better  ? GERD (gastroesophageal reflux disease)   ? 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 followed by dr Marin Olp ([place])--  last phlebotomy 12/ 2016  ? Hiatal hernia   ? History of adenomatous polyp of colon   ? History of kidney stones   ? Hyperlipidemia   ? Hypertension   ? Internal hemorrhoids   ? Prostate cancer [facility]) urologist-- dr wrenn/ oncologist-- dr Tammi Klippel  ? dx 04-13-2019--- Stage T1c, Gleason 3+3  ? Wears glasses   ? ? ?Past Surgical History:  ?Procedure Laterality Date  ? BUNIONECTOMY Bilateral 2005  ? COLONOSCOPY  last one 2018  ? CYSTOSCOPY N/A 08/11/2019  ? Procedure: CYSTOSCOPY;  Surgeon: Irine Seal, MD;  Location: [facility];  Service: Urology;  Laterality: N/A;  ? PATELLECTOMY Right 1969  ? football injury   ? RADIOACTIVE SEED IMPLANT N/A 08/11/2019  ? Procedure: RADIOACTIVE SEED IMPLANT/BRACHYTHERAPY IMPLANT;  Surgeon: Irine Seal, MD;  Location[facility];  Service: Urology;  Laterality: N/A;  ? SPACE OAR INSTILLATION N/A 08/11/2019  ? Procedure: SPACE  OAR INSTILLATION;  Surgeon: Irine Seal, MD;  Location: [facility];  Service: Urology;  Laterality: N/A;  ? TONSILLECTOMY  1958  ? ? ?Outpatient Medications Prior to Visit  ?Medication Sig Dispense Refill  ? Flaxseed, Linseed, (FLAX SEED OIL) 1000 MG CAPS Take by mouth.    ? losartan (COZAAR) 50 MG tablet TAKE 1 TABLET BY MOUTH  DAILY 90 tablet 3  ? naproxen sodium (ALEVE) 220 MG tablet Take 220 mg by mouth 2 (two) times daily as needed.    ? Omega-3 Fatty Acids (OMEGA-3 FISH OIL PO) Take by mouth.    ? omeprazole (PRILOSEC) 20 MG capsule TAKE 1 CAPSULE BY MOUTH  ONCE DAILY 90 capsule 3  ? OVER THE COUNTER MEDICATION Take 1 tablet by mouth. Mens  multivitamin without iron    ? pravastatin (PRAVACHOL) 20 MG tablet TAKE 1 TABLET BY MOUTH  DAILY 90 tablet 3  ? PROAIR RESPICLICK 166 (90 Base) MCG/ACT AEPB Inhale 2 puffs by mouth into the lungs every 4 hours as needed for asthma. 1 each 3  ? VITAMIN D PO Take by mouth.    ? ?No facility-administered medications prior to visit.  ? ? ?Allergies  ?Allergen Reactions  ? Cortisone Other (See Comments)  ?  Topical cortisone caused swelling  ? ? ?Family History  ?Problem Relation Age of Onset  ? Gallstones Mother   ? Colon cancer Mother   ? Lung cancer Mother   ?     discovered near death  ? Heart disease Mother   ?     rhythm and mitral valve prolapse  ? Alzheimer's disease Father   ? Prostate cancer Father   ?     radiation when younger- survived  ? Mesothelioma Father   ? Heart disease Maternal Grandmother   ?     died at young age from heart issue unkown  ? Cancer Maternal Grandfather   ? Heart disease Paternal Grandmother   ?     enlarged heart  ? Heart attack Paternal Grandfather   ?     age 44  ? Nephrolithiasis Sister   ? Diabetes Maternal Aunt   ? Pancreatic cancer Neg Hx   ? Stomach cancer Neg Hx   ? ? ?Social History  ? ?Tobacco Use  ? Smoking status: Never  ? Smokeless tobacco: Never  ?Vaping Use  ? Vaping Use: Never used  ?Substance Use Topics  ? Alcohol use: No  ?  Alcohol/week: 0.0 standard drinks  ? Drug use: Never  ? ? ?ROS: ?As per history of present illness, otherwise negative ? ?BP 122/74 (BP Location: Left Arm, Patient Position: Sitting, Cuff Size: Normal)   Pulse (!) 101   Ht '5\' 11"'$  (1.803 m)   Wt 250 lb 6 oz (113.6 kg)   SpO2 96%   BMI 34.92 kg/m?  ?Gen: awake, alert, NAD ?HEENT: anicteric ?Neuro: nonfocal ? ?RELEVANT LABS AND IMAGING: ?CBC ?   ?Component Value Date/Time  ? WBC 5.3 01/25/2022 1136  ? RBC 5.12 01/25/2022 1136  ? HGB 15.9 01/25/2022 1136  ? HGB 15.6 06/14/2021 0906  ? HGB 15.9 06/11/2017 1401  ? HCT 45.3 01/25/2022 1136  ? HCT 46.8 06/11/2017 1401  ? PLT 183.0 01/25/2022 1136   ? PLT 188 06/14/2021 0906  ? PLT 231 06/11/2017 1401  ? MCV 88.4 01/25/2022 1136  ? MCV 85.1 06/11/2017 1401  ? MCH 31.3 06/14/2021 0906  ? MCHC 35.0 01/25/2022 1136  ? RDW 13.2 01/25/2022  1136  ? RDW 14.6 06/11/2017 1401  ? LYMPHSABS 1.1 01/25/2022 1136  ? LYMPHSABS 1.8 06/11/2017 1401  ? MONOABS 0.6 01/25/2022 1136  ? MONOABS 0.9 06/11/2017 1401  ? EOSABS 0.3 01/25/2022 1136  ? EOSABS 0.3 06/11/2017 1401  ? EOSABS 0.3 09/13/2016 1354  ? BASOSABS 0.1 01/25/2022 1136  ? BASOSABS 0.1 06/11/2017 1401  ? ? ?CMP  ?   ?Component Value Date/Time  ? NA 139 01/25/2022 1136  ? NA 141 06/11/2017 1401  ? K 4.4 01/25/2022 1136  ? K 4.3 06/11/2017 1401  ? CL 105 01/25/2022 1136  ? CO2 26 01/25/2022 1136  ? CO2 25 06/11/2017 1401  ? GLUCOSE 106 (H) 01/25/2022 1136  ? GLUCOSE 96 06/11/2017 1401  ? BUN 20 01/25/2022 1136  ? BUN 18.8 06/11/2017 1401  ? CREATININE 1.17 01/25/2022 1136  ? CREATININE 1.07 06/14/2021 0906  ? CREATININE 1.2 06/11/2017 1401  ? CALCIUM 9.4 01/25/2022 1136  ? CALCIUM 10.1 06/11/2017 1401  ? PROT 6.7 01/25/2022 1136  ? PROT 7.0 06/11/2017 1401  ? ALBUMIN 4.6 01/25/2022 1136  ? ALBUMIN 4.2 06/11/2017 1401  ? AST 39 (H) 01/25/2022 1136  ? AST 23 06/14/2021 0906  ? AST 32 06/11/2017 1401  ? ALT 62 (H) 01/25/2022 1136  ? ALT 37 06/14/2021 0906  ? ALT 53 06/11/2017 1401  ? ALKPHOS 91 01/25/2022 1136  ? ALKPHOS 122 06/11/2017 1401  ? BILITOT 1.1 01/25/2022 1136  ? BILITOT 1.1 06/14/2021 0906  ? BILITOT 0.89 06/11/2017 1401  ? GFRNONAA >60 06/14/2021 0906  ? GFRNONAA 63 09/15/2015 1009  ? GFRAA >60 06/15/2020 1049  ? GFRAA 72 09/15/2015 1009  ? ?Iron/TIBC/Ferritin/ %Sat ?   ?Component Value Date/Time  ? IRON 131 06/14/2021 0905  ? IRON 99 06/11/2017 1401  ? TIBC 324 06/14/2021 0905  ? TIBC 395 06/11/2017 1401  ? FERRITIN 36.5 01/25/2022 1136  ? FERRITIN 20 (L) 06/11/2017 1401  ? IRONPCTSAT 40 06/14/2021 0905  ? IRONPCTSAT 33 01/16/2019 1114  ? ? ?ASSESSMENT/PLAN: ?71 year old male with a personal history of  adenomatous colon polyps, family history of colon cancer in his mother, GERD, hemochromatosis followed by hematology, prostate cancer in remission, hypertension and hyperlipidemia who is seen to discuss dysphagia and col

## 2022-03-08 NOTE — Patient Instructions (Addendum)
You have been scheduled for an endoscopy and colonoscopy. Please follow the written instructions given to you at your visit today. ?Please pick up your prep supplies at the pharmacy within the next 1-3 days. ?If you use inhalers (even only as needed), please bring them with you on the day of your procedure. ? ?We have sent the following medications to your pharmacy for you to pick up at your convenience: ?[place] ? ?You have been scheduled for a Barium Esophogram at [facility] (1st floor of the hospital) on 03/20/22 at 10:30am. Please arrive 15 minutes prior to your appointment for registration. Make certain not to have anything to eat or drink 3 hours prior to your test. If you need to reschedule for any reason, please contact radiology at [phone] to do so. ?__________________________________________________________________ ?A barium swallow is an examination that concentrates on views of the esophagus. This tends to be a double contrast exam (barium and two liquids which, when combined, create a gas to distend the wall of the oesophagus) or single contrast (non-ionic iodine based). The study is usually tailored to your symptoms so a good history is essential. Attention is paid during the study to the form, structure and configuration of the esophagus, looking for functional disorders (such as aspiration, dysphagia, achalasia, motility and reflux) ?EXAMINATION ?You may be asked to change into a gown, depending on the type of swallow being performed. A radiologist and radiographer will perform the procedure. The radiologist will advise you of the ?type of contrast selected for your procedure and direct you during the exam. You will be asked to stand, sit or lie in several different positions and to hold a small amount of fluid in your mouth before being asked to swallow while the imaging is performed .In some instances you may be asked to swallow barium coated marshmallows to assess the motility of a  solid food bolus. ?The exam can be recorded as a digital or video fluoroscopy procedure. ?POST PROCEDURE ?It will take 1-2 days for the barium to pass through your system. To facilitate this, it is important, unless otherwise directed, to increase your fluids for the next 24-48hrs and to resume your normal diet.  ?This test typically takes about 30 minutes to perform. ?_____________________________________________________________________ ? ?Thank you for choosing me and [facility]. ? ? ? ?

## 2022-03-19 DIAGNOSIS — Z87442 Personal history of urinary calculi: Secondary | ICD-10-CM | POA: Diagnosis not present

## 2022-03-20 ENCOUNTER — Other Ambulatory Visit: Payer: Self-pay | Admitting: Internal Medicine

## 2022-03-20 ENCOUNTER — Ambulatory Visit (HOSPITAL_COMMUNITY)
Admission: RE | Admit: 2022-03-20 | Discharge: 2022-03-20 | Disposition: A | Discharge status: Home / Self Care | Payer: Medicare Other | Source: Ambulatory Visit | Attending: Internal Medicine | Admitting: Internal Medicine

## 2022-03-20 DIAGNOSIS — Z86010 Personal history of colonic polyps: Secondary | ICD-10-CM

## 2022-03-20 DIAGNOSIS — R1310 Dysphagia, unspecified: Secondary | ICD-10-CM | POA: Insufficient documentation

## 2022-03-20 DIAGNOSIS — K225 Diverticulum of esophagus, acquired: Secondary | ICD-10-CM | POA: Diagnosis not present

## 2022-03-22 ENCOUNTER — Telehealth: Payer: Self-pay | Admitting: Internal Medicine

## 2022-03-22 NOTE — Telephone Encounter (Signed)
Patient called states he just spoke with Dr. [name] regarding results and he has additional questions. Requested a call back. ?

## 2022-03-23 NOTE — Telephone Encounter (Signed)
I called the patient back: ?He wanted to know if he could stop omeprazole given that we have identified his issue as Zenker's diverticulum; he is not really having heartburn. ?I advised that he should attempt to be off omeprazole but monitor for any heartburn or indigestion symptoms that recur.  He can either stop this completely and use Pepcid 20 mg twice daily as needed for a few weeks or change omeprazole to 20 mg every other day for 2 weeks and then stop.  We did discuss that there is a small risk of rebound heartburn when coming off PPI. ? ?Also, the patient was scheduled for upper endoscopy which I recommended we cancel. ?He was also scheduled for colonoscopy which we should proceed with --this was inadvertently canceled by staff yesterday ? ?My schedule has an opening at 11 AM on the same day, 04/27/2022; he already has prep and instructions ?Please place him back on for colonoscopy on 04/27/2022 at 11 AM ?He is aware of the new arrival time of 10 AM ? ?Thanks ?JMP ? ?

## 2022-03-23 NOTE — Telephone Encounter (Signed)
Patient placed back on schedule for 04/27/22 at 11 am. ?

## 2022-04-16 ENCOUNTER — Ambulatory Visit: Payer: Medicare Other

## 2022-04-20 ENCOUNTER — Encounter: Payer: Self-pay | Admitting: Internal Medicine

## 2022-04-27 ENCOUNTER — Other Ambulatory Visit: Payer: Self-pay | Admitting: Internal Medicine

## 2022-04-27 ENCOUNTER — Encounter: Payer: Self-pay | Admitting: Internal Medicine

## 2022-04-27 ENCOUNTER — Encounter: Payer: Medicare Other | Admitting: Internal Medicine

## 2022-04-27 ENCOUNTER — Ambulatory Visit (AMBULATORY_SURGERY_CENTER): Payer: Medicare Other | Admitting: Internal Medicine

## 2022-04-27 VITALS — BP 110/74 | HR 80 | Temp 97.8°F | Resp 16 | Ht 71.0 in | Wt 250.0 lb

## 2022-04-27 DIAGNOSIS — Z86010 Personal history of colonic polyps: Secondary | ICD-10-CM | POA: Diagnosis not present

## 2022-04-27 DIAGNOSIS — D123 Benign neoplasm of transverse colon: Secondary | ICD-10-CM | POA: Diagnosis not present

## 2022-04-27 MED ORDER — SODIUM CHLORIDE 0.9 % IV SOLN: 500.0000 mL | Freq: Once | INTRAVENOUS | Status: DC

## 2022-04-27 NOTE — Patient Instructions (Signed)
? ?  Handouts on Hemorrhoids, Diverticulosis and Polyps given.  ? ?YOU HAD AN ENDOSCOPIC PROCEDURE TODAY AT [facility]:   Refer to the procedure report that was given to you for any specific questions about what was found during the examination.  If the procedure report does not answer your questions, please call your gastroenterologist to clarify.  If you requested that your care partner not be given the details of your procedure findings, then the procedure report has been included in a sealed envelope for you to review at your convenience later. ? ?YOU SHOULD EXPECT: Some feelings of bloating in the abdomen. Passage of more gas than usual.  Walking can help get rid of the air that was put into your GI tract during the procedure and reduce the bloating. If you had a lower endoscopy (such as a colonoscopy or flexible sigmoidoscopy) you may notice spotting of blood in your stool or on the toilet paper. If you underwent a bowel prep for your procedure, you may not have a normal bowel movement for a few days. ? ?Please Note:  You might notice some irritation and congestion in your nose or some drainage.  This is from the oxygen used during your procedure.  There is no need for concern and it should clear up in a day or so. ? ?SYMPTOMS TO REPORT IMMEDIATELY: ? ?Following lower endoscopy (colonoscopy or flexible sigmoidoscopy): ? Excessive amounts of blood in the stool ? Significant tenderness or worsening of abdominal pains ? Swelling of the abdomen that is new, acute ? Fever of 100?F or higher ? ? ?For urgent or emergent issues, a gastroenterologist can be reached at any hour by calling [phone]. ?Do not use MyChart messaging for urgent concerns.  ? ? ?DIET:  We do recommend a small meal at first, but then you may proceed to your regular diet.  Drink plenty of fluids but you should avoid alcoholic beverages for 24 hours. ? ?ACTIVITY:  You should plan to take it easy for the rest of today and you  should NOT DRIVE or use heavy machinery until tomorrow (because of the sedation medicines used during the test).   ? ?FOLLOW UP: ?Our staff will call the number listed on your records 48-72 hours following your procedure to check on you and address any questions or concerns that you may have regarding the information given to you following your procedure. If we do not reach you, we will leave a message.  We will attempt to reach you two times.  During this call, we will ask if you have developed any symptoms of COVID 19. If you develop any symptoms (ie: fever, flu-like symptoms, shortness of breath, cough etc.) before then, please call [phone].  If you test positive for Covid 19 in the 2 weeks post procedure, please call and report this information to [place].   ? ?If any biopsies were taken you will be contacted by phone or by letter within the next 1-3 weeks.  Please call us at [phone] if you have not heard about the biopsies in 3 weeks.  ? ? ?SIGNATURES/CONFIDENTIALITY: ?You and/or your care partner have signed paperwork which will be entered into your electronic medical record.  These signatures attest to the fact that that the information above on your After Visit Summary has been reviewed and is understood.  Full responsibility of the confidentiality of this discharge information lies with you and/or your care-partner.  ?

## 2022-04-27 NOTE — Op Note (Signed)
[place] ?Patient Name: Randall Turner ?Procedure Date: 04/27/2022 10:50 AM ?MRN: [id number] ?Endoscopist: Jerene Bears , MD ?Age: 71 ?Referring MD:  ?Date of Birth: 1951-02-08 ?Gender: Male ?Account #: [id number] ?Procedure:                Colonoscopy ?Indications:              High risk colon cancer surveillance: Personal  ?                          history of adenomas and SSP (2014 and 2018), Last  ?                          colonoscopy: January 2018 ?Medicines:                Monitored Anesthesia Care ?Procedure:                Pre-Anesthesia Assessment: ?                          - Prior to the procedure, a History and Physical  ?                          was performed, and patient medications and  ?                          allergies were reviewed. The patient's tolerance of  ?                          previous anesthesia was also reviewed. The risks  ?                          and benefits of the procedure and the sedation  ?                          options and risks were discussed with the patient.  ?                          All questions were answered, and informed consent  ?                          was obtained. Prior Anticoagulants: The patient has  ?                          taken no previous anticoagulant or antiplatelet  ?                          agents. ASA Grade Assessment: III - A patient with  ?                          severe systemic disease. After reviewing the risks  ?                          and benefits, the patient was deemed in  ?  satisfactory condition to undergo the procedure. ?                          After obtaining informed consent, the colonoscope  ?                          was passed under direct vision. Throughout the  ?                          procedure, the patient's blood pressure, pulse, and  ?                          oxygen saturations were monitored continuously. The  ?                          CF [id number] #[id number] was introduced  through the anus  ?                          and advanced to the cecum, identified by its  ?                          appearance. The colonoscopy was performed without  ?                          difficulty. The patient tolerated the procedure  ?                          well. The quality of the bowel preparation was  ?                          good. The ileocecal valve, appendiceal orifice, and  ?                          rectum were photographed. ?Scope In: 11:02:00 AM ?Scope Out: 11:26:19 AM ?Scope Withdrawal Time: 0 hours 20 minutes 23 seconds  ?Total Procedure Duration: 0 hours 24 minutes 19 seconds  ?Findings:                 A 4 mm polyp was found in the transverse colon. The  ?                          polyp was sessile. The polyp was removed with a  ?                          cold snare. Resection and retrieval were complete. ?                          Multiple small and large-mouthed diverticula were  ?                          found in the sigmoid colon and descending colon. ?                          There are several small lipomas in  the  ?                          sigmoid/rectosigmoid colon. Stable. ?                          Internal hemorrhoids were found during  ?                          retroflexion. The hemorrhoids were small. ?Complications:            No immediate complications. ?Estimated Blood Loss:     Estimated blood loss was minimal. ?Impression:               - One 4 mm polyp in the transverse colon, removed  ?                          with a cold snare. Resected and retrieved. ?                          - Mild diverticulosis in the sigmoid colon and in  ?                          the descending colon. ?                          - Small lipomas in the sigmoid colon. ?                          - Small internal hemorrhoids. ?Recommendation:           - Patient has a contact number available for  ?                          emergencies. The signs and symptoms of potential  ?                           delayed complications were discussed with the  ?                          patient. Return to normal activities tomorrow.  ?                          Written discharge instructions were provided to the  ?                          patient. ?                          - Resume previous diet. ?                          - Continue present medications. ?                          - Await pathology results. ?                          -  Repeat colonoscopy is recommended for  ?                          surveillance. The colonoscopy date will be  ?                          determined after pathology results from today's  ?                          exam become available for review. ?Jerene Bears, MD ?04/27/2022 11:31:24 AM ?This report has been signed electronically. ?

## 2022-04-27 NOTE — Progress Notes (Signed)
Called to room to assist during endoscopic procedure.  Patient ID and intended procedure confirmed with present staff. Received instructions for my participation in the procedure from the performing physician.  

## 2022-04-27 NOTE — Progress Notes (Signed)
Pt's states no medical or surgical changes since previsit or office visit. 

## 2022-04-27 NOTE — Progress Notes (Signed)
? ?GASTROENTEROLOGY PROCEDURE H&P NOTE  ? ?Primary Care Physician: ?Marin Olp, MD ? ? ? ?Reason for Procedure:  History of colon polyps ? ?Plan:    Colonoscopy ? ?Patient is appropriate for endoscopic procedure(s) in the ambulatory ([facility]) setting. ? ?The nature of the procedure, as well as the risks, benefits, and alternatives were carefully and thoroughly reviewed with the patient. Ample time for discussion and questions allowed. The patient understood, was satisfied, and agreed to proceed.  ? ? ? ?HPI: ?Randall Turner. is a 71 y.o. male who presents for colonoscopy.  Medical history as below.  Tolerated the prep.  No recent chest pain or shortness of breath.  No abdominal pain today. ? ?Past Medical History:  ?Diagnosis Date  ? Arthritis   ? Asthma   ? followed by pcp  ? Family history of adverse reaction to anesthesia   ? per pt his son age 42 w/ hand surgery done approx late 1980s/early 1990s,  post op amnesia;  son has had surgery since with no issues  ? Fatty liver   ? followed by pcp-- per pt told by pcp blood work better  ? GERD (gastroesophageal reflux disease)   ? 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 followed by dr Marin Olp ([place])--  last phlebotomy 12/ 2016  ? Hiatal hernia   ? History of adenomatous polyp of colon   ? History of kidney stones   ? Hyperlipidemia   ? Hypertension   ? Internal hemorrhoids   ? Prostate cancer [facility]) urologist-- dr wrenn/ oncologist-- dr Tammi Klippel  ? dx 04-13-2019--- Stage T1c, Gleason 3+3  ? Wears glasses   ? ? ?Past Surgical History:  ?Procedure Laterality Date  ? BUNIONECTOMY Bilateral 2005  ? COLONOSCOPY  last one 2018  ? CYSTOSCOPY N/A 08/11/2019  ? Procedure: CYSTOSCOPY;  Surgeon: Irine Seal, MD;  Location: [facility];  Service: Urology;  Laterality: N/A;  ? PATELLECTOMY Right 1969  ? football injury   ? RADIOACTIVE SEED IMPLANT N/A 08/11/2019  ? Procedure:  RADIOACTIVE SEED IMPLANT/BRACHYTHERAPY IMPLANT;  Surgeon: Irine Seal, MD;  Location: [facility];  Service: Urology;  Laterality: N/A;  ? SPACE OAR INSTILLATION N/A 08/11/2019  ? Procedure: SPACE OAR INSTILLATION;  Surgeon: Irine Seal, MD;  Location: [facility];  Service: Urology;  Laterality: N/A;  ? TONSILLECTOMY  1958  ? ? ?Prior to Admission medications   ?Medication Sig Start Date End Date Taking? Authorizing Provider  ?Flaxseed, Linseed, (FLAX SEED OIL) 1000 MG CAPS Take by mouth.   Yes [provider]  ?losartan (COZAAR) 50 MG tablet TAKE 1 TABLET BY MOUTH  DAILY 06/21/21  Yes Marin Olp, MD  ?naproxen sodium (ALEVE) 220 MG tablet Take 220 mg by mouth 2 (two) times daily as needed.   Yes [provider]  ?Omega-3 Fatty Acids (OMEGA-3 FISH OIL PO) Take by mouth.   Yes [provider]  ?OVER THE COUNTER MEDICATION Take 1 tablet by mouth. Mens multivitamin without iron   Yes [provider]  ?pravastatin (PRAVACHOL) 20 MG tablet TAKE 1 TABLET BY MOUTH  DAILY 10/30/21  Yes Marin Olp, MD  ?[facility] RESPICLICK 585 [phone] Base) MCG/ACT AEPB Inhale 2 puffs by mouth into the lungs every 4 hours as needed for asthma. 12/20/21  Yes Marin Olp, MD  ?omeprazole (PRILOSEC) 20 MG capsule TAKE 1 CAPSULE BY MOUTH  ONCE DAILY  06/15/21   Marin Olp, MD  ?VITAMIN D PO Take by mouth.    [provider]  ? ? ?Current Outpatient Medications  ?Medication Sig Dispense Refill  ? Flaxseed, Linseed, (FLAX SEED OIL) 1000 MG CAPS Take by mouth.    ? losartan (COZAAR) 50 MG tablet TAKE 1 TABLET BY MOUTH  DAILY 90 tablet 3  ? naproxen sodium (ALEVE) 220 MG tablet Take 220 mg by mouth 2 (two) times daily as needed.    ? Omega-3 Fatty Acids (OMEGA-3 FISH OIL PO) Take by mouth.    ? OVER THE COUNTER MEDICATION Take 1 tablet by mouth. Mens multivitamin without iron    ? pravastatin (PRAVACHOL) 20 MG tablet TAKE 1 TABLET BY MOUTH  DAILY 90 tablet 3   ? PROAIR RESPICLICK 656 (90 Base) MCG/ACT AEPB Inhale 2 puffs by mouth into the lungs every 4 hours as needed for asthma. 1 each 3  ? omeprazole (PRILOSEC) 20 MG capsule TAKE 1 CAPSULE BY MOUTH  ONCE DAILY 90 capsule 3  ? VITAMIN D PO Take by mouth.    ? ?Current Facility-Administered Medications  ?Medication Dose Route Frequency Provider Last Rate Last Admin  ? 0.9 %  sodium chloride infusion  500 mL Intravenous Once Tyleigh Mahn, Lajuan Lines, MD      ? ? ?Allergies as of 04/27/2022 - Review Complete 04/27/2022  ?Allergen Reaction Noted  ? Cortisone Other (See Comments) 07/22/2012  ? ? ?Family History  ?Problem Relation Age of Onset  ? Gallstones Mother   ? Colon cancer Mother   ? Lung cancer Mother   ?     discovered near death  ? Heart disease Mother   ?     rhythm and mitral valve prolapse  ? Alzheimer's disease Father   ? Prostate cancer Father   ?     radiation when younger- survived  ? Mesothelioma Father   ? Heart disease Maternal Grandmother   ?     died at young age from heart issue unkown  ? Cancer Maternal Grandfather   ? Heart disease Paternal Grandmother   ?     enlarged heart  ? Heart attack Paternal Grandfather   ?     age 72  ? Nephrolithiasis Sister   ? Diabetes Maternal Aunt   ? Pancreatic cancer Neg Hx   ? Stomach cancer Neg Hx   ? ? ?Social History  ? ?Socioeconomic History  ? Marital status: Married  ?  Spouse name: Not on file  ? Number of children: 2  ? Years of education: Not on file  ? Highest education level: Not on file  ?Occupational History  ?  Comment: retired since 2016  ?Tobacco Use  ? Smoking status: Never  ? Smokeless tobacco: Never  ?Vaping Use  ? Vaping Use: Never used  ?Substance and Sexual Activity  ? Alcohol use: No  ?  Alcohol/week: 0.0 standard drinks  ? Drug use: Never  ? Sexual activity: Not on file  ?  Comment: vasectomy  ?Other Topics Concern  ? Not on file  ?Social History Narrative  ? Married 1975. 2 sons. 4 grandkids- all local (21 to 10 in 2018)  ?   ? [name]  ? 2  years of college.   ?   ? Hobbies: enjoys watching football with wife [employer], movies, pool in back yard, grandkids.   ? ?Social Determinants of Health  ? ?Financial Resource Strain: Not on file  ?Food Insecurity: Not on  file  ?Transportation Needs: Not on file  ?Physical Activity: Not on file  ?Stress: Not on file  ?Social Connections: Not on file  ?Intimate Partner Violence: Not on file  ? ? ?Physical Exam: ?Vital signs in last 24 hours: ?'@BP'$  112/72   Pulse 86   Temp 97.8 ?F (36.6 ?C)   Ht '5\' 11"'$  (1.803 m)   Wt 250 lb (113.4 kg)   SpO2 96%   BMI 34.87 kg/m?  ?GEN: NAD ?EYE: Sclerae anicteric ?ENT: MMM ?CV: Non-tachycardic ?Pulm: CTA b/l ?GI: Soft, NT/ND ?NEURO:  Alert & Oriented x 3 ? ? ?Zenovia Jarred, MD ?[facility] ? ?04/27/2022 10:50 AM ? ?

## 2022-04-27 NOTE — Progress Notes (Signed)
VSS, transported to PACU °

## 2022-05-01 ENCOUNTER — Telehealth: Payer: Self-pay

## 2022-05-01 NOTE — Telephone Encounter (Signed)
Left message on follow up call. 

## 2022-05-01 NOTE — Telephone Encounter (Signed)
?  Follow up Call- ? ? ?  04/27/2022  ? 10:22 AM  ?Call back number  ?Post procedure Call Back phone  # [phone]  ?Permission to leave phone message Yes  ?  ? ?Patient questions: ? ?Do you have a fever, pain , or abdominal swelling? No. ?Pain Score  0 * ? ?Have you tolerated food without any problems? Yes.   ? ?Have you been able to return to your normal activities? Yes.   ? ?Do you have any questions about your discharge instructions: ?Diet   No. ?Medications  No. ?Follow up visit  No. ? ?Do you have questions or concerns about your Care? No. ? ?Actions: ?* If pain score is 4 or above: ?No action needed, pain <4. ? ? ?

## 2022-05-02 ENCOUNTER — Encounter: Payer: Self-pay | Admitting: Internal Medicine

## 2022-05-20 ENCOUNTER — Other Ambulatory Visit: Payer: Self-pay | Admitting: Family Medicine

## 2022-05-20 DIAGNOSIS — K219 Gastro-esophageal reflux disease without esophagitis: Secondary | ICD-10-CM

## 2022-05-30 DIAGNOSIS — K225 Diverticulum of esophagus, acquired: Secondary | ICD-10-CM | POA: Diagnosis not present

## 2022-06-27 ENCOUNTER — Other Ambulatory Visit: Payer: Self-pay | Admitting: Family Medicine

## 2022-07-26 ENCOUNTER — Other Ambulatory Visit: Payer: Self-pay | Admitting: Family Medicine

## 2022-07-27 ENCOUNTER — Ambulatory Visit (INDEPENDENT_AMBULATORY_CARE_PROVIDER_SITE_OTHER): Payer: Medicare Other | Admitting: Family Medicine

## 2022-07-27 ENCOUNTER — Encounter: Payer: Self-pay | Admitting: Family Medicine

## 2022-07-27 DIAGNOSIS — I10 Essential (primary) hypertension: Secondary | ICD-10-CM

## 2022-07-27 DIAGNOSIS — C61 Malignant neoplasm of prostate: Secondary | ICD-10-CM

## 2022-07-27 DIAGNOSIS — K225 Diverticulum of esophagus, acquired: Secondary | ICD-10-CM | POA: Diagnosis not present

## 2022-07-27 DIAGNOSIS — E785 Hyperlipidemia, unspecified: Secondary | ICD-10-CM | POA: Diagnosis not present

## 2022-07-27 NOTE — Progress Notes (Signed)
Phone [phone] In person visit   Subjective:   Randall Turner. is a 71 y.o. 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 ([place]) 11/30/2015    Priority: High   Elevated LFTs 07/17/2019    Priority: Medium    Asthma 08/09/2017    Priority: Medium    Essential hypertension 09/15/2015    Priority: Medium    Hyperglycemia 07/28/2013    Priority: Medium    Hyperlipidemia 07/28/2013    Priority: Medium    Esophageal reflux 07/28/2013    Priority: Medium    Closed fracture of fifth metatarsal bone of right foot 11/24/2018    Priority: Low   Right foot pain 11/24/2018    Priority: Low   History of adenomatous polyp of colon 08/09/2017    Priority: Low    Medications- reviewed and updated Current Outpatient Medications  Medication Sig Dispense Refill   Flaxseed, Linseed, (FLAX SEED OIL) 1000 MG CAPS Take by mouth.     losartan (COZAAR) 50 MG tablet TAKE 1 TABLET BY MOUTH  DAILY 90 tablet 3   Omega-3 Fatty Acids (OMEGA-3 FISH OIL PO) Take by mouth.     omeprazole (PRILOSEC) 20 MG capsule TAKE 1 CAPSULE BY MOUTH  ONCE DAILY 90 capsule 3   OVER THE COUNTER MEDICATION Take 1 tablet by mouth. Mens multivitamin without iron     pravastatin (PRAVACHOL) 20 MG tablet TAKE 1 TABLET BY MOUTH DAILY 100 tablet 2   PROAIR RESPICLICK 798 (90 Base) MCG/ACT AEPB Inhale 2 puffs by mouth into the lungs every 4 hours as needed for asthma. 1 each 2   VITAMIN D PO Take by mouth.     naproxen sodium (ALEVE) 220 MG tablet Take 220 mg by mouth 2 (two) times daily as needed.     No current facility-administered medications for this visit.      Objective:  BP 118/78   Pulse 72   Temp 98.1 F (36.7 C)   Ht '5\' 11"'$  (1.803 m)   Wt 242 lb 6.4 oz (110 kg)   SpO2 96%   BMI 33.81 kg/m  Gen: NAD, resting comfortably CV: RRR no murmurs rubs or gallops Lungs: CTAB no crackles, wheeze, rhonchi Ext: no edema Skin: warm, dry    Assessment and Plan   #Zenker's diverticulum- upcoming surgery with [facility] on September 14th. Able to complete 4 mets without chest pain or shortness of breath- would not need further cardiac workup . Trialed off Omeprazole but worsened symptoms- he is back on    #Hemochromatosis-prior followed with [name], now with PCP only S: Ferritin goal under 100.  Has had mild LFT elevation in the past-monitoring as long as remains under 100. Lab Results  Component Value Date   FERRITIN 36.5 01/25/2022  A/P: doing well- wants to wait on next visit to recheck ferritin     #Hypertension S: Controlled on losartan 50 mg BP Readings from Last 3 Encounters:  07/27/22 118/78  04/27/22 110/74  03/08/22 122/74  A/P: Controlled. Continue current medications.     #Hyperlipidemia S: Compliant with pravastatin 20 mg.  Lab Results  Component Value Date  CHOL 151 01/25/2022   HDL 50.10 01/25/2022   LDLCALC 69 01/25/2022   LDLDIRECT 87.0 01/16/2019   TRIG 157.0 (H) 01/25/2022   CHOLHDL 3 01/25/2022  A/P: excellent control- continue current meds     #Hyperglycemia S: CBGs high in the past.  A1c not elevated. Down 7 lbs from last visit- some of this could be diet for his procedure though Lab Results  Component Value Date   HGBA1C 5.5 01/16/2019  A/P: check fasting cbg next visit    #Asthma S: No controller.  Sparingly uses albuterol- if gets into coughing spells A/P: Controlled. Continue current medications.  -interested to see if this improves after zenkers surgery    #prostate cancer history- history of radioactive seed implants- he reports reassuring psa levels . Trending down 03/21/22 visit per notes with  DR. Wrenn Lab Results  Component Value Date   PSA 0.94 09/13/2021   PSA 2.76 11/09/2019   PSA 3.47 04/07/2019   #History of precancerous colon polyp-noted in May 2023 by Dr. [name] with 5-year repeat planned. He is also the one who discovered zenker's diverticulum   Recommended follow up: Return in about 6 months (around 01/27/2023) for physical or sooner if needed.Schedule b4 you leave.   Lab/Order associations: No diagnosis found.  No orders of the defined types were placed in this encounter.   Return precautions advised.  Garret Reddish, MD

## 2022-07-27 NOTE — Patient Instructions (Addendum)
Flu shot- we should have these available within a month or two but please let us know if you get at outside pharmacy  Blood work next visit  Recommended follow up: Return in about 6 months (around 01/27/2023) for physical or sooner if needed.Schedule b4 you leave.

## 2022-08-02 DIAGNOSIS — K219 Gastro-esophageal reflux disease without esophagitis: Secondary | ICD-10-CM | POA: Diagnosis not present

## 2022-08-02 DIAGNOSIS — J45909 Unspecified asthma, uncomplicated: Secondary | ICD-10-CM | POA: Diagnosis not present

## 2022-08-02 DIAGNOSIS — Z888 Allergy status to other drugs, medicaments and biological substances status: Secondary | ICD-10-CM | POA: Diagnosis not present

## 2022-08-02 DIAGNOSIS — Z79899 Other long term (current) drug therapy: Secondary | ICD-10-CM | POA: Diagnosis not present

## 2022-08-02 DIAGNOSIS — K225 Diverticulum of esophagus, acquired: Secondary | ICD-10-CM | POA: Diagnosis not present

## 2022-08-02 LAB — HM COLONOSCOPY

## 2022-09-17 ENCOUNTER — Encounter: Payer: Self-pay | Admitting: *Deleted

## 2022-09-18 ENCOUNTER — Ambulatory Visit (INDEPENDENT_AMBULATORY_CARE_PROVIDER_SITE_OTHER): Payer: Medicare Other

## 2022-09-18 DIAGNOSIS — Z23 Encounter for immunization: Secondary | ICD-10-CM

## 2022-09-26 DIAGNOSIS — K225 Diverticulum of esophagus, acquired: Secondary | ICD-10-CM | POA: Diagnosis not present

## 2023-01-28 ENCOUNTER — Ambulatory Visit (INDEPENDENT_AMBULATORY_CARE_PROVIDER_SITE_OTHER): Payer: Medicare Other | Admitting: Family Medicine

## 2023-01-28 ENCOUNTER — Encounter: Payer: Self-pay | Admitting: Family Medicine

## 2023-01-28 VITALS — BP 122/78 | HR 87 | Temp 97.6°F | Ht 71.0 in | Wt 237.4 lb

## 2023-01-28 DIAGNOSIS — R739 Hyperglycemia, unspecified: Secondary | ICD-10-CM

## 2023-01-28 DIAGNOSIS — I10 Essential (primary) hypertension: Secondary | ICD-10-CM | POA: Diagnosis not present

## 2023-01-28 DIAGNOSIS — C61 Malignant neoplasm of prostate: Secondary | ICD-10-CM

## 2023-01-28 DIAGNOSIS — E785 Hyperlipidemia, unspecified: Secondary | ICD-10-CM | POA: Diagnosis not present

## 2023-01-28 DIAGNOSIS — Z0000 Encounter for general adult medical examination without abnormal findings: Secondary | ICD-10-CM | POA: Diagnosis not present

## 2023-01-28 LAB — COMPREHENSIVE METABOLIC PANEL
ALT: 38 U/L (ref 0–53)
AST: 23 U/L (ref 0–37)
Albumin: 4 g/dL (ref 3.5–5.2)
Alkaline Phosphatase: 154 U/L — ABNORMAL HIGH (ref 39–117)
BUN: 15 mg/dL (ref 6–23)
CO2: 23 mEq/L (ref 19–32)
Calcium: 8.9 mg/dL (ref 8.4–10.5)
Chloride: 106 mEq/L (ref 96–112)
Creatinine, Ser: 1.01 mg/dL (ref 0.40–1.50)
GFR: 75.55 mL/min (ref >60.00)
Glucose, Bld: 105 mg/dL — ABNORMAL HIGH (ref 70–99)
Potassium: 4.4 mEq/L (ref 3.5–5.1)
Sodium: 139 mEq/L (ref 135–145)
Total Bilirubin: 0.8 mg/dL (ref 0.2–1.2)
Total Protein: 6.3 g/dL (ref 6.0–8.3)

## 2023-01-28 LAB — IBC + FERRITIN
Ferritin: 172.3 ng/mL (ref 22.0–322.0)
Iron: 86 ug/dL (ref 42–165)
Saturation Ratios: 31.8 % (ref 20.0–50.0)
TIBC: 270.2 ug/dL (ref 250.0–450.0)
Transferrin: 193 mg/dL — ABNORMAL LOW (ref 212.0–360.0)

## 2023-01-28 LAB — CBC WITH DIFFERENTIAL/PLATELET
Basophils Absolute: 0 10*3/uL (ref 0.0–0.1)
Basophils Relative: 0.8 % (ref 0.0–3.0)
Eosinophils Absolute: 0.5 10*3/uL (ref 0.0–0.7)
Eosinophils Relative: 8.3 % — ABNORMAL HIGH (ref 0.0–5.0)
HCT: 40.5 % (ref 39.0–52.0)
Hemoglobin: 14.2 g/dL (ref 13.0–17.0)
Lymphocytes Relative: 15.9 % (ref 12.0–46.0)
Lymphs Abs: 0.9 10*3/uL (ref 0.7–4.0)
MCHC: 35.1 g/dL (ref 30.0–36.0)
MCV: 88.6 fl (ref 78.0–100.0)
Monocytes Absolute: 0.6 10*3/uL (ref 0.1–1.0)
Monocytes Relative: 10.4 % (ref 3.0–12.0)
Neutro Abs: 3.5 10*3/uL (ref 1.4–7.7)
Neutrophils Relative %: 64.6 % (ref 43.0–77.0)
Platelets: 240 10*3/uL (ref 150.0–400.0)
RBC: 4.57 Mil/uL (ref 4.22–5.81)
RDW: 13.5 % (ref 11.5–15.5)
WBC: 5.5 10*3/uL (ref 4.0–10.5)

## 2023-01-28 LAB — LIPID PANEL
Cholesterol: 138 mg/dL (ref 0–200)
HDL: 44.2 mg/dL (ref >39.00)
LDL Cholesterol: 69 mg/dL (ref 0–99)
NonHDL: 93.87
Total CHOL/HDL Ratio: 3
Triglycerides: 123 mg/dL (ref 0.0–149.0)
VLDL: 24.6 mg/dL (ref 0.0–40.0)

## 2023-01-28 LAB — HEMOGLOBIN A1C: Hgb A1c MFr Bld: 5.5 % (ref 4.6–6.5)

## 2023-01-28 NOTE — Progress Notes (Signed)
Phone: [phone]   Subjective:  Patient presents today for their annual physical. Chief complaint-noted.   See problem oriented charting- ROS- full  review of systems was completed and negative  except for: didn't feel great after shingrix- felt run down, fever, some cough, runny nose, cough, some wheeze, eye itching and redness after being around sons cats, muscle aches. Not best rest helping wife after shoulder surgery.   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 [facility]    followed by dr Marin Olp ([place])--  last phlebotomy 12/ 2016   Hiatal hernia    History of adenomatous polyp of colon    History of kidney stones    Hyperlipidemia    Hypertension    Internal hemorrhoids    Prostate cancer [facility]) urologist-- dr wrenn/ oncologist-- dr Tammi Klippel   dx 04-13-2019--- Stage T1c, Gleason 3+3   Wears glasses    Patient Active Problem List   Diagnosis Date Noted   Zenker's diverticulum 07/27/2022    Priority: High   Malignant neoplasm of prostate ([place]) 05/05/2019    Priority: High   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 ([place]) 11/30/2015    Priority: High   Elevated LFTs 07/17/2019    Priority: Medium    Asthma 08/09/2017    Priority: Medium    Essential hypertension 09/15/2015    Priority: Medium    Hyperglycemia 07/28/2013    Priority: Medium    Hyperlipidemia 07/28/2013    Priority: Medium    Esophageal reflux 07/28/2013    Priority:  Medium    Closed fracture of fifth metatarsal bone of right foot 11/24/2018    Priority: Low   Right foot pain 11/24/2018    Priority: Low   History of adenomatous polyp of colon 08/09/2017    Priority: Low   Past Surgical History:  Procedure Laterality Date   BUNIONECTOMY Bilateral 2005   COLONOSCOPY  last one 2018   CYSTOSCOPY N/A 08/11/2019   Procedure: CYSTOSCOPY;  Surgeon: Irine Seal, MD;  Location: [facility];  Service: Urology;  Laterality: N/A;   PATELLECTOMY Right 1969   football injury    RADIOACTIVE SEED IMPLANT N/A 08/11/2019   Procedure: RADIOACTIVE SEED IMPLANT/BRACHYTHERAPY IMPLANT;  Surgeon: Irine Seal, MD;  Location: [facility];  Service: Urology;  Laterality: N/A;   SPACE OAR INSTILLATION N/A 08/11/2019   Procedure: SPACE OAR INSTILLATION;  Surgeon: Irine Seal, MD;  Location: [facility];  Service: Urology;  Laterality: N/A;   TONSILLECTOMY  1958    Family History  Problem Relation Age of Onset   Gallstones Mother    Colon cancer Mother    Lung cancer Mother        discovered near death   Heart disease Mother        rhythm and mitral valve prolapse   Alzheimer's disease Father    Prostate cancer Father  radiation when younger- survived   Mesothelioma Father    Heart disease Maternal Grandmother        died at young age from heart issue unkown   Cancer Maternal Grandfather    Heart disease Paternal Grandmother        enlarged heart   Heart attack Paternal Grandfather        age 30   Nephrolithiasis Sister    Diabetes Maternal Aunt    Pancreatic cancer Neg Hx    Stomach cancer Neg Hx     Medications- reviewed and updated Current Outpatient Medications  Medication Sig Dispense Refill   Flaxseed, Linseed, (FLAX SEED OIL) 1000 MG CAPS Take by mouth.     losartan (COZAAR) 50 MG tablet TAKE 1 TABLET BY MOUTH  DAILY 90 tablet 3   Omega-3 Fatty Acids (OMEGA-3 FISH OIL PO) Take by mouth.     omeprazole  (PRILOSEC) 20 MG capsule TAKE 1 CAPSULE BY MOUTH  ONCE DAILY 90 capsule 3   OVER THE COUNTER MEDICATION Take 1 tablet by mouth. Mens multivitamin without iron     pravastatin (PRAVACHOL) 20 MG tablet TAKE 1 TABLET BY MOUTH DAILY 100 tablet 2   PROAIR RESPICLICK 774 (90 Base) MCG/ACT AEPB Inhale 2 puffs by mouth into the lungs every 4 hours as needed for asthma. 1 each 2   VITAMIN D PO Take by mouth.     No current facility-administered medications for this visit.    Allergies-reviewed and updated Allergies  Allergen Reactions   Cortisone Other (See Comments)    Topical cortisone caused swelling    Social History   Social History Narrative   Married 1975. 2 sons. 4 grandkids- all local (21 to 10 in 2018)      [name]   2 years of college.       Hobbies: enjoys watching football with wife [employer], movies, pool in back yard, grandkids.    Objective  Objective:  BP 122/78   Pulse 87   Temp 97.6 F (36.4 C)   Ht '5\' 11"'$  (1.803 m)   Wt 237 lb 6.4 oz (107.7 kg)   SpO2 97%   BMI 33.11 kg/m  Gen: NAD, resting comfortably HEENT: Mucous membranes are moist. Oropharynx normal. Sclera and conjunctiva erythematous- cat exposure recently- likely allergic conjunctivitis Neck: no thyromegaly CV: RRR no murmurs rubs or gallops Lungs: CTAB no crackles, wheeze, rhonchi Abdomen: soft/nontender/nondistended/normal bowel sounds. No rebound or guarding.  Ext: no edema Skin: warm, dry Neuro: grossly normal, moves all extremities, PERRLA   Assessment and Plan  72 y.o. male presenting for annual physical.  Health Maintenance counseling: 1. Anticipatory guidance: Patient counseled regarding regular dental exams - in general q6 months, eye exams -yearly,  avoiding smoking and second hand smoke , limiting alcohol to 2 beverages per day - no alcohol, no illicit drugs .   2. Risk factor reduction:  Advised patient of need for regular exercise and diet rich and fruits and vegetables to reduce  risk of heart attack and stroke.  Exercise- a lot on plate lately - has not had time to exercise. .  Diet/weight management-Down 12 pounds from last physical!  Prior level of 227 on home scales-working towards that.  Intermittent fasting works well for him typically- still using.  He feels the fixed zenkers has been helpful plus was on intense diet for this that he thinks led to weight loss Wt Readings from Last 3 Encounters:  01/28/23 237 lb 6.4 oz (107.7  kg)  07/27/22 242 lb 6.4 oz (110 kg)  04/27/22 250 lb (113.4 kg)  3. Immunizations/screenings/ancillary studies-fully up-to-date other than RSV - opts out for now at pharmacy Immunization History  Administered Date(s) Administered   Fluad Quad(high Dose 65+) 09/15/2020, 09/18/2022   Influenza Split 10/30/2013   Influenza, High Dose Seasonal PF 09/11/2018, 08/26/2019   Influenza,inj,Quad PF,6+ Mos 08/03/2014, 09/15/2015, 09/13/2016   Influenza-Unspecified 11/11/2017   PFIZER Comirnaty(Gray Top)Covid-19 Tri-Sucrose Vaccine 09/18/2022   PFIZER(Purple Top)SARS-COV-2 Vaccination 02/13/2020, 03/08/2020, 10/11/2020   Pneumococcal Conjugate-13 01/03/2018   Pneumococcal Polysaccharide-23 01/16/2019   Pneumococcal-Unspecified 11/22/2006   Tdap 02/18/2007, 05/16/2017   Zoster Recombinat (Shingrix) 09/18/2022, 01/21/2023   Zoster, Live 12/06/2013   4. Prostate cancer screening- very mild PSA increase noted October visit with urology-plan was for 29-monthrepeat after prior treatment with radioactive seeds for prostate cancer.  Follows with Dr. WJeffie Pollock wants him to do the psa 5. Colon cancer screening - May 2023 with 5-year repeat planned with Dr. PHilarie Fredrickson6. Skin cancer screening- no dermatologist. advised regular sunscreen use. Denies worrisome, changing, or new skin lesions.  7. Smoking associated screening (lung cancer screening, AAA screen 65-75, UA)-never smoker 8. STD screening -only active with wife  Status of chronic or acute concerns     #Hemochromatosis-prior followed with Dr. EMarin Olp now with PCP only S: Ferritin goal under 100.  Has had mild LFT elevation in the past-monitoring as long as remains under 100. A/P: Hopefully stable-update ferritin level today   #Hypertension S: Controlled on losartan 50 mg   BP Readings from Last 3 Encounters:  01/28/23 122/78  07/27/22 118/78  04/27/22 110/74   A/P: Controlled. Continue current medications.   #Hyperlipidemia S: Compliant with pravastatin 20 mg  Lab Results  Component Value Date   CHOL 151 01/25/2022   HDL 50.10 01/25/2022   LDLCALC 69 01/25/2022   LDLDIRECT 87.0 01/16/2019   TRIG 157.0 (H) 01/25/2022   CHOLHDL 3 01/25/2022   A/P: Excellent control last check-update lipid panel today  #Hyperglycemia S: CBGs high in the past.  A1c not elevated. Lab Results  Component Value Date   HGBA1C 5.5 01/16/2019   HGBA1C 5.6 01/03/2018   HGBA1C 5.2 09/15/2015  A/P: CBGs elevated in the past but not A1c-offered A1c    #Asthma S: No controller.  Sparingly uses albuterol- other than with cat exposure A/P: stable- continue current medicines      #GERD # [facility] S: History of Schatzki's ring.  He is doing well with omeprazole 20 mg over-the-counter  Lab Results  Component Value Date   [id number] 24402/01/2022  A/P: reasonable control- continue current medications- recheck b12 next year    Recommended follow up: Return in about 6 months (around 07/29/2023) for followup or sooner if needed.Schedule b4 you leave.  Lab/Order associations: fasting   ICD-10-CM   1. Preventative health care  Z00.00     2. 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 Hyperglycemia  R73.9 HgB A1c    4. Essential hypertension  I10     5. Hyperlipidemia, unspecified hyperlipidemia type  E78.5 CBC with Differential/Platelet    Comprehensive metabolic panel     Lipid panel    6. Malignant neoplasm of prostate ([place] [facility] C61      No orders of the defined types were placed in this encounter.  Return precautions advised.  SGarret Reddish MD

## 2023-01-28 NOTE — Patient Instructions (Addendum)
Health Maintenance Due  Topic Date Due   Medicare Annual Wellness (AWV)  04/10/2022  You are eligible to schedule your annual wellness visit with our nurse specialist Otila Kluver.  Please consider scheduling this before you leave today  Please stop by lab before you go If you have mychart- we will send your results within 3 business days of [place] receiving them.  If you do not have mychart- we will call you about results within 5 business days of [place] receiving them.  *please also note that you will see labs on mychart as soon as they post. I will later go in and write notes on them- will say "notes from Dr. Yong Channel"   Recommended follow up: Return in about 6 months (around 07/29/2023) for followup or sooner if needed.Schedule b4 you leave.

## 2023-02-15 ENCOUNTER — Other Ambulatory Visit: Payer: Self-pay | Admitting: Family

## 2023-02-15 ENCOUNTER — Inpatient Hospital Stay: Payer: Medicare Other

## 2023-02-15 ENCOUNTER — Inpatient Hospital Stay: Payer: Medicare Other | Admitting: Family

## 2023-02-15 ENCOUNTER — Encounter: Payer: Self-pay | Admitting: Family

## 2023-02-15 ENCOUNTER — Inpatient Hospital Stay: Payer: Medicare Other | Attending: Hematology & Oncology

## 2023-02-15 LAB — CMP (CANCER CENTER ONLY)
ALT: 25 U/L (ref 0–44)
AST: 21 U/L (ref 15–41)
Albumin: 4.7 g/dL (ref 3.5–5.0)
Alkaline Phosphatase: 95 U/L (ref 38–126)
Anion gap: 8 (ref 5–15)
BUN: 17 mg/dL (ref 8–23)
CO2: 26 mmol/L (ref 22–32)
Calcium: 9.7 mg/dL (ref 8.9–10.3)
Chloride: 104 mmol/L (ref 98–111)
Creatinine: 1.07 mg/dL (ref 0.61–1.24)
GFR, Estimated: >60 mL/min (ref >60)
Glucose, Bld: 141 mg/dL — ABNORMAL HIGH (ref 70–99)
Potassium: 4.2 mmol/L (ref 3.5–5.1)
Sodium: 138 mmol/L (ref 135–145)
Total Bilirubin: 1 mg/dL (ref 0.3–1.2)
Total Protein: 6.8 g/dL (ref 6.5–8.1)

## 2023-02-15 LAB — CBC WITH DIFFERENTIAL (CANCER CENTER ONLY)
Abs Immature Granulocytes: 0.01 10*3/uL (ref 0.00–0.07)
Basophils Absolute: 0.1 10*3/uL (ref 0.0–0.1)
Basophils Relative: 1 %
Eosinophils Absolute: 0.3 10*3/uL (ref 0.0–0.5)
Eosinophils Relative: 5 %
HCT: 45.9 % (ref 39.0–52.0)
Hemoglobin: 16 g/dL (ref 13.0–17.0)
Immature Granulocytes: 0 %
Lymphocytes Relative: 25 %
Lymphs Abs: 1.2 10*3/uL (ref 0.7–4.0)
MCH: 30.9 pg (ref 26.0–34.0)
MCHC: 34.9 g/dL (ref 30.0–36.0)
MCV: 88.8 fL (ref 80.0–100.0)
Monocytes Absolute: 0.5 10*3/uL (ref 0.1–1.0)
Monocytes Relative: 10 %
Neutro Abs: 2.8 10*3/uL (ref 1.7–7.7)
Neutrophils Relative %: 59 %
Platelet Count: 246 10*3/uL (ref 150–400)
RBC: 5.17 MIL/uL (ref 4.22–5.81)
RDW: 13.1 % (ref 11.5–15.5)
WBC Count: 4.8 10*3/uL (ref 4.0–10.5)
nRBC: 0 % (ref 0.0–0.2)

## 2023-02-15 NOTE — Progress Notes (Signed)
Randall Turner. presents today for phlebotomy per MD orders.  Phlebotomy procedure started at 11:10 am and ended at 11:20  500 grams removed. 18G needle placed to left AC. Patient observed for 30 minutes after procedure without any incident. Patient tolerated procedure well. IV needle removed intact. VSS. Patient stayed post observation and had drinks and snacks.

## 2023-02-15 NOTE — Progress Notes (Signed)
Hematology and Oncology Follow Up Visit  Randall Turner [id number] 21-Nov-1951 72 y.o. 02/15/2023   Principle Diagnosis:  Hemochromatosis - compound heterozygote for C282Y and H63D History of prostate cancer - treated by urology   Current Therapy:        Phlebotomy to maintain ferritin less than 100 and iron saturation less than 50%.   Interim History:  Randall Turner is here today for follow-up and phlebotomy. Recent iron studies with PCP showed ferritin 172 and iron saturation 31%. He has no complaints at this time.  No fatigue or joint aches or pains.  No hot lalshes or night sweats.  No fever, chills, n/v, cough, rash, dizziness, SOB, chest pain, palpitations, abdominal pain or changes in bowel or bladder habits.  No swelling, tenderness, numbness or tingling in his extremities.  No falls or syncope.  Appetite and hydration are good. Weight is stable at 236 lbs.   ECOG Performance Status: 0 - Asymptomatic  Medications:  Allergies as of 02/15/2023       Reactions   Cortisone Other (See Comments)   Topical cortisone caused swelling        Medication List        Accurate as of February 15, 2023 10:29 AM. If you have any questions, ask your nurse or doctor.          Flax Seed Oil 1000 MG Caps Take by mouth.   losartan 50 MG tablet Commonly known as: COZAAR TAKE 1 TABLET BY MOUTH  DAILY   OMEGA-3 FISH OIL PO Take by mouth.   omeprazole 20 MG capsule Commonly known as: PRILOSEC TAKE 1 CAPSULE BY MOUTH  ONCE DAILY   OVER THE COUNTER MEDICATION Take 1 tablet by mouth. Mens multivitamin without iron   pravastatin 20 MG tablet Commonly known as: PRAVACHOL TAKE 1 TABLET BY MOUTH DAILY   ProAir RespiClick [id number] (90 Base) MCG/ACT Aepb Generic drug: Albuterol Sulfate Inhale 2 puffs by mouth into the lungs every 4 hours as needed for asthma.   VITAMIN D PO Take by mouth.        Allergies:  Allergies  Allergen Reactions   Cortisone Other (See  Comments)    Topical cortisone caused swelling    Past Medical History, Surgical history, Social history, and Family History were reviewed and updated.  Review of Systems: All other 10 point review of systems is negative.   Physical Exam:  vitals were not taken for this visit.   Wt Readings from Last 3 Encounters:  01/28/23 237 lb 6.4 oz (107.7 kg)  07/27/22 242 lb 6.4 oz (110 kg)  04/27/22 250 lb (113.4 kg)    Ocular: Sclerae unicteric, pupils equal, round and reactive to light Ear-nose-throat: Oropharynx clear, dentition fair Lymphatic: No cervical or supraclavicular adenopathy Lungs no rales or rhonchi, good excursion bilaterally Heart regular rate and rhythm, no murmur appreciated Abd soft, nontender, positive bowel sounds MSK no focal spinal tenderness, no joint edema Neuro: non-focal, well-oriented, appropriate affect Breasts: Deferred  Lab Results  Component Value Date   WBC 5.5 01/28/2023   HGB 14.2 01/28/2023   HCT 40.5 01/28/2023   MCV 88.6 01/28/2023   PLT 240.0 01/28/2023   Lab Results  Component Value Date   FERRITIN 172.3 01/28/2023   IRON 86 01/28/2023   TIBC 270.2 01/28/2023   UIBC 193 06/14/2021   IRONPCTSAT 31.8 01/28/2023   Lab Results  Component Value Date   RBC 4.57 01/28/2023   No results found for: "KPAFRELGTCHN", "LAMBDASER", "  KAPLAMBRATIO" No results found for: "IGGSERUM", "IGA", "IGMSERUM" No results found for: "TOTALPROTELP", "ALBUMINELP", "A1GS", "A2GS", "BETS", "BETA2SER", "GAMS", "MSPIKE", "SPEI"   Chemistry      Component Value Date/Time   NA 139 01/28/2023 1112   NA 141 06/11/2017 1401   K 4.4 01/28/2023 1112   K 4.3 06/11/2017 1401   CL 106 01/28/2023 1112   CO2 23 01/28/2023 1112   CO2 25 06/11/2017 1401   BUN 15 01/28/2023 1112   BUN 18.8 06/11/2017 1401   CREATININE 1.01 01/28/2023 1112   CREATININE 1.07 06/14/2021 0906   CREATININE 1.2 06/11/2017 1401      Component Value Date/Time   CALCIUM 8.9 01/28/2023 1112    CALCIUM 10.1 06/11/2017 1401   ALKPHOS 154 (H) 01/28/2023 1112   ALKPHOS 122 06/11/2017 1401   AST 23 01/28/2023 1112   AST 23 06/14/2021 0906   AST 32 06/11/2017 1401   ALT 38 01/28/2023 1112   ALT 37 06/14/2021 0906   ALT 53 06/11/2017 1401   BILITOT 0.8 01/28/2023 1112   BILITOT 1.1 06/14/2021 0906   BILITOT 0.89 06/11/2017 1401       Impression and Plan: Randall Turner is a 72 yo gentleman with hemochromatosis - compound heterozygous.  We will proceed with phlebotomy today as planned.  He will continue to have labs checked with his PCP and follow-up with our office as needed.   Lottie Dawson, NP 2/23/202410:29 AM

## 2023-02-15 NOTE — Patient Instructions (Signed)

## 2023-02-26 ENCOUNTER — Other Ambulatory Visit: Payer: Self-pay | Admitting: Family Medicine

## 2023-02-26 DIAGNOSIS — K219 Gastro-esophageal reflux disease without esophagitis: Secondary | ICD-10-CM

## 2023-04-04 ENCOUNTER — Other Ambulatory Visit (HOSPITAL_COMMUNITY): Payer: Self-pay

## 2023-04-04 ENCOUNTER — Telehealth: Payer: Self-pay | Admitting: Family Medicine

## 2023-04-04 ENCOUNTER — Telehealth: Payer: Self-pay

## 2023-04-04 ENCOUNTER — Encounter: Payer: Self-pay | Admitting: Family

## 2023-04-04 MED ORDER — VENTOLIN HFA 108 (90 BASE) MCG/ACT IN AERS: 2.0000 | INHALATION_SPRAY | Freq: Four times a day (QID) | RESPIRATORY_TRACT | 5 refills | Status: AC | PRN

## 2023-04-04 NOTE — Telephone Encounter (Signed)
Prescription Request  04/04/2023  LOV: 01/28/2023  What is the name of the medication or equipment? PROAIR RESPICLICK 108 (90 Base) MCG/ACT AEPB   Have you contacted your pharmacy to request a refill? No   Which pharmacy would you like this sent to?   [facility] - [place], [place] - [address] [place] [address] Phone: [phone] Fax: [phone]      Patient notified that their request is being sent to the clinical staff for review and that they should receive a response within 2 business days.   Please advise at [facility] [phone]

## 2023-04-04 NOTE — Telephone Encounter (Signed)
See below

## 2023-04-04 NOTE — Telephone Encounter (Signed)
PA request received via CMM for ProAir RespiClick 108 (90 Base)MCG/ACT aerosol powder  Pending if alternatives are appropriate.  Key: [id number]

## 2023-04-05 NOTE — Telephone Encounter (Signed)
Med not currently on med list, ok to refill?

## 2023-04-05 NOTE — Telephone Encounter (Signed)
I received a message from the prior Auth team that this was no longer covered but that Ventolin was not covered-I sent Ventolin in I believe yesterday

## 2023-04-25 ENCOUNTER — Other Ambulatory Visit (HOSPITAL_COMMUNITY): Payer: Self-pay

## 2023-04-25 NOTE — Telephone Encounter (Signed)
Ran test claim for Ventolin, received paid claim of $47.00

## 2023-04-25 NOTE — Telephone Encounter (Signed)
Called and lm on pt vm making him aware. 

## 2023-04-25 NOTE — Telephone Encounter (Signed)
Please let patient know that medicine will be 47 and he should be able to go pick it up

## 2023-04-25 NOTE — Telephone Encounter (Signed)
FYI

## 2023-05-07 ENCOUNTER — Other Ambulatory Visit: Payer: Self-pay | Admitting: Family Medicine

## 2023-07-29 ENCOUNTER — Ambulatory Visit (INDEPENDENT_AMBULATORY_CARE_PROVIDER_SITE_OTHER): Payer: Medicare Other | Admitting: Family Medicine

## 2023-07-29 ENCOUNTER — Encounter: Payer: Self-pay | Admitting: Family Medicine

## 2023-07-29 DIAGNOSIS — R748 Abnormal levels of other serum enzymes: Secondary | ICD-10-CM

## 2023-07-29 DIAGNOSIS — Z131 Encounter for screening for diabetes mellitus: Secondary | ICD-10-CM | POA: Diagnosis not present

## 2023-07-29 DIAGNOSIS — R739 Hyperglycemia, unspecified: Secondary | ICD-10-CM

## 2023-07-29 DIAGNOSIS — I10 Essential (primary) hypertension: Secondary | ICD-10-CM

## 2023-07-29 DIAGNOSIS — E785 Hyperlipidemia, unspecified: Secondary | ICD-10-CM

## 2023-07-29 LAB — CBC WITH DIFFERENTIAL/PLATELET
Basophils Absolute: 0.1 10*3/uL (ref 0.0–0.1)
Basophils Relative: 1 % (ref 0.0–3.0)
Eosinophils Absolute: 0.3 10*3/uL (ref 0.0–0.7)
Eosinophils Relative: 5.6 % — ABNORMAL HIGH (ref 0.0–5.0)
HCT: 46.7 % (ref 39.0–52.0)
Hemoglobin: 15.6 g/dL (ref 13.0–17.0)
Lymphocytes Relative: 25.6 % (ref 12.0–46.0)
Lymphs Abs: 1.4 10*3/uL (ref 0.7–4.0)
MCHC: 33.5 g/dL (ref 30.0–36.0)
MCV: 89.6 fl (ref 78.0–100.0)
Monocytes Absolute: 0.6 10*3/uL (ref 0.1–1.0)
Monocytes Relative: 10.6 % (ref 3.0–12.0)
Neutro Abs: 3.2 10*3/uL (ref 1.4–7.7)
Neutrophils Relative %: 57.2 % (ref 43.0–77.0)
Platelets: 212 10*3/uL (ref 150.0–400.0)
RBC: 5.21 Mil/uL (ref 4.22–5.81)
RDW: 13.9 % (ref 11.5–15.5)
WBC: 5.6 10*3/uL (ref 4.0–10.5)

## 2023-07-29 LAB — COMPREHENSIVE METABOLIC PANEL
ALT: 29 U/L (ref 0–53)
AST: 24 U/L (ref 0–37)
Albumin: 4.5 g/dL (ref 3.5–5.2)
Alkaline Phosphatase: 95 U/L (ref 39–117)
BUN: 18 mg/dL (ref 6–23)
CO2: 25 mEq/L (ref 19–32)
Calcium: 9.2 mg/dL (ref 8.4–10.5)
Chloride: 105 mEq/L (ref 96–112)
Creatinine, Ser: 1.15 mg/dL (ref 0.40–1.50)
GFR: 64.42 mL/min (ref >60.00)
Glucose, Bld: 107 mg/dL — ABNORMAL HIGH (ref 70–99)
Potassium: 4.2 mEq/L (ref 3.5–5.1)
Sodium: 140 mEq/L (ref 135–145)
Total Bilirubin: 1 mg/dL (ref 0.2–1.2)
Total Protein: 6.7 g/dL (ref 6.0–8.3)

## 2023-07-29 LAB — IBC + FERRITIN
Ferritin: 26.7 ng/mL (ref 22.0–322.0)
Iron: 130 ug/dL (ref 42–165)
Saturation Ratios: 36.4 % (ref 20.0–50.0)
TIBC: 357 ug/dL (ref 250.0–450.0)
Transferrin: 255 mg/dL (ref 212.0–360.0)

## 2023-07-29 LAB — HEMOGLOBIN A1C: Hgb A1c MFr Bld: 5.5 % (ref 4.6–6.5)

## 2023-07-29 NOTE — Progress Notes (Signed)
Phone [phone] In person visit   Subjective:   Randall Turner. is a 72 y.o. 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 (HCC) 11/30/2015    Priority: High   Elevated LFTs 07/17/2019    Priority: Medium    Asthma 08/09/2017    Priority: Medium    Essential hypertension 09/15/2015    Priority: Medium    Hyperglycemia 07/28/2013    Priority: Medium    Hyperlipidemia 07/28/2013    Priority: Medium    Esophageal reflux 07/28/2013    Priority: Medium    Closed fracture of fifth metatarsal bone of right foot 11/24/2018    Priority: Low   Right foot pain 11/24/2018    Priority: Low   History of adenomatous polyp of colon 08/09/2017    Priority: Low    Medications- reviewed and updated Current Outpatient Medications  Medication Sig Dispense Refill   Flaxseed, Linseed, (FLAX SEED OIL) 1000 MG CAPS Take by mouth.     losartan (COZAAR) 50 MG tablet TAKE 1 TABLET BY MOUTH DAILY 100 tablet 2   Omega-3 Fatty Acids (OMEGA-3 FISH OIL PO) Take by mouth.     omeprazole (PRILOSEC) 20 MG capsule TAKE 1 CAPSULE BY MOUTH ONCE  DAILY 100 capsule 2   OVER THE COUNTER MEDICATION Take 1 tablet by mouth. Mens multivitamin without iron     pravastatin (PRAVACHOL) 20 MG tablet TAKE 1 TABLET BY MOUTH DAILY 100 tablet 2   VENTOLIN HFA 108 (90 Base) MCG/ACT inhaler Inhale 2 puffs into the lungs every 6 (six) hours as needed for wheezing or shortness of breath. 18 g 5   VITAMIN D PO Take by mouth.     No current facility-administered medications for this visit.      Objective:  BP 124/82   Pulse 72   Temp (!) 97.2 F (36.2 C)   Ht 5\' 11"  (1.803 m)   Wt 245 lb 8 oz (111.4 kg)   SpO2 95%   BMI 34.24 kg/m  Gen: NAD, resting comfortably CV: RRR no murmurs rubs or gallops Lungs: CTAB no crackles, wheeze, rhonchi Ext: no edema Skin: warm, dry     Assessment and Plan   #Hemochromatosis-prior followed with Dr. Myna Hidalgo S: Ferritin goal under 100.  Has had mild LFT elevation in the past-monitoring as long as remains under 100. -We have been monitoring but ferritin increased at last visit and he returned to hematology-received therapeutic phlebotomy at that time Lab Results  Component Value Date   FERRITIN 172.3 01/28/2023  A/P: hemochromatosis hopefully at goal with Ferritin under 100- update ferritin and CBC as well as CMP today    #Hypertension S: Controlled on losartan 50 mg BP Readings from Last 3 Encounters:  07/29/23 124/82  02/15/23 121/79  02/15/23 135/82  A/P: reasonable control- continue current medications   - at goal per jnc8 and [employer] ([employer]) guidelines - he may also work on lifestyle to push weight down as well. Loosens up some more in summer on diet- wants to get back  down at least 5- lbs which is a great goal   #Hyperlipidemia S: Compliant with pravastatin 20 mg, fish oil Lab Results  Component Value Date   CHOL 138 01/28/2023   HDL 44.20 01/28/2023   LDLCALC 69 01/28/2023   LDLDIRECT 87.0 01/16/2019   TRIG 123.0 01/28/2023   CHOLHDL 3 01/28/2023  A/P: lipids well controlled- continue current medications - update next visit     #Hyperglycemia S: CBGs high in the past.  A1c not elevated. Lab Results  Component Value Date   HGBA1C 5.5 01/28/2023   HGBA1C 5.5 01/16/2019   HGBA1C 5.6 01/03/2018  A/P: hopefully stable- continue to monitor - check a1c today      #Asthma S: No controller.  Sparingly uses albuterol A/P: doing well- continue current medications      #GERD #  History of Zenker's diverticulum with surgery [facility] 2023 S: History of Schatzki's ring- remains on omeprazole 20 mg- tired to stop and didn't go well and restarted A/P: stable- continue current medicines     #prostate cancer history- history of radioactive seed implants- he reports reassuring psa levels with last urology visit 09/25/22 and plans for annual follow up    # Mildly elevated alkaline phosphatase -normal on repeat in february  #Mild plantar fasciitis- has dealth with before and plans to self treat. Triggered it stepping down on shovel  Recommended follow up: Return in about 6 months (around 01/29/2024) for physical or sooner if needed.Schedule b4 you leave.  Lab/Order associations:   ICD-10-CM   1. 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 Hyperlipidemia, unspecified hyperlipidemia type  E78.5 Comprehensive metabolic panel    CBC with Differential/Platelet    3. Hyperglycemia  R73.9 Hemoglobin A1c    4. Essential hypertension  I10 CBC with Differential/Platelet    5. Elevated alkaline phosphatase level  R74.8     6. Screening for diabetes mellitus  Z13.1 Hemoglobin A1c      No orders of the defined types were placed in this encounter.   Return precautions advised.  Tana Conch, MD

## 2023-07-29 NOTE — Patient Instructions (Addendum)
Let us know if you get a covid or flu vaccine this fall.  I agree with you on working on mild weight loss between now and next visit- you've had good success before and you can do this again!   Please stop by lab before you go If you have mychart- we will send your results within 3 business days of [place] receiving them.  If you do not have mychart- we will call you about results within 5 business days of [place] receiving them.  *please also note that you will see labs on mychart as soon as they post. I will later go in and write notes on them- will say "notes from Dr. Durene Cal"   Recommended follow up: Return in about 6 months (around 01/29/2024) for physical or sooner if needed.Schedule b4 you leave.

## 2023-09-05 ENCOUNTER — Ambulatory Visit: Payer: Medicare Other

## 2023-11-09 ENCOUNTER — Other Ambulatory Visit: Payer: Self-pay | Admitting: Family Medicine

## 2023-11-09 DIAGNOSIS — K219 Gastro-esophageal reflux disease without esophagitis: Secondary | ICD-10-CM

## 2024-01-18 ENCOUNTER — Other Ambulatory Visit: Payer: Self-pay | Admitting: Family Medicine

## 2024-02-06 ENCOUNTER — Encounter: Payer: Self-pay | Admitting: Family Medicine

## 2024-02-06 ENCOUNTER — Ambulatory Visit (INDEPENDENT_AMBULATORY_CARE_PROVIDER_SITE_OTHER): Payer: Medicare Other | Admitting: Family Medicine

## 2024-02-06 VITALS — BP 122/86 | HR 72 | Temp 97.8°F | Ht 71.0 in | Wt 249.8 lb

## 2024-02-06 DIAGNOSIS — Z0000 Encounter for general adult medical examination without abnormal findings: Secondary | ICD-10-CM

## 2024-02-06 DIAGNOSIS — Z131 Encounter for screening for diabetes mellitus: Secondary | ICD-10-CM | POA: Diagnosis not present

## 2024-02-06 DIAGNOSIS — R739 Hyperglycemia, unspecified: Secondary | ICD-10-CM

## 2024-02-06 DIAGNOSIS — I10 Essential (primary) hypertension: Secondary | ICD-10-CM | POA: Diagnosis not present

## 2024-02-06 DIAGNOSIS — E785 Hyperlipidemia, unspecified: Secondary | ICD-10-CM | POA: Diagnosis not present

## 2024-02-06 DIAGNOSIS — C61 Malignant neoplasm of prostate: Secondary | ICD-10-CM | POA: Diagnosis not present

## 2024-02-06 LAB — COMPREHENSIVE METABOLIC PANEL
ALT: 52 U/L (ref 0–53)
AST: 34 U/L (ref 0–37)
Albumin: 4.7 g/dL (ref 3.5–5.2)
Alkaline Phosphatase: 106 U/L (ref 39–117)
BUN: 16 mg/dL (ref 6–23)
CO2: 29 meq/L (ref 19–32)
Calcium: 9.5 mg/dL (ref 8.4–10.5)
Chloride: 105 meq/L (ref 96–112)
Creatinine, Ser: 1.09 mg/dL (ref 0.40–1.50)
GFR: 68.45 mL/min (ref >60.00)
Glucose, Bld: 119 mg/dL — ABNORMAL HIGH (ref 70–99)
Potassium: 4.5 meq/L (ref 3.5–5.1)
Sodium: 144 meq/L (ref 135–145)
Total Bilirubin: 1.3 mg/dL — ABNORMAL HIGH (ref 0.2–1.2)
Total Protein: 6.8 g/dL (ref 6.0–8.3)

## 2024-02-06 LAB — CBC WITH DIFFERENTIAL/PLATELET
Basophils Absolute: 0 10*3/uL (ref 0.0–0.1)
Basophils Relative: 0.6 % (ref 0.0–3.0)
Eosinophils Absolute: 0.3 10*3/uL (ref 0.0–0.7)
Eosinophils Relative: 5.6 % — ABNORMAL HIGH (ref 0.0–5.0)
HCT: 47.6 % (ref 39.0–52.0)
Hemoglobin: 16.7 g/dL (ref 13.0–17.0)
Lymphocytes Relative: 24.6 % (ref 12.0–46.0)
Lymphs Abs: 1.3 10*3/uL (ref 0.7–4.0)
MCHC: 35.2 g/dL (ref 30.0–36.0)
MCV: 90.3 fL (ref 78.0–100.0)
Monocytes Absolute: 0.6 10*3/uL (ref 0.1–1.0)
Monocytes Relative: 11.4 % (ref 3.0–12.0)
Neutro Abs: 3 10*3/uL (ref 1.4–7.7)
Neutrophils Relative %: 57.8 % (ref 43.0–77.0)
Platelets: 197 10*3/uL (ref 150.0–400.0)
RBC: 5.27 Mil/uL (ref 4.22–5.81)
RDW: 13.2 % (ref 11.5–15.5)
WBC: 5.2 10*3/uL (ref 4.0–10.5)

## 2024-02-06 LAB — LIPID PANEL
Cholesterol: 153 mg/dL (ref 0–200)
HDL: 50.5 mg/dL (ref >39.00)
LDL Cholesterol: 61 mg/dL (ref 0–99)
NonHDL: 102.62
Total CHOL/HDL Ratio: 3
Triglycerides: 209 mg/dL — ABNORMAL HIGH (ref 0.0–149.0)
VLDL: 41.8 mg/dL — ABNORMAL HIGH (ref 0.0–40.0)

## 2024-02-06 LAB — PSA, MEDICARE: PSA: 0.29 ng/mL (ref 0.10–4.00)

## 2024-02-06 LAB — IBC + FERRITIN
Ferritin: 24.5 ng/mL (ref 22.0–322.0)
Iron: 148 ug/dL (ref 42–165)
Saturation Ratios: 38.3 % (ref 20.0–50.0)
TIBC: 386.4 ug/dL (ref 250.0–450.0)
Transferrin: 276 mg/dL (ref 212.0–360.0)

## 2024-02-06 LAB — HEMOGLOBIN A1C: Hgb A1c MFr Bld: 5.5 % (ref 4.6–6.5)

## 2024-02-06 NOTE — Progress Notes (Signed)
Phone: [phone]   Subjective:  Patient presents today for their annual physical. Chief complaint-noted.   See problem oriented charting- ROS- full  review of systems was completed and negative  except for: shoulder pain- tweaked this but improving- was lifting ladder before thanksgiving- wants to do his own therapy at home as making progress  The following were reviewed and entered/updated in epic: Past Medical History:  Diagnosis Date   Allergy    Arthritis    Asthma    followed by pcp   Family history of adverse reaction to anesthesia    per pt his son age 12 w/ hand surgery done approx late 1980s/early 1990s,  post op amnesia;  son has had surgery since with no issues   Fatty liver    followed by pcp-- per pt told by pcp blood work better   GERD (gastroesophageal reflux disease)    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 [facility])    followed by dr Myna Hidalgo [facility]  last phlebotomy 12/ 2016   Hiatal hernia    History of adenomatous polyp of colon    History of kidney stones    Hyperlipidemia    Hypertension    Internal hemorrhoids    Prostate cancer [facility]) urologist-- dr wrenn/ oncologist-- dr Kathrynn Running   dx 04-13-2019--- Stage T1c, Gleason 3+3   Wears glasses    Patient Active Problem List   Diagnosis Date Noted   Zenker's diverticulum 07/27/2022    Priority: High   Malignant neoplasm of prostate (HCC) 05/05/2019    Priority: High   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 (HCC) 11/30/2015    Priority: High   Elevated LFTs 07/17/2019    Priority: Medium    Asthma 08/09/2017    Priority: Medium    Essential hypertension 09/15/2015    Priority: Medium    Hyperglycemia 07/28/2013    Priority: Medium    Hyperlipidemia 07/28/2013    Priority: Medium    Esophageal reflux 07/28/2013    Priority: Medium    Closed fracture of fifth metatarsal bone of right foot  11/24/2018    Priority: Low   Right foot pain 11/24/2018    Priority: Low   History of adenomatous polyp of colon 08/09/2017    Priority: Low   Past Surgical History:  Procedure Laterality Date   BUNIONECTOMY Bilateral 2005   COLONOSCOPY  last one 2018   CYSTOSCOPY N/A 08/11/2019   Procedure: CYSTOSCOPY;  Surgeon: Bjorn Pippin, MD;  Location: [facility];  Service: Urology;  Laterality: N/A;   PATELLECTOMY Right 1969   football injury    RADIOACTIVE SEED IMPLANT N/A 08/11/2019   Procedure: RADIOACTIVE SEED IMPLANT/BRACHYTHERAPY IMPLANT;  Surgeon: Bjorn Pippin, MD;  Location: [facility];  Service: Urology;  Laterality: N/A;   SPACE OAR INSTILLATION N/A 08/11/2019   Procedure: SPACE OAR INSTILLATION;  Surgeon: Bjorn Pippin, MD;  Location: [facility];  Service: Urology;  Laterality: N/A;   TONSILLECTOMY  1958    Family History  Problem Relation Age of Onset   Gallstones Mother    Colon cancer Mother    Lung cancer Mother        discovered near death   Heart disease Mother        rhythm and mitral valve prolapse   Alzheimer's disease Father    Prostate cancer Father        radiation when younger- survived   Mesothelioma  Father    Cancer Father    Heart disease Maternal Grandmother        died at young age from heart issue unkown   Cancer Maternal Grandfather    Heart disease Paternal Grandmother        enlarged heart   Heart attack Paternal Grandfather        age 74   Nephrolithiasis Sister    Diabetes Maternal Aunt    Pancreatic cancer Neg Hx    Stomach cancer Neg Hx     Medications- reviewed and updated Current Outpatient Medications  Medication Sig Dispense Refill   Flaxseed, Linseed, (FLAX SEED OIL) 1000 MG CAPS Take by mouth.     losartan (COZAAR) 50 MG tablet TAKE 1 TABLET BY MOUTH DAILY 100 tablet 2   naproxen (NAPROSYN) 250 MG tablet Take 250 mg by mouth as needed.     Omega-3 Fatty Acids (OMEGA-3 FISH OIL PO) Take by  mouth.     omeprazole (PRILOSEC) 20 MG capsule TAKE 1 CAPSULE BY MOUTH ONCE  DAILY 100 capsule 2   OVER THE COUNTER MEDICATION Take 1 tablet by mouth. Mens multivitamin without iron     pravastatin (PRAVACHOL) 20 MG tablet TAKE 1 TABLET BY MOUTH DAILY 100 tablet 2   VENTOLIN HFA 108 (90 Base) MCG/ACT inhaler Inhale 2 puffs into the lungs every 6 (six) hours as needed for wheezing or shortness of breath. 18 g 5   VITAMIN D PO Take by mouth.     No current facility-administered medications for this visit.    Allergies-reviewed and updated Allergies  Allergen Reactions   Cortisone Other (See Comments)    Topical cortisone caused swelling    Social History   Social History Narrative   Married 1975. 2 sons. 4 grandkids- all local (21 to 10 in 2018)      [employer]   2 years of college.       Hobbies: enjoys watching football with wife [employer], movies, pool in back yard, grandkids.    Objective  Objective:  BP 122/86   Pulse Randall   Temp 97.8 F (36.6 C)   Ht 5\' 11"  (1.803 m)   Wt 249 lb 12.8 oz (113.3 kg)   SpO2 98%   BMI 34.84 kg/m  Gen: NAD, resting comfortably HEENT: Mucous membranes are moist. Oropharynx normal Neck: no thyromegaly CV: RRR no murmurs rubs or gallops Lungs: CTAB no crackles, wheeze, rhonchi Abdomen: soft/nontender/nondistended/normal bowel sounds. No rebound or guarding.  Ext: no edema Skin: warm, dry Neuro: grossly normal, moves all extremities, PERRLA Declines rectal or genitourinary exam    Assessment and Plan  Randall Turner presenting for annual physical.  Health Maintenance counseling: 1. Anticipatory guidance: Patient counseled regarding regular dental exams -q6 months, eye exams -yearly,  avoiding smoking and second hand smoke , limiting alcohol to 2 beverages per day - doesn't drink, no illicit drugs .   2. Risk factor reduction:  Advised patient of need for regular exercise and diet rich and fruits and vegetables to reduce risk of heart  attack and stroke.  Exercise- has been hard with bad shoulder and prior bad knee- has pool and can be heated when weather gets better- has also biked in pool.  Diet/weight management-weight up 12 lbs from last physical after being down 12 last year-intermittent fasting in past helpful in past- having to cut portions now to try to help reduce.  Wt Readings from Last 3 Encounters:  02/06/24 249 lb 12.8  oz (113.3 kg)  07/29/23 245 lb 8 oz (111.4 kg)  02/15/23 236 lb (107 kg)  3. Immunizations/screenings/ancillary studies- up to date other than if gets COVID. Had flu shot in August - team to input Immunization History  Administered Date(s) Administered   Fluad Quad(high Dose 65+) 09/15/2020, 09/18/2022   Influenza Split 10/30/2013   Influenza, High Dose Seasonal PF 09/11/2018, 08/26/2019, 08/16/2023   Influenza,inj,Quad PF,6+ Mos 08/03/2014, 09/15/2015, 09/13/2016   Influenza-Unspecified 11/11/2017   PFIZER Comirnaty(Gray Top)Covid-19 Tri-Sucrose Vaccine 09/18/2022   PFIZER(Purple Top)SARS-COV-2 Vaccination 02/13/2020, 03/08/2020, 10/11/2020   Pneumococcal Conjugate-13 01/03/2018   Pneumococcal Polysaccharide-23 01/16/2019   Pneumococcal-Unspecified 11/22/2006   Tdap 02/18/2007, 05/16/2017   Zoster Recombinant(Shingrix) 09/18/2022, 01/21/2023   Zoster, Live 12/06/2013  4. Prostate cancer screening-  follow up with urology Dr. Annabell Howells with radioactive seeds for prostate cancer. Out to yearly follow up with #s trending down. Will get PSA her as well on his request  5. Colon cancer screening - may 2023 with 5 year repeat with Dr. Rhea Belton 6. Skin cancer screening- no dermatology visits.  advised regular sunscreen use. Denies worrisome, changing, or new skin lesions.  7. Smoking associated screening (lung cancer screening, AAA screen 65-75, UA)- never smoker 8. STD screening -  only active with wife  Status of chronic or acute concerns    #Hemochromatosis-prior followed with Dr. Myna Hidalgo, now with  PCP only S: Ferritin goal under 100.  Has had mild LFT elevation in the past-monitoring as long as remains under 100.therapeutic phlebotomy last year A/P: hopefully stable- update ferritin today. Continue current meds for now      #Hypertension S: Controlled on losartan 50 mg A/P: high acceptable on diastolic but systolic looks great- continue current medications      #Hyperlipidemia S: Compliant with pravastatin 20 mg, fish oil Lab Results  Component Value Date   CHOL 138 01/28/2023   HDL 44.20 01/28/2023   LDLCALC 69 01/28/2023   LDLDIRECT 87.0 01/16/2019   TRIG 123.0 01/28/2023   CHOLHDL 3 01/28/2023  A/P: hopefully stable- update lipid panel today. Continue current meds for now      #Hyperglycemia S: CBGs high in the past.  A1c not elevated. Lab Results  Component Value Date   HGBA1C 5.5 07/29/2023   HGBA1C 5.5 01/28/2023   HGBA1C 5.5 01/16/2019  A/P: a1c has been ok even if sugars high- continue to check today      #Asthma S: No controller.  Sparingly uses albuterol A/P: no recent issues- continue to monitor      #GERD- on scope was told no gastroesophageal reflux disease most recently but flare up off of medicine # History of Zenker's diverticulum with surgery at [facility] September 2023 S: History of Schatzki's ring. Takes omeprazole 20 mg A/P: reflux reasonably well controlled- continue to monitor   Recommended follow up: Return in about 6 months (around 08/05/2024) for followup or sooner if needed.Schedule b4 you leave.  Lab/Order associations: fasting   ICD-10-CM   1. Preventative health care  Z00.00     2. Screening for diabetes mellitus  Z13.1 Hemoglobin A1c    3. Malignant neoplasm of prostate (HCC) Chronic C61 PSA, Medicare    4. 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 Hyperlipidemia, unspecified hyperlipidemia type  E78.5 Comprehensive metabolic panel    Lipid panel    CBC with  Differential/Platelet    6. Hyperglycemia  R73.9 Hemoglobin A1c    7. Essential  hypertension  I10 Comprehensive metabolic panel    Lipid panel    CBC with Differential/Platelet      No orders of the defined types were placed in this encounter.   Return precautions advised.  Tana Conch, MD

## 2024-02-06 NOTE — Patient Instructions (Addendum)
You are eligible to schedule your annual wellness visit with our nurse specialist Randall Turner.  Please consider scheduling this before you leave today  Please stop by lab before you go If you have mychart- we will send your results within 3 business days of Randall Turner receiving them.  If you do not have mychart- we will call you about results within 5 business days of Randall Turner receiving them.  *please also note that you will see labs on mychart as soon as they post. I will later go in and write notes on them- will say "notes from Dr. Durene Cal"   Recommended follow up: Return in about 6 months (around 08/05/2024) for followup or sooner if needed.Schedule b4 you leave.

## 2024-07-18 ENCOUNTER — Other Ambulatory Visit: Payer: Self-pay | Admitting: Family Medicine

## 2024-07-18 DIAGNOSIS — K219 Gastro-esophageal reflux disease without esophagitis: Secondary | ICD-10-CM

## 2024-08-05 ENCOUNTER — Ambulatory Visit: Payer: Medicare Other | Admitting: Family Medicine

## 2024-08-05 ENCOUNTER — Encounter: Payer: Self-pay | Admitting: Family Medicine

## 2024-08-05 VITALS — BP 124/86 | HR 83 | Temp 98.2°F | Ht 71.0 in | Wt 242.8 lb

## 2024-08-05 DIAGNOSIS — Z131 Encounter for screening for diabetes mellitus: Secondary | ICD-10-CM

## 2024-08-05 DIAGNOSIS — E669 Obesity, unspecified: Secondary | ICD-10-CM

## 2024-08-05 DIAGNOSIS — E785 Hyperlipidemia, unspecified: Secondary | ICD-10-CM

## 2024-08-05 DIAGNOSIS — I10 Essential (primary) hypertension: Secondary | ICD-10-CM

## 2024-08-05 LAB — CBC WITH DIFFERENTIAL/PLATELET
Basophils Absolute: 0 K/uL (ref 0.0–0.1)
Basophils Relative: 0.8 % (ref 0.0–3.0)
Eosinophils Absolute: 0.2 K/uL (ref 0.0–0.7)
Eosinophils Relative: 4.5 % (ref 0.0–5.0)
HCT: 47.4 % (ref 39.0–52.0)
Hemoglobin: 16.2 g/dL (ref 13.0–17.0)
Lymphocytes Relative: 24.8 % (ref 12.0–46.0)
Lymphs Abs: 1.2 K/uL (ref 0.7–4.0)
MCHC: 34.2 g/dL (ref 30.0–36.0)
MCV: 89.2 fl (ref 78.0–100.0)
Monocytes Absolute: 0.5 K/uL (ref 0.1–1.0)
Monocytes Relative: 11.1 % (ref 3.0–12.0)
Neutro Abs: 2.8 K/uL (ref 1.4–7.7)
Neutrophils Relative %: 58.8 % (ref 43.0–77.0)
Platelets: 200 K/uL (ref 150.0–400.0)
RBC: 5.32 Mil/uL (ref 4.22–5.81)
RDW: 13.5 % (ref 11.5–15.5)
WBC: 4.8 K/uL (ref 4.0–10.5)

## 2024-08-05 LAB — COMPREHENSIVE METABOLIC PANEL WITH GFR
ALT: 55 U/L — ABNORMAL HIGH (ref 0–53)
AST: 38 U/L — ABNORMAL HIGH (ref 0–37)
Albumin: 4.5 g/dL (ref 3.5–5.2)
Alkaline Phosphatase: 87 U/L (ref 39–117)
BUN: 15 mg/dL (ref 6–23)
CO2: 25 meq/L (ref 19–32)
Calcium: 9.1 mg/dL (ref 8.4–10.5)
Chloride: 105 meq/L (ref 96–112)
Creatinine, Ser: 1.05 mg/dL (ref 0.40–1.50)
GFR: 71.34 mL/min (ref >60.00)
Glucose, Bld: 111 mg/dL — ABNORMAL HIGH (ref 70–99)
Potassium: 4.2 meq/L (ref 3.5–5.1)
Sodium: 139 meq/L (ref 135–145)
Total Bilirubin: 1.2 mg/dL (ref 0.2–1.2)
Total Protein: 6.8 g/dL (ref 6.0–8.3)

## 2024-08-05 LAB — IBC + FERRITIN
Ferritin: 24.7 ng/mL (ref 22.0–322.0)
Iron: 145 ug/dL (ref 42–165)
Saturation Ratios: 39.2 % (ref 20.0–50.0)
TIBC: 369.6 ug/dL (ref 250.0–450.0)
Transferrin: 264 mg/dL (ref 212.0–360.0)

## 2024-08-05 LAB — HEMOGLOBIN A1C: Hgb A1c MFr Bld: 5.7 % (ref 4.6–6.5)

## 2024-08-05 NOTE — Progress Notes (Signed)
 Phone [phone] In person visit   Subjective:   Randall Turner. is a 73 y.o. 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 (HCC) 11/30/2015    Priority: High   Elevated LFTs 07/17/2019    Priority: Medium    Asthma 08/09/2017    Priority: Medium    Essential hypertension 09/15/2015    Priority: Medium    Hyperglycemia 07/28/2013    Priority: Medium    Hyperlipidemia 07/28/2013    Priority: Medium    Esophageal reflux 07/28/2013    Priority: Medium    Closed fracture of fifth metatarsal bone of right foot 11/24/2018    Priority: Low   Right foot pain 11/24/2018    Priority: Low   History of adenomatous polyp of colon 08/09/2017    Priority: Low    Medications- reviewed and updated Current Outpatient Medications  Medication Sig Dispense Refill   Flaxseed, Linseed, (FLAX SEED OIL) 1000 MG CAPS Take by mouth.     losartan  (COZAAR ) 50 MG tablet TAKE 1 TABLET BY MOUTH DAILY 100 tablet 2   naproxen (NAPROSYN) 250 MG tablet Take 250 mg by mouth as needed.     Omega-3 Fatty Acids (OMEGA-3 FISH OIL PO) Take by mouth.     omeprazole  (PRILOSEC) 20 MG capsule TAKE 1 CAPSULE BY MOUTH ONCE  DAILY 100 capsule 2   OVER THE COUNTER MEDICATION Take 1 tablet by mouth. Mens multivitamin without iron     pravastatin  (PRAVACHOL ) 20 MG tablet TAKE 1 TABLET BY MOUTH DAILY 100 tablet 2   VENTOLIN  HFA 108 (90 Base) MCG/ACT inhaler Inhale 2 puffs into the lungs every 6 (six) hours as needed for wheezing or shortness of breath. 18 g 5   VITAMIN D PO Take by mouth.     No current facility-administered  medications for this visit.     Objective:  BP 124/86 (BP Location: Left Arm, Patient Position: Sitting, Cuff Size: Normal)   Pulse 83   Temp 98.2 F (36.8 C) (Temporal)   Ht 5' 11 (1.803 m)   Wt 242 lb 12.8 oz (110.1 kg)   SpO2 94%   BMI 33.86 kg/m  Gen: NAD, resting comfortably CV: RRR no murmurs rubs or gallops Lungs: CTAB no crackles, wheeze, rhonchi Ext: trace edema Skin: warm, dry     Assessment and Plan   #social update- lost sister in July to dementia- they were not close but obviously this was still very hard on him. She didn't know who he was by the end- only knew her husband  #Ongoing left shoulder pain- improving gradually with time. In October of last year was hard to even pick up glass of water . Declines physical therapy as improving.    #Hemochromatosis-prior followed with Dr. Timmy, now with PCP only S: Ferritin goal under 100.  Has had mild LFT elevation in the past-monitoring as long as remains under 100.  Lab Results  Component Value Date   FERRITIN 24.5 02/06/2024  A/P: hopefully stable- update ferritin today. Continue to monitor and phlebotomize if needed - liver looked pretty good last time other than mildly  elevated bilirubin   #Hypertension S: Controlled on losartan  50 mg BP Readings from Last 3 Encounters:  08/05/24 124/86  02/06/24 122/86  07/29/23 124/82  - down 7 lbs from last visit- trying to be intentional on food choices. Hard with knee and shoulder on exercise A/P: stable- continue current medicines      #Hyperlipidemia S: Compliant with pravastatin  20 mg, fish oil  Lab Results  Component Value Date   CHOL 153 02/06/2024   HDL 50.50 02/06/2024   LDLCALC 61 02/06/2024   LDLDIRECT 87.0 01/16/2019   TRIG 209.0 (H) 02/06/2024   CHOLHDL 3 02/06/2024  A/P: lipids reasonably well controlled - LDL at goal. Triglyceride(s) slightly high but working on weight loss     #Hyperglycemia S: CBGs high in the past.  A1c not elevated. Lab  Results  Component Value Date   HGBA1C 5.5 02/06/2024   HGBA1C 5.5 07/29/2023   HGBA1C 5.5 01/28/2023  A/P: hopefully stable- update a1c today. Continue without meds for now       #Asthma S: No controller.  Sparingly uses albuterol - not needing much A/P: doing well- sparing use- continue to monitor      #GERD # History of Zenker's diverticulum with surgery at [facility] September 2023 S: History of Schatzki's ring. Stays on omeprazole  20 mg A/P: reasonable control- no swallowing issues- continue current medications     #prostate cancer history- history of radioactive seed implants- he reports reassuring psa levels with last urology. He may ask about releaseing to us   Lab Results  Component Value Date   PSA 0.29 02/06/2024   PSA 0.94 09/13/2021   PSA 2.76 11/09/2019    Recommended follow up: Return in about 6 months (around 02/05/2025) for physical or sooner if needed.Schedule b4 you leave.  Lab/Order associations:   ICD-10-CM   1. Essential hypertension  I10 Comprehensive metabolic panel with GFR    CBC with Differential/Platelet    2. Hyperlipidemia, unspecified hyperlipidemia type  E78.5 Comprehensive metabolic panel with GFR    CBC with Differential/Platelet    3. Screening for diabetes mellitus  Z13.1 Hemoglobin A1c    4. Obesity (BMI 30-39.9)  E66.9 Hemoglobin A1c    5. 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  Return precautions advised.  Garnette Lukes, MD

## 2024-08-05 NOTE — Patient Instructions (Addendum)
 Health Maintenance Due  Topic Date Due   Medicare Annual Wellness (AWV)  04/10/2022  You are eligible to schedule your annual wellness visit with our nurse specialist [place].  Please consider scheduling this before you leave today  Please stop by lab before you go If you have mychart- we will send your results within 3 business days of us  receiving them.  If you do not have mychart- we will call you about results within 5 business days of us  receiving them.  *please also note that you will see labs on mychart as soon as they post. I will later go in and write notes on them- will say notes from Dr. Katrinka Cheng job on weight loss- keep up the great work!   Recommended follow up: Return in about 6 months (around 02/05/2025) for physical or sooner if needed.Schedule b4 you leave.

## 2024-08-05 NOTE — Addendum Note (Signed)
 Addended by: KATRINKA GARNETTE KIDD on: 08/05/2024 10:16 AM   Modules accepted: Level of Service

## 2024-08-06 ENCOUNTER — Ambulatory Visit: Payer: Self-pay | Admitting: Family Medicine

## 2024-09-23 ENCOUNTER — Ambulatory Visit (INDEPENDENT_AMBULATORY_CARE_PROVIDER_SITE_OTHER)

## 2024-09-23 DIAGNOSIS — Z23 Encounter for immunization: Secondary | ICD-10-CM | POA: Diagnosis not present

## 2024-09-23 NOTE — Progress Notes (Signed)
 Patient is in office today for a nurse visit for Immunization. Patient Injection was given in the  Right deltoid. Patient tolerated injection well.

## 2024-09-27 ENCOUNTER — Other Ambulatory Visit: Payer: Self-pay | Admitting: Family Medicine

## 2025-02-11 ENCOUNTER — Encounter: Admitting: Family Medicine

## 2025-02-17 ENCOUNTER — Encounter: Admitting: Family Medicine
